# Patient Record
Sex: Female | Born: 1970
Health system: Southern US, Community
[De-identification: ages and names within clinical notes are randomized; demographics above are authoritative.]

## PROBLEM LIST (undated history)

## (undated) ENCOUNTER — Ambulatory Visit (HOSPITAL_COMMUNITY): Admission: EM | Payer: 59 | Source: Home / Self Care

## (undated) DIAGNOSIS — R011 Cardiac murmur, unspecified: Secondary | ICD-10-CM

## (undated) DIAGNOSIS — D649 Anemia, unspecified: Secondary | ICD-10-CM

## (undated) DIAGNOSIS — G43909 Migraine, unspecified, not intractable, without status migrainosus: Secondary | ICD-10-CM

## (undated) DIAGNOSIS — F329 Major depressive disorder, single episode, unspecified: Secondary | ICD-10-CM

## (undated) DIAGNOSIS — F32A Depression, unspecified: Secondary | ICD-10-CM

## (undated) DIAGNOSIS — I10 Essential (primary) hypertension: Secondary | ICD-10-CM

## (undated) DIAGNOSIS — T7840XA Allergy, unspecified, initial encounter: Secondary | ICD-10-CM

## (undated) HISTORY — DX: Migraine, unspecified, not intractable, without status migrainosus: G43.909

## (undated) HISTORY — DX: Allergy, unspecified, initial encounter: T78.40XA

## (undated) HISTORY — DX: Cardiac murmur, unspecified: R01.1

## (undated) HISTORY — DX: Essential (primary) hypertension: I10

## (undated) HISTORY — DX: Anemia, unspecified: D64.9

## (undated) HISTORY — PX: APPENDECTOMY: SHX54

## (undated) HISTORY — PX: WISDOM TOOTH EXTRACTION: SHX21

---

## 1998-12-08 ENCOUNTER — Inpatient Hospital Stay (HOSPITAL_COMMUNITY): Admission: EM | Admit: 1998-12-08 | Discharge: 1998-12-10 | Payer: Self-pay | Admitting: Emergency Medicine

## 1998-12-08 ENCOUNTER — Encounter (INDEPENDENT_AMBULATORY_CARE_PROVIDER_SITE_OTHER): Payer: Self-pay | Admitting: Specialist

## 1999-06-19 ENCOUNTER — Emergency Department (HOSPITAL_COMMUNITY): Admission: EM | Admit: 1999-06-19 | Discharge: 1999-06-19 | Payer: Self-pay | Admitting: Emergency Medicine

## 2001-03-05 ENCOUNTER — Other Ambulatory Visit: Admission: RE | Admit: 2001-03-05 | Discharge: 2001-03-05 | Payer: Self-pay | Admitting: Obstetrics & Gynecology

## 2002-03-15 ENCOUNTER — Other Ambulatory Visit: Admission: RE | Admit: 2002-03-15 | Discharge: 2002-03-15 | Payer: Self-pay | Admitting: Obstetrics & Gynecology

## 2003-03-20 ENCOUNTER — Other Ambulatory Visit: Admission: RE | Admit: 2003-03-20 | Discharge: 2003-03-20 | Payer: Self-pay | Admitting: Obstetrics & Gynecology

## 2004-02-27 ENCOUNTER — Encounter: Admission: RE | Admit: 2004-02-27 | Discharge: 2004-02-27 | Payer: Self-pay | Admitting: Obstetrics & Gynecology

## 2007-01-03 ENCOUNTER — Emergency Department (HOSPITAL_COMMUNITY): Admission: EM | Admit: 2007-01-03 | Discharge: 2007-01-03 | Payer: Self-pay | Admitting: *Deleted

## 2007-01-09 ENCOUNTER — Emergency Department (HOSPITAL_COMMUNITY): Admission: EM | Admit: 2007-01-09 | Discharge: 2007-01-09 | Payer: Self-pay | Admitting: Emergency Medicine

## 2007-01-23 ENCOUNTER — Encounter: Admission: RE | Admit: 2007-01-23 | Discharge: 2007-01-23 | Payer: Self-pay | Admitting: Neurosurgery

## 2007-06-13 ENCOUNTER — Emergency Department (HOSPITAL_COMMUNITY): Admission: EM | Admit: 2007-06-13 | Discharge: 2007-06-13 | Payer: Self-pay | Admitting: Family Medicine

## 2008-03-22 ENCOUNTER — Emergency Department (HOSPITAL_COMMUNITY): Admission: EM | Admit: 2008-03-22 | Discharge: 2008-03-22 | Payer: Self-pay | Admitting: Emergency Medicine

## 2008-09-18 ENCOUNTER — Ambulatory Visit: Payer: Self-pay | Admitting: General Practice

## 2008-11-15 ENCOUNTER — Ambulatory Visit: Payer: Self-pay | Admitting: Obstetrics and Gynecology

## 2010-08-02 ENCOUNTER — Ambulatory Visit: Payer: Self-pay | Admitting: Unknown Physician Specialty

## 2010-09-24 ENCOUNTER — Ambulatory Visit: Payer: Self-pay | Admitting: Unknown Physician Specialty

## 2010-11-09 ENCOUNTER — Emergency Department (HOSPITAL_COMMUNITY): Payer: 59

## 2010-11-09 ENCOUNTER — Observation Stay (HOSPITAL_COMMUNITY)
Admission: EM | Admit: 2010-11-09 | Discharge: 2010-11-10 | Disposition: A | Discharge status: Home / Self Care | Payer: 59 | Attending: Family Medicine | Admitting: Family Medicine

## 2010-11-09 DIAGNOSIS — R51 Headache: Secondary | ICD-10-CM | POA: Insufficient documentation

## 2010-11-09 DIAGNOSIS — Y92410 Unspecified street and highway as the place of occurrence of the external cause: Secondary | ICD-10-CM | POA: Insufficient documentation

## 2010-11-09 DIAGNOSIS — N289 Disorder of kidney and ureter, unspecified: Secondary | ICD-10-CM | POA: Insufficient documentation

## 2010-11-09 DIAGNOSIS — E860 Dehydration: Secondary | ICD-10-CM | POA: Insufficient documentation

## 2010-11-09 DIAGNOSIS — R55 Syncope and collapse: Principal | ICD-10-CM | POA: Insufficient documentation

## 2010-11-09 DIAGNOSIS — F411 Generalized anxiety disorder: Secondary | ICD-10-CM | POA: Insufficient documentation

## 2010-11-09 DIAGNOSIS — Z79899 Other long term (current) drug therapy: Secondary | ICD-10-CM | POA: Insufficient documentation

## 2010-11-09 LAB — URINALYSIS, ROUTINE W REFLEX MICROSCOPIC
Bilirubin Urine: NEGATIVE
Glucose, UA: NEGATIVE mg/dL
Ketones, ur: NEGATIVE mg/dL
Nitrite: NEGATIVE
Protein, ur: NEGATIVE mg/dL
Specific Gravity, Urine: 1.016 (ref 1.005–1.030)
Urobilinogen, UA: 0.2 mg/dL (ref 0.0–1.0)
pH: 5.5 (ref 5.0–8.0)

## 2010-11-09 LAB — TROPONIN I: Troponin I: <0.3 ng/mL (ref <0.30)

## 2010-11-09 LAB — POCT I-STAT, CHEM 8
BUN: 14 mg/dL (ref 6–23)
Calcium, Ion: 1.13 mmol/L (ref 1.12–1.32)
Chloride: 101 mEq/L (ref 96–112)
Creatinine, Ser: 1.2 mg/dL — ABNORMAL HIGH (ref 0.50–1.10)
Glucose, Bld: 86 mg/dL (ref 70–99)
HCT: 43 % (ref 36.0–46.0)
Hemoglobin: 14.6 g/dL (ref 12.0–15.0)
Potassium: 3.5 mEq/L (ref 3.5–5.1)
Sodium: 139 mEq/L (ref 135–145)
TCO2: 28 mmol/L (ref 0–100)

## 2010-11-09 LAB — D-DIMER, QUANTITATIVE: D-Dimer, Quant: 0.35 ug/mL-FEU (ref 0.00–0.48)

## 2010-11-09 LAB — CK TOTAL AND CKMB (NOT AT ARMC)
CK, MB: 2.5 ng/mL (ref 0.3–4.0)
Relative Index: 1.8 (ref 0.0–2.5)
Total CK: 141 U/L (ref 7–177)

## 2010-11-09 LAB — POCT PREGNANCY, URINE: Preg Test, Ur: NEGATIVE

## 2010-11-09 LAB — URINE MICROSCOPIC-ADD ON

## 2010-11-10 DIAGNOSIS — R55 Syncope and collapse: Secondary | ICD-10-CM

## 2010-11-10 LAB — COMPREHENSIVE METABOLIC PANEL
ALT: 16 U/L (ref 0–35)
ALT: 11 U/L (ref 0–35)
AST: 27 U/L (ref 0–37)
AST: 12 U/L (ref 0–37)
Albumin: 4 g/dL (ref 3.5–5.2)
Albumin: 3.3 g/dL — ABNORMAL LOW (ref 3.5–5.2)
Alkaline Phosphatase: 62 U/L (ref 39–117)
Alkaline Phosphatase: 54 U/L (ref 39–117)
BUN: 16 mg/dL (ref 6–23)
BUN: 14 mg/dL (ref 6–23)
CO2: 24 mEq/L (ref 19–32)
CO2: 27 mEq/L (ref 19–32)
Calcium: 9.3 mg/dL (ref 8.4–10.5)
Calcium: 8.7 mg/dL (ref 8.4–10.5)
Chloride: 99 mEq/L (ref 96–112)
Chloride: 103 mEq/L (ref 96–112)
Creatinine, Ser: 1.02 mg/dL (ref 0.50–1.10)
Creatinine, Ser: 0.83 mg/dL (ref 0.50–1.10)
GFR calc Af Amer: >60 mL/min (ref >60)
GFR calc Af Amer: >60 mL/min (ref >60)
GFR calc non Af Amer: 59 mL/min — ABNORMAL LOW (ref >60)
GFR calc non Af Amer: >60 mL/min (ref >60)
Glucose, Bld: 113 mg/dL — ABNORMAL HIGH (ref 70–99)
Glucose, Bld: 93 mg/dL (ref 70–99)
Potassium: 3.6 mEq/L (ref 3.5–5.1)
Potassium: 3.1 mEq/L — ABNORMAL LOW (ref 3.5–5.1)
Sodium: 139 mEq/L (ref 135–145)
Sodium: 138 mEq/L (ref 135–145)
Total Bilirubin: 0.2 mg/dL — ABNORMAL LOW (ref 0.3–1.2)
Total Bilirubin: 0.5 mg/dL (ref 0.3–1.2)
Total Protein: 7.6 g/dL (ref 6.0–8.3)
Total Protein: 6.5 g/dL (ref 6.0–8.3)

## 2010-11-10 LAB — MAGNESIUM
Magnesium: 2.1 mg/dL (ref 1.5–2.5)
Magnesium: 2 mg/dL (ref 1.5–2.5)

## 2010-11-10 LAB — CARDIAC PANEL(CRET KIN+CKTOT+MB+TROPI)
CK, MB: 2.3 ng/mL (ref 0.3–4.0)
CK, MB: 2 ng/mL (ref 0.3–4.0)
CK, MB: 2.1 ng/mL (ref 0.3–4.0)
Relative Index: 1.6 (ref 0.0–2.5)
Relative Index: INVALID (ref 0.0–2.5)
Relative Index: 1.8 (ref 0.0–2.5)
Total CK: 142 U/L (ref 7–177)
Total CK: 99 U/L (ref 7–177)
Total CK: 118 U/L (ref 7–177)
Troponin I: <0.3 ng/mL (ref <0.30)
Troponin I: <0.3 ng/mL (ref <0.30)
Troponin I: <0.3 ng/mL (ref <0.30)
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
## 2011-07-30 ENCOUNTER — Ambulatory Visit: Payer: Self-pay | Admitting: Unknown Physician Specialty

## 2011-07-30 LAB — CREATININE, SERUM
EGFR (African American): >60
EGFR (Non-African Amer.): >60

## 2012-08-12 ENCOUNTER — Encounter: Payer: Self-pay | Admitting: Obstetrics & Gynecology

## 2012-09-02 ENCOUNTER — Encounter: Payer: Self-pay | Admitting: Obstetrics & Gynecology

## 2012-09-02 ENCOUNTER — Ambulatory Visit (INDEPENDENT_AMBULATORY_CARE_PROVIDER_SITE_OTHER): Payer: 59 | Admitting: Obstetrics & Gynecology

## 2012-09-02 VITALS — BP 120/78 | HR 65 | Temp 98.2°F | Ht 64.0 in | Wt 167.8 lb

## 2012-09-02 DIAGNOSIS — Z30430 Encounter for insertion of intrauterine contraceptive device: Secondary | ICD-10-CM

## 2012-09-02 DIAGNOSIS — Z3202 Encounter for pregnancy test, result negative: Secondary | ICD-10-CM

## 2012-09-02 DIAGNOSIS — Z30433 Encounter for removal and reinsertion of intrauterine contraceptive device: Secondary | ICD-10-CM | POA: Insufficient documentation

## 2012-09-02 LAB — POCT URINE PREGNANCY: Preg Test, Ur: NEGATIVE

## 2012-09-02 NOTE — Progress Notes (Deleted)
.   Subjective:     Renee Li is a 42 y.o. female here for a birth control consult.   She had her current Mirena inserted in 2009 and would like a new one placed.  Personal health questionnaire reviewed: {yes/no:9010}.   Gynecologic History No LMP recorded. Patient is not currently having periods (Reason: IUD). Contraception: IUD Last Pap: 2011. Results were: normal Last mammogram: 2011. Results were: normal  Obstetric History OB History   Grav Para Term Preterm Abortions TAB SAB Ect Mult Living                   {Common ambulatory SmartLinks:19316}  Review of Systems {ros; complete:30496}    Objective:    {exam; complete:18323}    Assessment:    Healthy female exam.    Plan:    {plan:19193}

## 2012-09-02 NOTE — Progress Notes (Signed)
IUD Insertion Procedure Note  Pre-operative Diagnosis: Mirena insitu x 5 yrs  Post-operative Diagnosis: same  Indications: contraception  Procedure Details   The risks (including infection, bleeding, pain, and uterine perforation) and benefits of the procedure were explained to the patient and Written informed consent was obtained.    Cervix cleansed with Betadine. The IUD was removed intact.  Uterus sounded to 7 cm. IUD inserted without difficulty. String visible and trimmed. Patient tolerated procedure well.  An informal pelvic U/S confirmed appropiate positioning    Condition: Stable  Complications: None  Plan:  The patient was advised to call for any fever or for prolonged or severe pain or bleeding. She was advised to use OTC acetaminophen as needed for mild to moderate pain.

## 2012-09-02 NOTE — Patient Instructions (Addendum)
Levonorgestrel intrauterine device (IUD) What is this medicine? LEVONORGESTREL IUD (LEE voe nor jes trel) is a contraceptive (birth control) device. The device is placed inside the uterus by a healthcare professional. It is used to prevent pregnancy and can also be used to treat heavy bleeding that occurs during your period. Depending on the device, it can be used for 3 to 5 years. This medicine may be used for other purposes; ask your health care provider or pharmacist if you have questions. What should I tell my health care provider before I take this medicine? They need to know if you have any of these conditions: -abnormal Pap smear -cancer of the breast, uterus, or cervix -diabetes -endometritis -genital or pelvic infection now or in the past -have more than one sexual partner or your partner has more than one partner -heart disease -history of an ectopic or tubal pregnancy -immune system problems -IUD in place -liver disease or tumor -problems with blood clots or take blood-thinners -use intravenous drugs -uterus of unusual shape -vaginal bleeding that has not been explained -an unusual or allergic reaction to levonorgestrel, other hormones, silicone, or polyethylene, medicines, foods, dyes, or preservatives -pregnant or trying to get pregnant -breast-feeding How should I use this medicine? This device is placed inside the uterus by a health care professional. Talk to your pediatrician regarding the use of this medicine in children. Special care may be needed. Overdosage: If you think you have taken too much of this medicine contact a poison control center or emergency room at once. NOTE: This medicine is only for you. Do not share this medicine with others. What if I miss a dose? This does not apply. What may interact with this medicine? Do not take this medicine with any of the following medications: -amprenavir -bosentan -fosamprenavir This medicine may also interact with  the following medications: -aprepitant -barbiturate medicines for inducing sleep or treating seizures -bexarotene -griseofulvin -medicines to treat seizures like carbamazepine, ethotoin, felbamate, oxcarbazepine, phenytoin, topiramate -modafinil -pioglitazone -rifabutin -rifampin -rifapentine -some medicines to treat HIV infection like atazanavir, indinavir, lopinavir, nelfinavir, tipranavir, ritonavir -St. John's wort -warfarin This list may not describe all possible interactions. Give your health care provider a list of all the medicines, herbs, non-prescription drugs, or dietary supplements you use. Also tell them if you smoke, drink alcohol, or use illegal drugs. Some items may interact with your medicine. What should I watch for while using this medicine? Visit your doctor or health care professional for regular check ups. See your doctor if you or your partner has sexual contact with others, becomes HIV positive, or gets a sexual transmitted disease. This product does not protect you against HIV infection (AIDS) or other sexually transmitted diseases. You can check the placement of the IUD yourself by reaching up to the top of your vagina with clean fingers to feel the threads. Do not pull on the threads. It is a good habit to check placement after each menstrual period. Call your doctor right away if you feel more of the IUD than just the threads or if you cannot feel the threads at all. The IUD may come out by itself. You may become pregnant if the device comes out. If you notice that the IUD has come out use a backup birth control method like condoms and call your health care provider. Using tampons will not change the position of the IUD and are okay to use during your period. What side effects may I notice from receiving this medicine?   Side effects that you should report to your doctor or health care professional as soon as possible: -allergic reactions like skin rash, itching or  hives, swelling of the face, lips, or tongue -fever, flu-like symptoms -genital sores -high blood pressure -no menstrual period for 6 weeks during use -pain, swelling, warmth in the leg -pelvic pain or tenderness -severe or sudden headache -signs of pregnancy -stomach cramping -sudden shortness of breath -trouble with balance, talking, or walking -unusual vaginal bleeding, discharge -yellowing of the eyes or skin Side effects that usually do not require medical attention (report to your doctor or health care professional if they continue or are bothersome): -acne -breast pain -change in sex drive or performance -changes in weight -cramping, dizziness, or faintness while the device is being inserted -headache -irregular menstrual bleeding within first 3 to 6 months of use -nausea This list may not describe all possible side effects. Call your doctor for medical advice about side effects. You may report side effects to FDA at 1-800-FDA-1088. Where should I keep my medicine? This does not apply. NOTE: This sheet is a summary. It may not cover all possible information. If you have questions about this medicine, talk to your doctor, pharmacist, or health care provider.  2013, [employer]. (05/01/2011 1:54:04 PM)  

## 2012-11-18 ENCOUNTER — Ambulatory Visit: Payer: 59 | Admitting: Obstetrics & Gynecology

## 2013-04-09 ENCOUNTER — Encounter (HOSPITAL_COMMUNITY): Payer: Self-pay | Admitting: *Deleted

## 2013-04-09 ENCOUNTER — Encounter (HOSPITAL_COMMUNITY): Payer: Self-pay | Admitting: Emergency Medicine

## 2013-04-09 ENCOUNTER — Inpatient Hospital Stay (HOSPITAL_COMMUNITY)
Admission: AD | Admit: 2013-04-09 | Discharge: 2013-04-12 | DRG: 885 | Disposition: A | Discharge status: Home / Self Care | Payer: 59 | Source: Intra-hospital | Attending: Psychiatry | Admitting: Psychiatry

## 2013-04-09 ENCOUNTER — Emergency Department (HOSPITAL_COMMUNITY)
Admission: EM | Admit: 2013-04-09 | Discharge: 2013-04-09 | Disposition: A | Discharge status: Other Acute Inpatient Hospital | Payer: 59 | Attending: Emergency Medicine | Admitting: Emergency Medicine

## 2013-04-09 DIAGNOSIS — Z79899 Other long term (current) drug therapy: Secondary | ICD-10-CM

## 2013-04-09 DIAGNOSIS — F332 Major depressive disorder, recurrent severe without psychotic features: Principal | ICD-10-CM | POA: Diagnosis present

## 2013-04-09 DIAGNOSIS — Z0289 Encounter for other administrative examinations: Secondary | ICD-10-CM | POA: Insufficient documentation

## 2013-04-09 DIAGNOSIS — R413 Other amnesia: Secondary | ICD-10-CM | POA: Diagnosis present

## 2013-04-09 DIAGNOSIS — F32A Depression, unspecified: Secondary | ICD-10-CM

## 2013-04-09 DIAGNOSIS — F3289 Other specified depressive episodes: Secondary | ICD-10-CM

## 2013-04-09 DIAGNOSIS — R634 Abnormal weight loss: Secondary | ICD-10-CM | POA: Diagnosis present

## 2013-04-09 DIAGNOSIS — R45851 Suicidal ideations: Secondary | ICD-10-CM

## 2013-04-09 DIAGNOSIS — F329 Major depressive disorder, single episode, unspecified: Secondary | ICD-10-CM

## 2013-04-09 DIAGNOSIS — I10 Essential (primary) hypertension: Secondary | ICD-10-CM | POA: Diagnosis present

## 2013-04-09 HISTORY — DX: Major depressive disorder, single episode, unspecified: F32.9

## 2013-04-09 HISTORY — DX: Depression, unspecified: F32.A

## 2013-04-09 LAB — CBC WITH DIFFERENTIAL/PLATELET
Basophils Absolute: 0 10*3/uL (ref 0.0–0.1)
Basophils Relative: 0 % (ref 0–1)
Eosinophils Absolute: 0 10*3/uL (ref 0.0–0.7)
Eosinophils Relative: 0 % (ref 0–5)
HCT: 44 % (ref 36.0–46.0)
MCH: 30.7 pg (ref 26.0–34.0)
MCHC: 35.2 g/dL (ref 30.0–36.0)
Monocytes Absolute: 0.6 10*3/uL (ref 0.1–1.0)
Monocytes Relative: 6 % (ref 3–12)
Neutro Abs: 7.6 10*3/uL (ref 1.7–7.7)
Platelets: 222 10*3/uL (ref 150–400)
RDW: 12.7 % (ref 11.5–15.5)

## 2013-04-09 LAB — BASIC METABOLIC PANEL
BUN: 11 mg/dL (ref 6–23)
Calcium: 9.8 mg/dL (ref 8.4–10.5)
Chloride: 98 mEq/L (ref 96–112)
Creatinine, Ser: 1.05 mg/dL (ref 0.50–1.10)
GFR calc Af Amer: 74 mL/min — ABNORMAL LOW (ref >90)
GFR calc non Af Amer: 64 mL/min — ABNORMAL LOW (ref >90)
Sodium: 137 mEq/L (ref 135–145)

## 2013-04-09 LAB — SALICYLATE LEVEL: Salicylate Lvl: <2 mg/dL — ABNORMAL LOW (ref 2.8–20.0)

## 2013-04-09 LAB — RAPID URINE DRUG SCREEN, HOSP PERFORMED
Amphetamines: NOT DETECTED
Opiates: NOT DETECTED

## 2013-04-09 LAB — ETHANOL: Alcohol, Ethyl (B): <11 mg/dL (ref 0–11)

## 2013-04-09 LAB — ACETAMINOPHEN LEVEL: Acetaminophen (Tylenol), Serum: <15 ug/mL (ref 10–30)

## 2013-04-09 MED ORDER — TRAZODONE HCL 50 MG PO TABS
50.0000 mg | ORAL_TABLET | Freq: Every evening | ORAL | Status: DC | PRN
  Administered 2013-04-11: 50 mg via ORAL
  Filled 2013-04-09: qty 1

## 2013-04-09 MED ORDER — MAGNESIUM HYDROXIDE 400 MG/5ML PO SUSP: 30.0000 mL | Freq: Every day | ORAL | Status: DC | PRN

## 2013-04-09 MED ORDER — BUPROPION HCL ER (XL) 150 MG PO TB24: 150.0000 mg | ORAL_TABLET | Freq: Every day | ORAL | Status: DC

## 2013-04-09 MED ORDER — HYDROCHLOROTHIAZIDE 25 MG PO TABS: 25.0000 mg | ORAL_TABLET | Freq: Every day | ORAL | Status: DC

## 2013-04-09 MED ORDER — ACETAMINOPHEN 325 MG PO TABS
650.0000 mg | ORAL_TABLET | Freq: Four times a day (QID) | ORAL | Status: DC | PRN
  Administered 2013-04-10 (×2): 650 mg via ORAL
  Filled 2013-04-09 (×2): qty 2

## 2013-04-09 MED ORDER — ATENOLOL 50 MG PO TABS: 50.0000 mg | ORAL_TABLET | Freq: Every day | ORAL | Status: DC

## 2013-04-09 MED ORDER — ZOLPIDEM TARTRATE 10 MG PO TABS: 10.0000 mg | ORAL_TABLET | Freq: Every evening | ORAL | Status: DC | PRN

## 2013-04-09 MED ORDER — ALUM & MAG HYDROXIDE-SIMETH 200-200-20 MG/5ML PO SUSP: 30.0000 mL | ORAL | Status: DC | PRN

## 2013-04-09 NOTE — ED Provider Notes (Signed)
CSN: [id number]     Arrival date & time 04/09/13  4098 History   First MD Initiated Contact with Patient 04/09/13 [phone]     Chief Complaint  Patient presents with  . Medical Clearance   (Consider location/radiation/quality/duration/timing/severity/associated sxs/prior Treatment) HPI  This a 42 year old female with history of depression who presents with suicidal ideation. Patient reports stressors at home including a recent separation from her husband. She is taking Wellbutrin and Ambien. Patient states that last night "my head tell me to take all my Ambien again asleep." She also states that her head tell vertical and to a lake. She states that she does not want her herself and flushed all of her Ambien down the toilet. She called a crisis hotline and contracted for safety with a woman named Bridgette. Part of her safety plan was to come to the ER she had recurrence of these thoughts period.  Patient states that she's had suicidal thoughts in the past but has had no suicidal gestures. She denies any alcohol or drug use. She is. Emotional and crying in triage. She states that she lost her husband and child and does not want to herself.  Past Medical History  Diagnosis Date  . Hypertension   . Heart murmur    Past Surgical History  Procedure Laterality Date  . Appendectomy     Family History  Problem Relation Age of Onset  . Cancer Mother     throat  . Stroke Maternal Grandmother   . Diabetes Maternal Grandmother   . Cancer Maternal Grandfather     prostate  . Heart disease Paternal Grandmother    History  Substance Use Topics  . Smoking status: Never Smoker   . Smokeless tobacco: Not on file  . Alcohol Use: No   OB History   Grav Para Term Preterm Abortions TAB SAB Ect Mult Living                 Review of Systems  Constitutional: Negative for fever.  Respiratory: Negative for cough, chest tightness and shortness of breath.   Cardiovascular: Negative for chest pain.   Gastrointestinal: Negative for nausea, vomiting and abdominal pain.  Genitourinary: Negative for dysuria.  Musculoskeletal: Negative for back pain.  Skin: Negative for wound.  Neurological: Negative for headaches.  Psychiatric/Behavioral: Positive for suicidal ideas.  All other systems reviewed and are negative.    Allergies  Aleve and Ibuprofen  Home Medications   Current Outpatient Rx  Name  Route  Sig  Dispense  Refill  . atenolol (TENORMIN) 50 MG tablet   Oral   Take 50 mg by mouth daily.         Marland Kitchen buPROPion (WELLBUTRIN XL) 150 MG 24 hr tablet   Oral   Take 150 mg by mouth daily.         . hydrochlorothiazide (HYDRODIURIL) 25 MG tablet   Oral   Take 25 mg by mouth daily.         Marland Kitchen zolpidem (AMBIEN) 10 MG tablet   Oral   Take 10 mg by mouth at bedtime as needed for sleep.          BP 135/98  Pulse 90  Temp(Src) 99.5 F (37.5 C) (Oral)  Resp 20  SpO2 99% Physical Exam  Nursing note and vitals reviewed. Constitutional: She is oriented to person, place, and time. No distress.  Tearful  HENT:  Head: Normocephalic and atraumatic.  Cardiovascular: Normal rate, regular rhythm and normal heart sounds.  No murmur heard. Pulmonary/Chest: Effort normal. No respiratory distress. She has no wheezes.  Neurological: She is alert and oriented to person, place, and time.  Skin: Skin is warm and dry.  Psychiatric:  Rational with reasonable thought content    ED Course  Procedures (including critical care time) Labs Review Labs Reviewed  CBC WITH DIFFERENTIAL  BASIC METABOLIC PANEL  URINE RAPID DRUG SCREEN (HOSP PERFORMED)  ETHANOL  SALICYLATE LEVEL  ACETAMINOPHEN LEVEL   Imaging Review No results found.  EKG Interpretation   None       MDM  No diagnosis found.  Patient presents with acute suicidal ideation in the setting of recent stressors. She is followed through with her safety plan and is presented to the ED because of her current thoughts  of suicidal ideation. She has not made any suicidal gestures. She is currently here voluntarily and is seeking help. She is very tearful.  She physically appears well. Psych orders were placed and medical screening labs obtained.  Patient is currently here voluntarily but I discussed with her that she would need to have IVC papers taken out on her if she attempts to leave.   Shon Baton, MD 04/09/13 [phone]

## 2013-04-09 NOTE — ED Notes (Addendum)
Patient's silver colored heart necklace placed in cup in patient's black purse in belonging bag.

## 2013-04-09 NOTE — ED Notes (Signed)
Report given to [employer] pt to go to room 43 at 1015

## 2013-04-09 NOTE — ED Notes (Signed)
States she doesn't have A hallucinations, her mind races.
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
## 2013-04-09 NOTE — ED Notes (Signed)
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 She states she really does not want to hurt self, Stress due to separation with husband, states she flushed her Ambien down the toilet last night to keep from taking them. States she loves her children and husband and wants help, pt will stay voluntarily but understands that if she tries to leave the EDP will take out papers. Pt emotional and crying in triage.

## 2013-04-09 NOTE — ED Notes (Signed)
Patient has one belonging bag at desk

## 2013-04-09 NOTE — BH Assessment (Signed)
Assessment Note  Renee [name].   Pt tearful, flat affect, Ox 3, speech clear but soft, anxious.    What led to ED visit  Pt contacted hotline and was recommended to come to ED for evaluation.  Pt had feelings of suicide and had an initial plan to OD but then poured meds down the toilet and called therapist.  Pt has other meds in house.  Pt can't reliably contract for safety at this time and is scared to be alone.  "I don't know what I will do if I go home.  I know suicide is a sin but I am tired of being like this."  Trigger  Pt mother died 2 years ago and she was POA.  She has other sisters and an older sister who has not spoken to pt since funeral.  Mother was in Hospice and had a DNR and did not want to be on a feeding tube.  She kept pulling it out.  Pt gave OK for them stop life support treatment.  Pt blames self for mother dying although mother wanted to be disconnected.  3 months ago, pt spouse separated from her after 3 years of marriage.  Pt said he told me "Stop it.  You need to snap out this.  Just change your life."  "He never understood, never offered support and so he left and now I am alone."    Pt has 41 year old daughter who is currently with her father.  "I don't think I would kill myself because of what it would do to my child, but I can't take this anymore.  I need help."  No SA, AVH or HI related issues currently or past hx of.  Pt in talk therapy with Dr. Lady Gary.    Recommendation  Pt may benefit from intpx due to inability to contract reliably for safety and having a plan to overdose earlier in the day.  Pt has numerous stressors contributing to current safety issues.  Psychiatry to round and determine dispo.  Axis I: Generalized Anxiety Disorder and Major Depression, Recurrent severe Axis II: Deferred Axis III:  Past Medical History  Diagnosis Date  . Hypertension   . Heart murmur    Axis IV: other psychosocial or environmental  problems, problems related to social environment and problems with primary support group Axis V: 41-50 serious symptoms  Past Medical History:  Past Medical History  Diagnosis Date  . Hypertension   . Heart murmur     Past Surgical History  Procedure Laterality Date  . Appendectomy      Family History:  Family History  Problem Relation Age of Onset  . Cancer Mother     throat  . Stroke Maternal Grandmother   . Diabetes Maternal Grandmother   . Cancer Maternal Grandfather     prostate  . Heart disease Paternal Grandmother     Social History:  reports that she has never smoked. She does not have any smokeless tobacco history on file. She reports that she does not drink alcohol or use illicit drugs.  Additional Social History:  Alcohol / Drug Use Pain Medications: na Prescriptions: na Over the Counter: na History of alcohol / drug use?: No history of alcohol / drug abuse  CIWA: CIWA-Ar BP: 133/87 mmHg Pulse Rate: 66 COWS:    Allergies:  Allergies  Allergen Reactions  . Aleve [Naproxen]     unknown  . Ibuprofen     unknown  Home Medications:  (Not in a hospital admission)  OB/GYN Status:  No LMP recorded. Patient is not currently having periods (Reason: IUD).  General Assessment Data Location of Assessment: [facility] Is this a Tele or Face-to-Face Assessment?: Face-to-Face Is this an Initial Assessment or a Re-assessment for this encounter?: Initial Assessment Living Arrangements: Alone Can pt return to current living arrangement?: Yes Admission Status: Voluntary Is patient capable of signing voluntary admission?: Yes Transfer from: Acute Hospital Referral Source: MD  Medical Screening Exam [facility] Walk-in ONLY) Medical Exam completed: Yes  [facility] Crisis Care Plan Living Arrangements: Alone Name of Psychiatrist: Dr. Lady Gary Name of Therapist: Dr. Lady Gary  Education Status Is patient currently in school?: No Current Grade: na Highest grade of school  patient has completed: na Name of school: na Contact person: na  Risk to self Suicidal Ideation: Yes-Currently Present Suicidal Intent: No-Not Currently/Within Last 6 Months Is patient at risk for suicide?: Yes Suicidal Plan?: No-Not Currently/Within Last 6 Months Access to Means: Yes Specify Access to Suicidal Means: has access to medication What has been your use of drugs/alcohol within the last 12 months?: na Previous Attempts/Gestures: No How many times?: 0 Other Self Harm Risks: na Triggers for Past Attempts: Other (Comment) (depression) Intentional Self Injurious Behavior: None Family Suicide History: No Recent stressful life event(s): Trauma (Comment) (POA for mother; pulled plug in respect for DNR;) Persecutory voices/beliefs?: No Depression: Yes Depression Symptoms: Insomnia;Tearfulness;Isolating;Fatigue;Guilt;Loss of interest in usual pleasures;Feeling worthless/self pity;Feeling angry/irritable Substance abuse history and/or treatment for substance abuse?: No Suicide prevention information given to non-admitted patients: Not applicable  Risk to Others Homicidal Ideation: No Thoughts of Harm to Others: No Current Homicidal Intent: No Current Homicidal Plan: No Access to Homicidal Means: No Identified Victim: na History of harm to others?: No Assessment of Violence: None Noted Violent Behavior Description: cooperative Does patient have access to weapons?: No Criminal Charges Pending?: No Does patient have a court date: No  Psychosis Hallucinations: None noted Delusions: None noted  Mental Status Report Appear/Hygiene: Disheveled Eye Contact: Fair Motor Activity: Unremarkable Speech: Soft;Logical/coherent Level of Consciousness: Alert;Crying Mood: Depressed;Anxious;Ashamed/humiliated;Despair;Sad;Worthless, low self-esteem Affect: Anxious;Depressed;Sad Anxiety Level: Severe Thought Processes: Coherent Judgement: Impaired Orientation:  Person;Place;Situation;Appropriate for developmental age Obsessive Compulsive Thoughts/Behaviors: Minimal  Cognitive Functioning Concentration: Decreased Memory: Recent Intact;Remote Intact IQ: Average Insight: Poor Impulse Control: Poor Appetite: Fair Weight Loss: 0 Weight Gain: 0 Sleep: Decreased Total Hours of Sleep: 4 Vegetative Symptoms: None  ADLScreening [facility] Assessment Services) Patient's cognitive ability adequate to safely complete daily activities?: Yes Patient able to express need for assistance with ADLs?: Yes Independently performs ADLs?: Yes (appropriate for developmental age)  Prior Inpatient Therapy Prior Inpatient Therapy: No Prior Therapy Dates: na Prior Therapy Facilty/Provider(s): na Reason for Treatment: na  Prior Outpatient Therapy Prior Outpatient Therapy: Yes Prior Therapy Dates: current Prior Therapy Facilty/Provider(s): Dr. Lady Gary Reason for Treatment: talk therapy and med mgt  ADL Screening (condition at time of admission) Patient's cognitive ability adequate to safely complete daily activities?: Yes Is the patient deaf or have difficulty hearing?: No Does the patient have difficulty seeing, even when wearing glasses/contacts?: No Does the patient have difficulty concentrating, remembering, or making decisions?: No Patient able to express need for assistance with ADLs?: Yes Does the patient have difficulty dressing or bathing?: No Independently performs ADLs?: Yes (appropriate for developmental age) Does the patient have difficulty walking or climbing stairs?: No Weakness of Legs: None Weakness of Arms/Hands: None  Home Assistive Devices/Equipment Home Assistive Devices/Equipment: None  Therapy Consults (  therapy consults require a physician order) PT Evaluation Needed: No OT Evalulation Needed: No SLP Evaluation Needed: No Abuse/Neglect Assessment (Assessment to be complete while patient is alone) Physical Abuse: Denies Verbal Abuse:  Yes, past (Comment) (ex husband is not understanding of depression) Sexual Abuse: Denies Exploitation of patient/patient's resources: Denies Self-Neglect: Denies Values / Beliefs Cultural Requests During Hospitalization: None Spiritual Requests During Hospitalization: None Consults Spiritual Care Consult Needed: No Social Work Consult Needed: No Merchant navy officer (For Healthcare) Advance Directive: Patient does not have advance directive Pre-existing out of facility DNR order (yellow form or pink MOST form): No Nutrition Screen- [facility] Patient's home diet: Regular  Additional Information 1:1 In Past 12 Months?: No CIRT Risk: No Elopement Risk: No Does patient have medical clearance?: Yes     Disposition:  Disposition Initial Assessment Completed for this Encounter: Yes Disposition of Patient: Inpatient treatment program Type of inpatient treatment program: Adult  On Site Evaluation by:   Reviewed with Physician:    Titus Mould, Eppie Gibson 04/09/2013 11:06 AM

## 2013-04-09 NOTE — ED Provider Notes (Signed)
Pt admitted to [facility].  Pt later stating she feels improved as good plan for outpt f/u, no longer suicidal, but in speaking w/ Dr. Wilkie Aye this afternoon and psych team that evaluated her, they did not feel comfortable with d/c.   1. Depression with suicidal ideation      Shanna Cisco, MD 04/09/13 2257

## 2013-04-09 NOTE — BH Assessment (Addendum)
[facility] Assessment Progress Note 04/09/13. 1830.  Went in to have pt sign admission paperwork and pt now reports she does not want to be admitted.  Pt states she is feeling better and would like to be discharged home.  Spoke with Barth Kirks at [facility] and she will ask oncoming extender to complete telepsych to evaluate pt request. Daleen Squibb, LCSW  04/09/13.  2030.  Discussed pt with Dr Kandyce Rud of [facility], who spoke with pt and reports that pt appears to have a reasonable safety plan at this time.  DR Kandyce Rud is requesting a [facility] extender reevaluate pt for another opinion.  This was passed on to Laverle Hobby at [facility]. Daleen Squibb, LCSW

## 2013-04-09 NOTE — Consult Note (Signed)
[facility] Face-to-Face Psychiatry Consult   Reason for Consult:  ED Referral Referring Physician: ED Providers/Dr Horton Diannia Ruder Arnette Renee [name].  Assessment: AXIS I:  Depression with suicidal ideation AXIS II:  Deferred AXIS III:   Past Medical History  Diagnosis Date  . Hypertension   . Heart murmur    AXIS IV:  problems with primary support group AXIS V:  21-30 behavior considerably influenced by delusions or hallucinations OR serious impairment in judgment, communication OR inability to function in almost all areas  Plan:  Recommend psychiatric Inpatient admission when medically cleared.  Subjective:   Renee Li is a 42 y.o. female patient admitted with intrusive suicidal thoughts since her husband told her their marriage was over last nite.She has hx of depression offf meds until 3 months ago and an RX for [place] which she thought about taking all of last nite but flushed them to control her intrusive thoughts.Marland Kitchen  HPI:  As above HPI Elements:   Context:  Per subjective and also see ED provider notes.  Past Psychiatric History: Past Medical History  Diagnosis Date  . Hypertension   . Heart murmur     reports that she has never smoked. She does not have any smokeless tobacco history on file. She reports that she does not drink alcohol or use illicit drugs. Family History  Problem Relation Age of Onset  . Cancer Mother     throat  . Stroke Maternal Grandmother   . Diabetes Maternal Grandmother   . Cancer Maternal Grandfather     prostate  . Heart disease Paternal Grandmother    Family History Substance Abuse: No Family Supports: No Living Arrangements: Alone Can pt return to current living arrangement?: Yes Abuse/Neglect [facility]) Physical Abuse: Denies Verbal Abuse: Yes, past (Comment) (ex husband is not understanding of depression) Sexual Abuse: Denies Allergies:   Allergies  Allergen Reactions  . Aleve [Naproxen]     unknown  . Ibuprofen      unknown    ACT Assessment Complete:  Yes:    Educational Status    Risk to Self: Risk to self Suicidal Ideation: Yes-Currently Present Suicidal Intent: No-Not Currently/Within Last 6 Months Is patient at risk for suicide?: Yes Suicidal Plan?: No-Not Currently/Within Last 6 Months Access to Means: Yes Specify Access to Suicidal Means: has access to medication What has been your use of drugs/alcohol within the last 12 months?: na Previous Attempts/Gestures: No How many times?: 0 Other Self Harm Risks: na Triggers for Past Attempts: Other (Comment) (depression) Intentional Self Injurious Behavior: None Family Suicide History: No Recent stressful life event(s): Trauma (Comment) (POA for mother; pulled plug in respect for DNR;) Persecutory voices/beliefs?: No Depression: Yes Depression Symptoms: Insomnia;Tearfulness;Isolating;Fatigue;Guilt;Loss of interest in usual pleasures;Feeling worthless/self pity;Feeling angry/irritable Substance abuse history and/or treatment for substance abuse?: No Suicide prevention information given to non-admitted patients: Not applicable  Risk to Others: Risk to Others Homicidal Ideation: No Thoughts of Harm to Others: No Current Homicidal Intent: No Current Homicidal Plan: No Access to Homicidal Means: No Identified Victim: na History of harm to others?: No Assessment of Violence: None Noted Violent Behavior Description: cooperative Does patient have access to weapons?: No Criminal Charges Pending?: No Does patient have a court date: No  Abuse: Abuse/Neglect Assessment (Assessment to be complete while patient is alone) Physical Abuse: Denies Verbal Abuse: Yes, past (Comment) (ex husband is not understanding of depression) Sexual Abuse: Denies Exploitation of patient/patient's resources: Denies Self-Neglect: Denies  Prior Inpatient Therapy:  Prior Inpatient Therapy Prior Inpatient Therapy: No Prior Therapy Dates: na Prior Therapy  Facilty/Provider(s): na Reason for Treatment: na  Prior Outpatient Therapy: Prior Outpatient Therapy Prior Outpatient Therapy: Yes Prior Therapy Dates: current Prior Therapy Facilty/Provider(s): Dr. Lady Gary Reason for Treatment: talk therapy and med mgt  Additional Information: Additional Information 1:1 In Past 12 Months?: No CIRT Risk: No Elopement Risk: No Does patient have medical clearance?: Yes                  Objective: Blood pressure 133/87, pulse 66, temperature 98.1 F (36.7 C), temperature source Oral, resp. rate 18, SpO2 92.00%.There is no weight on file to calculate BMI. Results for orders placed during the hospital encounter of 04/09/13 (from the past 72 hour(s))  CBC WITH DIFFERENTIAL     Status: Abnormal   Collection Time    04/09/13  9:45 AM      Result Value Range   WBC 9.7  4.0 - 10.5 K/uL   RBC 5.05  3.87 - 5.11 MIL/uL   Hemoglobin 15.5 (*) 12.0 - 15.0 g/dL   HCT 41.3  24.4 - 01.0 %   MCV 87.1  78.0 - 100.0 fL   MCH 30.7  26.0 - 34.0 pg   MCHC 35.2  30.0 - 36.0 g/dL   RDW 27.2  53.6 - 64.4 %   Platelets 222  150 - 400 K/uL   Neutrophils Relative % 79 (*) 43 - 77 %   Neutro Abs 7.6  1.7 - 7.7 K/uL   Lymphocytes Relative 15  12 - 46 %   Lymphs Abs 1.4  0.7 - 4.0 K/uL   Monocytes Relative 6  3 - 12 %   Monocytes Absolute 0.6  0.1 - 1.0 K/uL   Eosinophils Relative 0  0 - 5 %   Eosinophils Absolute 0.0  0.0 - 0.7 K/uL   Basophils Relative 0  0 - 1 %   Basophils Absolute 0.0  0.0 - 0.1 K/uL  BASIC METABOLIC PANEL     Status: Abnormal   Collection Time    04/09/13  9:45 AM      Result Value Range   Sodium 137  135 - 145 mEq/L   Potassium 3.3 (*) 3.5 - 5.1 mEq/L   Chloride 98  96 - 112 mEq/L   CO2 29  19 - 32 mEq/L   Glucose, Bld 101 (*) 70 - 99 mg/dL   BUN 11  6 - 23 mg/dL   Creatinine, Ser 0.34  0.50 - 1.10 mg/dL   Calcium 9.8  8.4 - 74.2 mg/dL   GFR calc non Af Amer 64 (*) >90 mL/min   GFR calc Af Amer 74 (*) >90 mL/min   Comment:  (NOTE)     The eGFR has been calculated using the CKD EPI equation.     This calculation has not been validated in all clinical situations.     eGFR's persistently <90 mL/min signify possible Chronic Kidney     Disease.  ETHANOL     Status: None   Collection Time    04/09/13  9:45 AM      Result Value Range   Alcohol, Ethyl (B) <11  0 - 11 mg/dL   Comment:            LOWEST DETECTABLE LIMIT FOR     SERUM ALCOHOL IS 11 mg/dL     FOR MEDICAL PURPOSES ONLY  SALICYLATE LEVEL     Status: Abnormal   Collection  Time    04/09/13  9:45 AM      Result Value Range   Salicylate Lvl <2.0 (*) 2.8 - 20.0 mg/dL  ACETAMINOPHEN LEVEL     Status: None   Collection Time    04/09/13  9:45 AM      Result Value Range   Acetaminophen (Tylenol), Serum <15.0  10 - 30 ug/mL   Comment:            THERAPEUTIC CONCENTRATIONS VARY     SIGNIFICANTLY. A RANGE OF 10-30     ug/mL MAY BE AN EFFECTIVE     CONCENTRATION FOR MANY PATIENTS.     HOWEVER, SOME ARE BEST TREATED     AT CONCENTRATIONS OUTSIDE THIS     RANGE.     ACETAMINOPHEN CONCENTRATIONS     >150 ug/mL AT 4 HOURS AFTER     INGESTION AND >50 ug/mL AT 12     HOURS AFTER INGESTION ARE     OFTEN ASSOCIATED WITH TOXIC     REACTIONS.   Labs are reviewed and are pertinent for sligght decrease in K+  No current facility-administered medications for this encounter.   Current Outpatient Prescriptions  Medication Sig Dispense Refill  . atenolol (TENORMIN) 50 MG tablet Take 50 mg by mouth daily.      Marland Kitchen buPROPion (WELLBUTRIN XL) 150 MG 24 hr tablet Take 150 mg by mouth daily.      . hydrochlorothiazide (HYDRODIURIL) 25 MG tablet Take 25 mg by mouth daily.      Marland Kitchen zolpidem (AMBIEN) 10 MG tablet Take 10 mg by mouth at bedtime as needed for sleep.        Psychiatric Specialty Exam:     Blood pressure 133/87, pulse 66, temperature 98.1 F (36.7 C), temperature source Oral, resp. rate 18, SpO2 92.00%.There is no weight on file to calculate BMI.  General  Appearance: Disheveled  Eye Contact::  Poor  Speech:  Slow  Volume:  Decreased  Mood:  Depressed/Tearful  Affect:  Flat  Thought Process:  obsessive thoughts of suicide  Orientation:  Full (Time, Place, and Person)  Thought Content:  Obsessions  Suicidal Thoughts:  Yes.  with intent/plan  Homicidal Thoughts:  No  Memory:  Negative  Judgement:  Impaired  Insight:  Lacking  Psychomotor Activity:  Decreased  Concentration:  Fair  Recall:  Fair  Akathisia:  NA  Handed:  Right  AIMS (if indicated):   na  Assets:  Financial Resources/Insurance  Sleep:   none past 24 hours   Treatment Plan Summary: Dr Elsie Saas recommends and pt agrees to accept inpatient therapy  Court Joy 04/09/2013 3:02 PM  Case discussed with a physician extender and rReviewed the information documented and agree with the treatment plan.  Naika Noto,JANARDHAHA R. 04/16/2013 12:19 PM

## 2013-04-09 NOTE — Tx Team (Signed)
Initial Interdisciplinary Treatment Plan  PATIENT STRENGTHS: (choose at least two) Ability for insight Average or above average intelligence Capable of independent living General fund of knowledge  PATIENT STRESSORS: Marital or family conflict   PROBLEM LIST: Problem List/Patient Goals Date to be addressed Date deferred Reason deferred Estimated date of resolution  Depression 04/09/13     Suicidal Ideation 04/09/13                                                DISCHARGE CRITERIA:  Ability to meet basic life and health needs Improved stabilization in mood, thinking, and/or behavior Verbal commitment to aftercare and medication compliance  PRELIMINARY DISCHARGE PLAN: Attend aftercare/continuing care group Return to previous living arrangement  PATIENT/FAMIILY INVOLVEMENT: This treatment plan has been presented to and reviewed with the patient, Renee Li, and/or family member, .  The patient and family have been given the opportunity to ask questions and make suggestions.  [name], [name] 04/09/2013, 11:12 PM

## 2013-04-10 ENCOUNTER — Encounter (HOSPITAL_COMMUNITY): Payer: Self-pay | Admitting: Psychiatry

## 2013-04-10 DIAGNOSIS — F411 Generalized anxiety disorder: Secondary | ICD-10-CM

## 2013-04-10 DIAGNOSIS — F332 Major depressive disorder, recurrent severe without psychotic features: Principal | ICD-10-CM

## 2013-04-10 MED ORDER — BUPROPION HCL ER (XL) 150 MG PO TB24
150.0000 mg | ORAL_TABLET | Freq: Two times a day (BID) | ORAL | Status: DC
  Administered 2013-04-10 – 2013-04-12 (×4): 150 mg via ORAL
  Filled 2013-04-10 (×8): qty 1

## 2013-04-10 MED ORDER — POTASSIUM CHLORIDE CRYS ER 10 MEQ PO TBCR
10.0000 meq | EXTENDED_RELEASE_TABLET | Freq: Every day | ORAL | Status: DC
  Administered 2013-04-10 – 2013-04-12 (×3): 10 meq via ORAL
  Filled 2013-04-10 (×5): qty 1

## 2013-04-10 MED ORDER — BUPROPION HCL ER (XL) 150 MG PO TB24
150.0000 mg | ORAL_TABLET | Freq: Every day | ORAL | Status: DC
  Administered 2013-04-10: 150 mg via ORAL
  Filled 2013-04-10 (×3): qty 1

## 2013-04-10 MED ORDER — HYDROCHLOROTHIAZIDE 25 MG PO TABS
25.0000 mg | ORAL_TABLET | Freq: Every day | ORAL | Status: DC
  Administered 2013-04-10 – 2013-04-12 (×3): 25 mg via ORAL
  Filled 2013-04-10 (×5): qty 1

## 2013-04-10 MED ORDER — ATENOLOL 50 MG PO TABS
50.0000 mg | ORAL_TABLET | Freq: Every day | ORAL | Status: DC
  Administered 2013-04-10 – 2013-04-12 (×3): 50 mg via ORAL
  Filled 2013-04-10 (×5): qty 1

## 2013-04-10 NOTE — Progress Notes (Signed)
NUTRITION ASSESSMENT  Pt identified as at risk on the Malnutrition Screen Tool  INTERVENTION: 1. Educated patient on the importance of nutrition and encouraged intake of food and beverages. 2. Discussed weight goals. 3. Supplements: none at this time.  NUTRITION DIAGNOSIS: Unintentional weight loss related to sub-optimal intake as evidenced by pt report.   Goal: Pt to meet >/= 90% of their estimated nutrition needs.  Monitor:  PO intake  Assessment:  Patient admitted with depression and SI.  States that her appetite has not been very good but has been trying to eat 3 meals daily.  UBW 167 lbs about 3 months ago.  22 lb loss in the past 3 months.  Currently states that she is eating well.  42 y.o. female  Height: Ht Readings from Last 1 Encounters:  04/09/13 5\' 4"  (1.626 m)    Weight: Wt Readings from Last 1 Encounters:  04/09/13 145 lb (65.772 kg)    Weight Hx: Wt Readings from Last 10 Encounters:  04/09/13 145 lb (65.772 kg)  09/02/12 167 lb 12.8 oz (76.114 kg)    BMI:  Body mass index is 24.88 kg/(m^2). Pt meets criteria for wnl based on current BMI.  Estimated Nutritional Needs: Kcal: 25-30 kcal/kg Protein: > 1 gram protein/kg Fluid: 1 ml/kcal  Diet Order: General Pt is also offered choice of unit snacks mid-morning and mid-afternoon.  Pt is eating as desired.   Lab results and medications reviewed.   Oran Rein, RD, LDN Clinical Inpatient Dietitian Pager:  [phone] Weekend and after hours pager:  [phone]

## 2013-04-10 NOTE — [facility] Group Notes (Signed)
  [facility] LCSW Group Therapy Note  04/10/2013 2:15-3:00  Type of Therapy and Topic:  Group Therapy: Feelings Around D/C & Establishing a Supportive Framework  Participation Level:  Active   Mood:   Appropriate  Description of Group:   What is a supportive framework? What does it look like feel like and how do I discern it from and unhealthy non-supportive network? Learn how to cope when supports are not helpful and don't support you. Discuss what to do when your family/friends are not supportive.The main focus of today's process group was to identify the patient's current support system and decide on other supports that can be put in place. An emphasis was placed on using counselor, doctor, therapy groups, 12-step groups, and problem-specific support groups to expand supports. There was also an extensive discussion about what constitutes a healthy support versus an unhealthy supports.  Therapeutic Goals Addressed in Processing Group: 1. Patient will identify one healthy supportive network that they can use at discharge. 2. Patient will identify one factor of a supportive framework and how to tell it from an unhealthy network. 3. Patient able to identify one coping skill to use when they do not have positive supports from others. 4. Patient will demonstrate ability to communicate their needs through discussion and/or role plays.   Summary of Patient Progress:  Renee Li was observed with appropriate and engaged mood during group session.  She processed her struggle with utilizing her positive supports for fear of judgment.  Pt appears to be gaining insight into how to more effectively utilize positive supports at DC.      Yong Grieser, LCSWA
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
## 2013-04-10 NOTE — BHH Suicide Risk Assessment (Signed)
Suicide Risk Assessment  Admission Assessment     Nursing information obtained from:    Demographic factors:    Current Mental Status:    Loss Factors:    Historical Factors:    Risk Reduction Factors:     CLINICAL FACTORS:   Depression:   Hopelessness Impulsivity Insomnia Unstable or Poor Therapeutic Relationship  COGNITIVE FEATURES THAT CONTRIBUTE TO RISK:  Closed-mindedness Polarized thinking Thought constriction (tunnel vision)    SUICIDE RISK:   Mild:  Suicidal ideation of limited frequency, intensity, duration, and specificity.  There are no identifiable plans, no associated intent, mild dysphoria and related symptoms, good self-control (both objective and subjective assessment), few other risk factors, and identifiable protective factors, including available and accessible social support.  PLAN OF CARE: Please see admission note for more details.    I certify that inpatient services furnished can reasonably be expected to improve the patient's condition.  Kayshaun Polanco T. 04/10/2013, 10:54 AM

## 2013-04-10 NOTE — Progress Notes (Signed)
Adult Psychoeducational Group Note  Date:  04/10/2013 Time:  1:15PM  Group Topic/Focus:  Therapeutic Activity  Participation Level:  Active  Participation Quality:  Appropriate  Affect:  Appropriate  Cognitive:  Appropriate  Insight: Appropriate  Engagement in Group:  Engaged  Modes of Intervention:  Discussion  Additional Comments:  Pt attended group focusing on opening up and communicating with peers. Pt played, "Question Newman Pies," tossing a ball around with peers and answering random questions (ex. "If you had three wishes, what would you wish for?") Pt was active throughout group   Renee Li K 04/10/2013, 1:57 PM

## 2013-04-10 NOTE — Progress Notes (Signed)
Adult Psychoeducational Group Note  Date:  04/10/2013 Time:  9:41 PM  Group Topic/Focus:  Wrap-Up Group:   The focus of this group is to help patients review their daily goal of treatment and discuss progress on daily workbooks.  Participation Level:  Active  Participation Quality:  Appropriate, Attentive and Sharing  Affect:  Appropriate  Cognitive:  Appropriate  Insight: Improving  Engagement in Group:  Engaged  Modes of Intervention:  Education  Additional Comments:  Pt stated she had a great day and she learned a lot from attending groups today.  Pt stated she realized she needs to make time for herself, and do something everyday that is a treat for her.  Pt reported upon discharge she would like to find people to connect with and reach out to others. Pt stated she learned in group to try to touch one person everyday.  Pt reported her husband to be a health support for her.   Stephan Minister [facility] 04/10/2013, 9:41 PM
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
## 2013-04-10 NOTE — Progress Notes (Signed)
Adult Psychoeducational Group Note  Date:  04/10/2013 Time:  1015  Group Topic/Focus:  Healthy Communication:   The focus of this group is to discuss communication, barriers to communication, as well as healthy ways to communicate with others.  Participation Level:  Active  Participation Quality:  Appropriate  Affect:  Appropriate  Cognitive:  Alert and Appropriate  Insight: Appropriate and Good  Engagement in Group:  Supportive  Modes of Intervention:  Education  Additional Comments:  Attentive, sharing  Earline Mayotte 04/10/2013, 2:40 PM

## 2013-04-10 NOTE — Progress Notes (Signed)
Patient ID: Renee Li, female   DOB: Jan 07, 1971, 42 y.o.   MRN: [id number] D. Patient presents with depressed mood, affect anxious. Patient states '' I was really hoping that I'd be discharged, you see I don't think I need to be here anymore, I mean I know I told the doctor that I thought this would be good for me but now that I'm here and I've had some sleep I feel so much better. I think just being around other people and having touch helped '' Patient minimizing reports of depression, rating her depression at 3 /10 on depression scale, however mood presents more depressed. She denies any SI HI A/V Hallucinations at this time. A. Support and encouragement provided .Medications given as ordered R. Patient calm and cooperative, attending unit programming. No further voiced concerns at this time . Will continue to monitor q 15 minutes for safety.

## 2013-04-10 NOTE — Progress Notes (Signed)
Patient ID: Renee Li, female   DOB: 01-Aug-1970, 42 y.o.   MRN: [id number] Pt did not attend processing group on [address] today as MHT reports pt to program on [address]. Carney Bern, LCSW

## 2013-04-11 NOTE — B[facility] Group Notes (Signed)
[facility] LCSW Aftercare Discharge Planning Group Note   04/11/2013 8:45 AM  Participation Quality:  Alert, Appropriate and Oriented  Mood/Affect:  Bright, cheerful  Depression Rating:  1  Anxiety Rating:  0  Thoughts of Suicide:  Pt denies SI/HI  Will you contract for safety?   Yes  Current AVH:  Pt denies  Plan for Discharge/Comments:  Pt attended discharge planning group and actively participated in group.  CSW provided pt with today's workbook.  Patient is currently linked with a counselor, would like referral for a psychiatrist.   Transportation Means: Pt reports access to transportation  Supports: No supports mentioned at this time  Loleta Books, Theresia Majors 04/11/2013 8:34 AM

## 2013-04-11 NOTE — Progress Notes (Signed)
Pt asleep in bed at this time. Unlabored, even breathing. No signs of distress or complaints at this time. q 15 min safety checks. Pt remains safe on unit at this time  

## 2013-04-11 NOTE — Progress Notes (Signed)
Patient ID: Renee Li, female   DOB: 1970-06-12, 42 y.o.   MRN: [id number]  D: Patient presents with depressed and anxious mood. Patient brightens on approach. Pt. Denies SI/HI and A/V Hallucinations. Patient does not report any pain or discomfort at this time. Patient reports that she slept well and her depression and hopelessness is at a 1/10. Patient reports that after discharge she will, "think positive thoughts, stop projecting negative, stay in touch/ connected to positive people/support.   A: Support and encouragement provided to the patient. Scheduled medication given to patient per physician's orders.  R: Patient is receptive and cooperative. Patient is seen in the milieu interacting with staff and other patients. Patient appears nervous but does not complain of any anxiety. Q15 minute checks are maintained for safety.

## 2013-04-11 NOTE — Progress Notes (Signed)
Adult Psychoeducational Group Note  Date:  04/11/2013 Time:  9:54 PM  Group Topic/Focus:  Wrap-Up Group:   The focus of this group is to help patients review their daily goal of treatment and discuss progress on daily workbooks.  Participation Level:  Active  Participation Quality:  Appropriate and Attentive  Affect:  Appropriate  Cognitive:  Alert and Appropriate  Insight: Appropriate  Engagement in Group:  Engaged  Modes of Intervention:  Discussion, Education and Support  Additional Comments:  Pt rated her day at a 10 out of 10. Pt said her goal was to engage more with others, and she did so. Pt said she had great groups and had a long conversation with her roommate. Pt is excited and feels confident about discharging.   Renee Li 04/11/2013, 9:54 PM

## 2013-04-11 NOTE — Tx Team (Signed)
Interdisciplinary Treatment Plan Update (Adult)  Date: 04/11/2013  Time Reviewed:  9:45 AM  Progress in Treatment: Attending groups: Yes Participating in groups:  Yes Taking medication as prescribed:  Yes Tolerating medication:  Yes Family/Significant othe contact made: CSW assessing  Patient understands diagnosis:  Yes Discussing patient identified problems/goals with staff:  Yes Medical problems stabilized or resolved:  Yes Denies suicidal/homicidal ideation: Yes Issues/concerns per patient self-inventory:  Yes Other:  New problem(s) identified: N/A  Discharge Plan or Barriers: CSW assessing for appropriate referrals.  Reason for Continuation of Hospitalization: Anxiety Depression Medication Stabilization  Comments: N/A  Estimated length of stay: 1 day, d/c tomorrow  For review of initial/current patient goals, please see plan of care.  Attendees: Patient:     Family:     Physician:  Dr. Javier Glazier 04/11/2013 10:00 AM   Nursing:   Quintella Reichert, RN 04/11/2013 10:00 AM   Clinical Social Worker:  Reyes Ivan, LCSW 04/11/2013 10:00 AM   Other: Marzetta Board, RN 04/11/2013 10:00 AM   Other:  Elizbeth Squires, care coordination 04/11/2013 10:00 AM   Other:  Juline Patch, LCSW 04/11/2013 10:00 AM   Other:  Neill Loft, RN 04/11/2013 10:01 AM   Other:    Other:    Other:    Other:    Other:    Other:     Scribe for Treatment Team:   Carmina Miller, 04/11/2013 10:00 AM

## 2013-04-11 NOTE — Progress Notes (Signed)
[facility] MD Progress Note  04/11/2013 12:58 PM Renee Li  MRN:  [id number] Subjective:  Patient was admitted for increased symptoms of depression and suicidal ideation and reportedly patient has death in the family. Her depression has been ongoing despite going to counseling at [facility] for a year and with her present provider, Dr. Lady Gary. She is currently taking Wellbutrin 150 mg daily for depression, has been on Lexapro in the past. The patient's 44 yo daughter with her husband, went away for Christmas and she and her ex got into an argument on Christmas Eve. She spent Christmas alone and became overwhelmed and suicidal. Patient stated that she needed to be at home by the time her 44 years old comes home and need to make nice meals which she always does for her. Patient minimizes her symptoms of depression anxiety and contracts for safety at this time.  Diagnosis:   DSM5: Schizophrenia Disorders:   Obsessive-Compulsive Disorders:   Trauma-Stressor Disorders:   Substance/Addictive Disorders:   Depressive Disorders:  Major Depressive Disorder - Severe (296.23)  Axis I: Major Depression, Recurrent severe  ADL's:  Impaired  Sleep: Fair  Appetite:  Fair  Suicidal Ideation:  Patient contracts for safety in the hospital Homicidal Ideation:  Denied AEB (as evidenced by):  Psychiatric Specialty Exam: ROS  Blood pressure 119/83, pulse 81, temperature 97.9 F (36.6 C), temperature source Oral, resp. rate 16, height 5\' 4"  (1.626 m), weight 65.772 kg (145 lb).Body mass index is 24.88 kg/(m^2).  General Appearance: Guarded  Eye Contact::  Minimal  Speech:  Clear and Coherent  Volume:  Decreased  Mood:  Anxious, Depressed and Hopeless  Affect:  Depressed and Flat  Thought Process:  Goal Directed and Intact  Orientation:  Full (Time, Place, and Person)  Thought Content:  Rumination  Suicidal Thoughts:  Yes.  without intent/plan  Homicidal Thoughts:  No  Memory:  Immediate;   Fair   Judgement:  Impaired  Insight:  Lacking  Psychomotor Activity:  Psychomotor Retardation  Concentration:  Fair  Recall:  Fair  Akathisia:  NA  Handed:  Right  AIMS (if indicated):     Assets:  Communication Skills Desire for Improvement Housing Physical Health Resilience Social Support Transportation  Sleep:  Number of Hours: 6.25   Current Medications: Current Facility-Administered Medications  Medication Dose Route Frequency Provider Last Rate Last Dose  . acetaminophen (TYLENOL) tablet 650 mg  650 mg Oral Q6H PRN Court Joy, PA-C   650 mg at 04/10/13 1356  . alum & mag hydroxide-simeth (MAALOX/MYLANTA) 200-200-20 MG/5ML suspension 30 mL  30 mL Oral Q4H PRN Court Joy, PA-C      . atenolol (TENORMIN) tablet 50 mg  50 mg Oral Daily Sanjuana Kava, NP   50 mg at 04/11/13 0834  . buPROPion (WELLBUTRIN XL) 24 hr tablet 150 mg  150 mg Oral BID Nanine Means, NP   150 mg at 04/11/13 0834  . hydrochlorothiazide (HYDRODIURIL) tablet 25 mg  25 mg Oral Daily Sanjuana Kava, NP   25 mg at 04/11/13 0834  . magnesium hydroxide (MILK OF MAGNESIA) suspension 30 mL  30 mL Oral Daily PRN Court Joy, PA-C      . potassium chloride (K-DUR,KLOR-CON) CR tablet 10 mEq  10 mEq Oral Daily Nanine Means, NP   10 mEq at 04/11/13 0836  . traZODone (DESYREL) tablet 50 mg  50 mg Oral QHS PRN,MR X 1 Court Joy, PA-C  Lab Results:  Results for orders placed during the hospital encounter of 04/09/13 (from the past 48 hour(s))  URINE RAPID DRUG SCREEN (HOSP PERFORMED)     Status: None   Collection Time    04/09/13  1:47 PM      Result Value Range   Opiates NONE DETECTED  NONE DETECTED   Cocaine NONE DETECTED  NONE DETECTED   Benzodiazepines NONE DETECTED  NONE DETECTED   Amphetamines NONE DETECTED  NONE DETECTED   Tetrahydrocannabinol NONE DETECTED  NONE DETECTED   Barbiturates NONE DETECTED  NONE DETECTED   Comment:            DRUG SCREEN FOR MEDICAL PURPOSES     ONLY.  IF  CONFIRMATION IS NEEDED     FOR ANY PURPOSE, NOTIFY LAB     WITHIN 5 DAYS.                LOWEST DETECTABLE LIMITS     FOR URINE DRUG SCREEN     Drug Class       Cutoff (ng/mL)     Amphetamine      1000     Barbiturate      200     Benzodiazepine   200     Tricyclics       300     Opiates          300     Cocaine          300     THC              50    Physical Findings: AIMS: Facial and Oral Movements Muscles of Facial Expression: None, normal Lips and Perioral Area: None, normal Jaw: None, normal Tongue: None, normal,Extremity Movements Upper (arms, wrists, hands, fingers): None, normal Lower (legs, knees, ankles, toes): None, normal, Trunk Movements Neck, shoulders, hips: None, normal, Overall Severity Severity of abnormal movements (highest score from questions above): None, normal Incapacitation due to abnormal movements: None, normal Patient's awareness of abnormal movements (rate only patient's report): No Awareness, Dental Status Current problems with teeth and/or dentures?: No Does patient usually wear dentures?: No  CIWA:    COWS:     Treatment Plan Summary: Daily contact with patient to assess and evaluate symptoms and progress in treatment Medication management  Plan: Treatment Plan/Recommendations:   1. Admit for crisis management and stabilization. 2. Medication management to reduce current symptoms to base line and improve the patient's overall level of functioning. 3. Treat health problems as indicated. 4. Develop treatment plan to decrease risk of relapse upon discharge and to reduce the need for readmission. 5. Psycho-social education regarding relapse prevention and self care. 6. Health care follow up as needed for medical problems. 7. Restart home medications where appropriate.   Medical Decision Making Problem Points:  Established problem, worsening (2), New problem, with no additional work-up planned (3), Review of last therapy session (1) and  Review of psycho-social stressors (1) Data Points:  Review or order clinical lab tests (1) Review or order medicine tests (1) Review of medication regiment & side effects (2) Review of new medications or change in dosage (2)  I certify that inpatient services furnished can reasonably be expected to improve the patient's condition.   Detrick Dani,JANARDHAHA R. 04/11/2013, 12:58 PM

## 2013-04-11 NOTE — [facility] Group Notes (Signed)
[facility] LCSW Group Therapy  04/11/2013 2:59 PM  Type of Therapy:  Group Therapy  Participation Level:  Active  Participation Quality:  Appropriate, Attentive and Sharing  Affect:  Appropriate and Depressed  Cognitive:  Alert, Appropriate and Oriented  Insight:  Developing/Improving  Engagement in Therapy:  Developing/Improving  Modes of Intervention:  Activity, Discussion, Exploration, Problem-solving, Socialization and Support  Summary of Progress/Problems:: Pt identified obstacles faced currently and processed barriers involved in overcoming these obstacles. Pt identified steps necessary for overcoming these obstacles and explored motivation (internal and external) for facing these difficulties head on. Pt further identified one area of concern in their lives and chose a goal to focus on for today.    Patient was an active participant in group.  She is able to identify goals that she would like to complete upon discharge, and acknowledged that she can be her own obstacle.  Patient acknowledged that she needs to become more flexible with her family as she often becomes disappointed and angry at her family members if they do not support.  Patient demonstrated insight that she has not asked them for support and that she needs to communicate her needs to them.  Patient was receptive to feedback from peers that encouraged her to stop enabling her child's behaviors that often leads her to feel more depressed and frustration.    Pervis Hocking 04/11/2013, 2:59 PM

## 2013-04-11 NOTE — Progress Notes (Signed)
Adult Psychoeducational Group Note  Date:  04/11/2013 Time:  11:00am Group Topic/Focus:  Wellness Toolbox:   The focus of this group is to discuss various aspects of wellness, balancing those aspects and exploring ways to increase the ability to experience wellness.  Patients will create a wellness toolbox for use upon discharge.  Participation Level:  Active  Participation Quality:  Appropriate and Attentive  Affect:  Appropriate  Cognitive:  Alert and Appropriate  Insight: Appropriate  Engagement in Group:  Engaged  Modes of Intervention:  Discussion and Education  Additional Comments: Pt attended and participated in group. Discussion was on wellness. Pt stated wellness means good nutrition, being in a stable mood, exercise and having balance with work and family.  Shelly Bombard D 04/11/2013, 4:04 PM

## 2013-04-11 NOTE — Progress Notes (Signed)
Patient ID: Renee Li, female   DOB: 09/19/1970, 42 y.o.   MRN: [id number] D: Pt. Reports depression at "1" of 10. Pt. Reports being here helpful "I got to rest, soothers, learned coping skills, playing cards, been engaged in support group" A: Writer introduced self, provided emotional support, reviewed medications and encouraged client to follow up with discharge plans to attend support groups. Staff will monitor q48min for safety. R: Pt. Is safe on the unit. Pt. Attended group.

## 2013-04-12 DIAGNOSIS — R45851 Suicidal ideations: Secondary | ICD-10-CM

## 2013-04-12 MED ORDER — HYDROCHLOROTHIAZIDE 25 MG PO TABS: 25.0000 mg | ORAL_TABLET | Freq: Every day | ORAL | Status: DC

## 2013-04-12 MED ORDER — TRAZODONE HCL 50 MG PO TABS: 50.0000 mg | ORAL_TABLET | Freq: Every evening | ORAL | Status: DC | PRN

## 2013-04-12 MED ORDER — BUPROPION HCL ER (XL) 150 MG PO TB24: 150.0000 mg | ORAL_TABLET | Freq: Two times a day (BID) | ORAL | Status: DC

## 2013-04-12 MED ORDER — ATENOLOL 50 MG PO TABS: 50.0000 mg | ORAL_TABLET | Freq: Every day | ORAL | Status: DC

## 2013-04-12 MED ORDER — POTASSIUM CHLORIDE CRYS ER 10 MEQ PO TBCR: 10.0000 meq | EXTENDED_RELEASE_TABLET | Freq: Every day | ORAL | Status: DC

## 2013-04-12 NOTE — [facility] Suicide Risk Assessment (Signed)
[facility] INPATIENT:  Family/Significant Other Suicide Prevention Education  Suicide Prevention Education:  Education Completed; Conchetta Lamia (husband) has been identified by the patient as the family member/significant other with whom the patient will be residing, and identified as the person(s) who will aid the patient in the event of a mental health crisis (suicidal ideations/suicide attempt).  With written consent from the patient, the family member/significant other has been provided the following suicide prevention education, prior to the and/or following the discharge of the patient.  The suicide prevention education provided includes the following:  Suicide risk factors  Suicide prevention and interventions  National Suicide Hotline telephone number  [facility] assessment telephone number  [facility] Emergency Assistance 911  [facility] and/or Residential Mobile Crisis Unit telephone number  Request made of family/significant other to:  Remove weapons (e.g., guns, rifles, knives), all items previously/currently identified as safety concern.    Remove drugs/medications (over-the-counter, prescriptions, illicit drugs), all items previously/currently identified as a safety concern.  The family member/significant other verbalizes understanding of the suicide prevention education information provided.  The family member/significant other agrees to remove the items of safety concern listed above.  Pervis Hocking 04/12/2013, 10:08 AM

## 2013-04-12 NOTE — Progress Notes (Cosign Needed)
Pt d/c to home as she is transported to [facility] via security to pick up her car. D/c instructions, rx's, and suicide prevention information given and reviewed. Pt verbalizes understanding and denies s.i.

## 2013-04-12 NOTE — Discharge Summary (Signed)
Physician Discharge Summary Note  Patient:  Renee Li is an 42 y.o., female MRN:  [id number] DOB:  06/25/1970 Patient phone:  [phone] (home)  Patient address:   [address] [place] [address],   Date of Admission:  04/09/2013 Date of Discharge: 04/12/2013  Reason for Admission:  Depression with suicide plan to overdose  Discharge Diagnoses: Active Problems:   Depression with suicidal ideation  Review of Systems  Constitutional: Negative.   HENT: Negative.   Eyes: Negative.   Respiratory: Negative.   Cardiovascular: Negative.   Gastrointestinal: Negative.   Genitourinary: Negative.   Musculoskeletal: Negative.   Skin: Negative.   Neurological: Negative.   Endo/Heme/Allergies: Negative.   Psychiatric/Behavioral: The patient is nervous/anxious.     DSM5:  Depressive Disorders:  Major Depressive Disorder - Severe (296.23)  Axis Diagnosis:   AXIS I:  Major Depression, Recurrent severe AXIS II:  Deferred AXIS III:   Past Medical History  Diagnosis Date  . Hypertension   . Heart murmur   . Mental disorder   . Depression    AXIS IV:  other psychosocial or environmental problems, problems related to social environment and problems with primary support group AXIS V:  61-70 mild symptoms  Level of Care:  OP  Hospital Course:  ON admission: Patient's depression began a few years ago when she was caring for her mother who had stage 4 lung cancer, died 2 years ago. Her sisters gave her a hard time because she had placed her mother in Hospice the last few weeks to die peacefully. Her depression has been ongoing despite going to counseling at [facility] for a year and with her present provider, Dr. Lady Gary. She is currently taking Wellbutrin 150 mg daily for depression, has been on Lexapro in the past. Her husband is frustrated that she is not the person he married and upset that she is still depressed, he left 3 months ago but they continued to work on  their issues. The patient's 6 yo daughter by her husband went away for Christmas and she and her ex got into an argument on Christmas Eve. A previous note states he told her the marriage was over but she does not endorse this. She spent Christmas alone and became overwhelmed and suicidal. No alcohol or drug use. Decreased appetite with a 15 pound weight loss in the past three months with some memory loss and racing thoughts related to anxiety.  During hospitalization:   Medications managed--Wellbutrin 150 mg daily for depression increased to BID and Trazodone 50 mg at bedtime for sleep issues started.  Her blood pressure medications were continued with the addition of potassium 10 mEq daily for supplementation.  Renee Li states when she is upset or depressed in the future, she will use her soothers to calm herself--singing, positive thoughts, call a support person.  She feels the groups have really worked for her---she attended and participated in therapy.  She denied suicidal/homicidal ideations and auditory/visual hallucinations, follow-up appointments encouraged to attend, Rx given.  Renee Li is mentally and physically stable for discharge.  Consults:  None  Significant Diagnostic Studies:  labs: completed, reviewed, stable  Discharge Vitals:   Blood pressure 119/83, pulse 81, temperature 97.9 F (36.6 C), temperature source Oral, resp. rate 16, height 5\' 4"  (1.626 m), weight 65.772 kg (145 lb). Body mass index is 24.88 kg/(m^2). Lab Results:   Results for orders placed during the hospital encounter of 04/09/13 (from the past 72 hour(s))  CBC WITH DIFFERENTIAL  Status: Abnormal   Collection Time    04/09/13  9:45 AM      Result Value Range   WBC 9.7  4.0 - 10.5 K/uL   RBC 5.05  3.87 - 5.11 MIL/uL   Hemoglobin 15.5 (*) 12.0 - 15.0 g/dL   HCT 86.5  78.4 - 69.6 %   MCV 87.1  78.0 - 100.0 fL   MCH 30.7  26.0 - 34.0 pg   MCHC 35.2  30.0 - 36.0 g/dL   RDW 29.5  28.4 - 13.2 %   Platelets 222  150 -  400 K/uL   Neutrophils Relative % 79 (*) 43 - 77 %   Neutro Abs 7.6  1.7 - 7.7 K/uL   Lymphocytes Relative 15  12 - 46 %   Lymphs Abs 1.4  0.7 - 4.0 K/uL   Monocytes Relative 6  3 - 12 %   Monocytes Absolute 0.6  0.1 - 1.0 K/uL   Eosinophils Relative 0  0 - 5 %   Eosinophils Absolute 0.0  0.0 - 0.7 K/uL   Basophils Relative 0  0 - 1 %   Basophils Absolute 0.0  0.0 - 0.1 K/uL  BASIC METABOLIC PANEL     Status: Abnormal   Collection Time    04/09/13  9:45 AM      Result Value Range   Sodium 137  135 - 145 mEq/L   Potassium 3.3 (*) 3.5 - 5.1 mEq/L   Chloride 98  96 - 112 mEq/L   CO2 29  19 - 32 mEq/L   Glucose, Bld 101 (*) 70 - 99 mg/dL   BUN 11  6 - 23 mg/dL   Creatinine, Ser 4.40  0.50 - 1.10 mg/dL   Calcium 9.8  8.4 - 10.2 mg/dL   GFR calc non Af Amer 64 (*) >90 mL/min   GFR calc Af Amer 74 (*) >90 mL/min   Comment: (NOTE)     The eGFR has been calculated using the CKD EPI equation.     This calculation has not been validated in all clinical situations.     eGFR's persistently <90 mL/min signify possible Chronic Kidney     Disease.  ETHANOL     Status: None   Collection Time    04/09/13  9:45 AM      Result Value Range   Alcohol, Ethyl (B) <11  0 - 11 mg/dL   Comment:            LOWEST DETECTABLE LIMIT FOR     SERUM ALCOHOL IS 11 mg/dL     FOR MEDICAL PURPOSES ONLY  SALICYLATE LEVEL     Status: Abnormal   Collection Time    04/09/13  9:45 AM      Result Value Range   Salicylate Lvl <2.0 (*) 2.8 - 20.0 mg/dL  ACETAMINOPHEN LEVEL     Status: None   Collection Time    04/09/13  9:45 AM      Result Value Range   Acetaminophen (Tylenol), Serum <15.0  10 - 30 ug/mL   Comment:            THERAPEUTIC CONCENTRATIONS VARY     SIGNIFICANTLY. A RANGE OF 10-30     ug/mL MAY BE AN EFFECTIVE     CONCENTRATION FOR MANY PATIENTS.     HOWEVER, SOME ARE BEST TREATED     AT CONCENTRATIONS OUTSIDE THIS     RANGE.     ACETAMINOPHEN CONCENTRATIONS     >  150 ug/mL AT 4 HOURS AFTER      INGESTION AND >50 ug/mL AT 12     HOURS AFTER INGESTION ARE     OFTEN ASSOCIATED WITH TOXIC     REACTIONS.  URINE RAPID DRUG SCREEN (HOSP PERFORMED)     Status: None   Collection Time    04/09/13  1:47 PM      Result Value Range   Opiates NONE DETECTED  NONE DETECTED   Cocaine NONE DETECTED  NONE DETECTED   Benzodiazepines NONE DETECTED  NONE DETECTED   Amphetamines NONE DETECTED  NONE DETECTED   Tetrahydrocannabinol NONE DETECTED  NONE DETECTED   Barbiturates NONE DETECTED  NONE DETECTED   Comment:            DRUG SCREEN FOR MEDICAL PURPOSES     ONLY.  IF CONFIRMATION IS NEEDED     FOR ANY PURPOSE, NOTIFY LAB     WITHIN 5 DAYS.                LOWEST DETECTABLE LIMITS     FOR URINE DRUG SCREEN     Drug Class       Cutoff (ng/mL)     Amphetamine      1000     Barbiturate      200     Benzodiazepine   200     Tricyclics       300     Opiates          300     Cocaine          300     THC              50    Physical Findings: AIMS: Facial and Oral Movements Muscles of Facial Expression: None, normal Lips and Perioral Area: None, normal Jaw: None, normal Tongue: None, normal,Extremity Movements Upper (arms, wrists, hands, fingers): None, normal Lower (legs, knees, ankles, toes): None, normal, Trunk Movements Neck, shoulders, hips: None, normal, Overall Severity Severity of abnormal movements (highest score from questions above): None, normal Incapacitation due to abnormal movements: None, normal Patient's awareness of abnormal movements (rate only patient's report): No Awareness, Dental Status Current problems with teeth and/or dentures?: No Does patient usually wear dentures?: No  CIWA:    COWS:     Psychiatric Specialty Exam: See Psychiatric Specialty Exam and Suicide Risk Assessment completed by Attending Physician prior to discharge.  Discharge destination:  Home  Is patient on multiple antipsychotic therapies at discharge:  No   Has Patient had three or more  failed trials of antipsychotic monotherapy by history:  No  Recommended Plan for Multiple Antipsychotic Therapies: NA  Discharge Orders   Future Orders Complete By Expires   Activity as tolerated - No restrictions  As directed    Diet - low sodium heart healthy  As directed        Medication List       Indication   atenolol 50 MG tablet  Commonly known as:  TENORMIN  Take 1 tablet (50 mg total) by mouth daily.   Indication:  High Blood Pressure     buPROPion 150 MG 24 hr tablet  Commonly known as:  WELLBUTRIN XL  Take 1 tablet (150 mg total) by mouth 2 (two) times daily.   Indication:  Major Depressive Disorder     hydrochlorothiazide 25 MG tablet  Commonly known as:  HYDRODIURIL  Take 1 tablet (25 mg total) by mouth daily.  Indication:  High Blood Pressure     potassium chloride 10 MEQ tablet  Commonly known as:  K-DUR,KLOR-CON  Take 1 tablet (10 mEq total) by mouth daily.   Indication:  High Blood Pressure, Low Amount of Potassium in the Blood     traZODone 50 MG tablet  Commonly known as:  DESYREL  Take 1 tablet (50 mg total) by mouth at bedtime as needed and may repeat dose one time if needed for sleep.            Follow-up Information   Follow up with [facility]. (Follow-up with therapist, Calvert Cantor, on 1/2 at 9:00am. )    Contact information:   [address] [place], [place] [address] [phone]      Follow up with [facility]. (For medication management. An appointment with Dr. Lolly Mustache has been scheduled for 1/16 at 10:00am. Please bring completed new patient packet. )    Specialty:  Behavioral Health   Contact information:   [address] [place] [address] [phone]      Follow-up recommendations:  Activity:  as tolerated Diet:  low-sodium heart healthy diet Continue to work your relapse prevention plan Comments:  Take all your medications as prescribed by your mental  healthcare provider. Report any adverse effects and or reactions from your medicines to your outpatient provider promptly. Patient is instructed and cautioned to not engage in alcohol and or illegal drug use while on prescription medicines. In the event of worsening symptoms, patient is instructed to call the crisis hotline, 911 and or go to the nearest ED for appropriate evaluation and treatment of symptoms. Follow-up with your primary care provider for your other medical issues, concerns and or health care needs.  Total Discharge Time:  Greater than 30 minutes.  SignedNanine Means, PMH-NP 04/12/2013, 8:47 AM Agree with assessment and plan Reymundo Poll. Dub Mikes, M.D.

## 2013-04-12 NOTE — Progress Notes (Signed)
The focus of this group is to educate the patient on the purpose and policies of crisis stabilization and provide a format to answer questions about their admission.  The group details unit policies and expectations of patients while admitted.  Patient attended 0900 nurse education orientation group this morning.  Patient listened, appropriate affect, alert, appropriate insight and engagement.  Today patient will work on 3 goals for discharge.  

## 2013-04-12 NOTE — Progress Notes (Signed)
[facility] Adult Case Management Discharge Plan :  Will you be returning to the same living situation after discharge: Yes,  with husband At discharge, do you have transportation home?:Yes,  self.  Do you have the ability to pay for your medications:Yes,  no barriers  Release of information consent forms completed and in the chart;  Patient's signature needed at discharge.  Patient to Follow up at: Follow-up Information   Follow up with [facility]. (Follow-up with therapist, Calvert Cantor, on 1/2 at 9:00am. )    Contact information:   [address] [place], [place] [address] [phone]      Follow up with [facility]. (For medication management. An appointment with Dr. Lolly Mustache has been scheduled for 1/16 at 10:00am. Please bring completed new patient packet. )    Specialty:  Behavioral Health   Contact information:   [address] [place] [address] [phone]      Patient denies SI/HI:   Yes,  denies    Safety Planning and Suicide Prevention discussed:  Yes,  education and resources provided to husband  Pervis Hocking 04/12/2013, 10:56 AM

## 2013-04-12 NOTE — Progress Notes (Cosign Needed)
D) Pt presents as appropriate in mood and affect. Pt is cooperative on approach. Rosie has been positive for all groups and activities with minimal prompting. Pt is working on learning how to isolate less and stay connected to one person each day when she goes home. Pt is also trying to keep her thoughts positive. Pt denies s.i., no c/o pain. A) level 3 obs for safety, support and encouragement provided. Contract for safety. R) Pt is receptive.

## 2013-04-12 NOTE — [facility] Suicide Risk Assessment (Signed)
Suicide Risk Assessment  Discharge Assessment     Demographic Factors:  NA  Mental Status Per Nursing Assessment::   On Admission:     Current Mental Status by Physician: In full contact with reality. Denies suicidal ideas, plans or intent. States Christmas has been very stressful. mother died 2 years ago. States she authorized her mother being disconnected. states the family continues to give her a hard time. She is taking medications but still angry at herself, her mother for smocking, the family for giving her a hard time about her decision. . Because of her "mood swings," her husband separated. Got very overwhelmed started thinking about hurting herself "crazy thoughts." States she was thinking about OD ing. She called the crisis line. She worked a Water engineer. Her MD office was closed for the Holidays. She came to [facility]. She states she has learned a lot, better coping skills, has a brigther outlook on life.    Loss Factors: Loss of significant relationship  Historical Factors: NA  Risk Reduction Factors:   Sense of responsibility to family, Religious beliefs about death, Employed, Living with another person, especially a relative and Positive social support  Continued Clinical Symptoms:  Depression:   Impulsivity  Cognitive Features That Contribute To Risk:  Polarized thinking Thought constriction (tunnel vision)    Suicide Risk:  Minimal: No identifiable suicidal ideation.  Patients presenting with no risk factors but with morbid ruminations; may be classified as minimal risk based on the severity of the depressive symptoms  Discharge Diagnoses:   AXIS I:  Major Depression AXIS II:  Deferred AXIS III:   Past Medical History  Diagnosis Date  . Hypertension   . Heart murmur   . Mental disorder   . Depression    AXIS IV:  other psychosocial or environmental problems AXIS V:  61-70 mild symptoms  Plan Of Care/Follow-up recommendations:  Activity:  as tolerated Diet:   regular Follow up outpatient basis Is patient on multiple antipsychotic therapies at discharge:  No   Has Patient had three or more failed trials of antipsychotic monotherapy by history:  No  Recommended Plan for Multiple Antipsychotic Therapies: NA  Kamaiyah Uselton A 04/12/2013, 10:57 AM

## 2013-04-13 NOTE — Progress Notes (Signed)
Patient Discharge Instructions:  After Visit Summary (AVS):   Faxed to:  04/13/13 Discharge Summary Note:   Faxed to:  04/13/13 Psychiatric Admission Assessment Note:   Faxed to:  04/13/13 Suicide Risk Assessment - Discharge Assessment:   Faxed to:  04/13/13 Faxed/Sent to the Next Level Care provider:  04/13/13 Next Level Care Provider Has Access to the EMR, 04/13/13 Faxed to [facility] @ [phone] Records provided to [facility] via [facility]/Epic access.  Jerelene Redden, 04/13/2013, 4:02 PM

## 2013-04-29 ENCOUNTER — Ambulatory Visit (INDEPENDENT_AMBULATORY_CARE_PROVIDER_SITE_OTHER): Payer: 59 | Admitting: Psychiatry

## 2013-04-29 ENCOUNTER — Encounter (HOSPITAL_COMMUNITY): Payer: Self-pay | Admitting: Psychiatry

## 2013-04-29 VITALS — Wt 156.0 lb

## 2013-04-29 DIAGNOSIS — F339 Major depressive disorder, recurrent, unspecified: Secondary | ICD-10-CM

## 2013-04-29 DIAGNOSIS — F329 Major depressive disorder, single episode, unspecified: Secondary | ICD-10-CM

## 2013-04-29 MED ORDER — BUPROPION HCL ER (XL) 300 MG PO TB24: 300.0000 mg | ORAL_TABLET | Freq: Every day | ORAL | Status: DC

## 2013-04-29 MED ORDER — TRAZODONE HCL 50 MG PO TABS
50.0000 mg | ORAL_TABLET | Freq: Every evening | ORAL | Status: DC | PRN
Start: 2013-04-29 — End: 2018-11-16

## 2013-04-29 NOTE — Progress Notes (Signed)
[facility] Behavioral Health Initial Assessment Note  Renee Li [id number] 43 y.o.  04/29/2013 11:21 AM  Chief Complaint:  I am feeling better.  I want to continue my medication.  History of Present Illness:  The patient is a 43 year old currently separated, employed [place] American female who was referred from inpatient psychiatric services for the management of depression and continuity of care.  The patient was admitted to behavioral [facility] from December 27 of December 30 because of suicidal thoughts and plan to overdose on her medication.  Patient was experiencing increased depression and anxiety symptoms.  She has excessive guilt about her mother who deceased in 06/05/10.  She had a stage IV lung cancer and her doctor was forcing her to get food the patient was not given fruit as her mother does not want to eat.  The patient endorsed she suspected her mother vicious however her sister's are accusing and blaming her for her mother's death.  Patient was also feeling very sad because her 60 year old daughter was not available on the Christmas and her husband decided to get separation same time.  She was feeling very lonely, having poor sleep, racing thoughts, crying spells and feeling of hopelessness and helplessness.  She decided to get some help because she does not want to kill herself.  During hospitalization her Wellbutrin was increased.  She is taking Wellbutrin 150 mg twice a day.  She was also given trazodone 50 mg at bedtime.  She is seeing Peterson Lombard for counseling.  Patient is doing much better with the adjustment of medication.  She still has crying spells and racing thoughts but denies any suicidal thoughts or homicidal thoughts.  She still feels a lot of regret and guilt about her mother's death however she does not talk to her sisters anymore.  She is relieved that her husband is willing to do marriage counseling and her daughter is living with her.  Sometimes she has  issues with her 17 year old daughter who does not listen to her the patient is able to handle her.  She sleeping on and off.  She admitted chronic feeling of depression, guilt and regret but denies any hallucinations, suicidal thoughts or homicidal thoughts.  She denies any paranoia or any poor impulse control.  She denies any side effects of medication.  She is not drinking or using any illegal substances.  Patient denies any history of mania, psychosis, OCD, flashback or any nightmares.  Suicidal Ideation: No Plan Formed: No Patient has means to carry out plan: No  Homicidal Ideation: No Plan Formed: No Patient has means to carry out plan: No  Past Psychiatric History/Hospitalization(s) Patient endorses history of depression in 06/06/03 and she was laid off from her work.  She was given Lexapro by her primary care physician and she did felt better and stopped the medication after some time.  She stopped taking Wellbutrin in 2010/06/05 after the death of her mother.  She denies any history of suicidal attempt but endorses T. of passive suicidal thinking.  She has no history of mania, psychosis, poor impulse control, physical or sexual abuse or any OCD symptoms. Anxiety: Yes Bipolar Disorder: No Depression: Yes Mania: No Psychosis: No Schizophrenia: No Personality Disorder: No Hospitalization for psychiatric illness: Yes History of Electroconvulsive Shock Therapy: No Prior Suicide Attempts: No  Medical History; Patient has hypertension, heart murmur but denies any seizures or any loss of consciousness.  Traumatic brain injury: Denies any history of traumatic brain injury.  Family History; The  patient denies any history of family psychiatric illness.  Education and Work History; Patient is graduate and working from home.  She is working as a Tax adviser.  Psychosocial History; Patient is a 38 year old daughter.  She married twice.  Currently she separated from her  husband.  Patient has 2 older sister however she has limited contact with them.  Legal History; Patient denies any history of legal issues.  History Of Abuse; Patient denies any history of abuse.  Substance Abuse History; Patient denies any history of illegal substance use and drinking.   Review of Systems: Psychiatric: Agitation: No Hallucination: No Depressed Mood: Yes Insomnia: Yes Hypersomnia: No Altered Concentration: No Feels Worthless: No Grandiose Ideas: No Belief In Special Powers: No New/Increased Substance Abuse: No Compulsions: No  Neurologic: Headache: No Seizure: No Paresthesias: No    Outpatient Encounter Prescriptions as of 04/29/2013  Medication Sig  . atenolol (TENORMIN) 50 MG tablet Take 1 tablet (50 mg total) by mouth daily.  Marland Kitchen buPROPion (WELLBUTRIN XL) 300 MG 24 hr tablet Take 1 tablet (300 mg total) by mouth daily.  . hydrochlorothiazide (HYDRODIURIL) 25 MG tablet Take 1 tablet (25 mg total) by mouth daily.  . potassium chloride (K-DUR,KLOR-CON) 10 MEQ tablet Take 1 tablet (10 mEq total) by mouth daily.  . traZODone (DESYREL) 50 MG tablet Take 1 tablet (50 mg total) by mouth at bedtime as needed and may repeat dose one time if needed for sleep.  . [DISCONTINUED] buPROPion (WELLBUTRIN XL) 150 MG 24 hr tablet Take 1 tablet (150 mg total) by mouth 2 (two) times daily.  . [DISCONTINUED] traZODone (DESYREL) 50 MG tablet Take 1 tablet (50 mg total) by mouth at bedtime as needed and may repeat dose one time if needed for sleep.    Recent Results (from the past 2160 hour(s))  CBC WITH DIFFERENTIAL     Status: Abnormal   Collection Time    04/09/13  9:45 AM      Result Value Range   WBC 9.7  4.0 - 10.5 K/uL   RBC 5.05  3.87 - 5.11 MIL/uL   Hemoglobin 15.5 (*) 12.0 - 15.0 g/dL   HCT 44.0  36.0 - 46.0 %   MCV 87.1  78.0 - 100.0 fL   MCH 30.7  26.0 - 34.0 pg   MCHC 35.2  30.0 - 36.0 g/dL   RDW 12.7  11.5 - 15.5 %   Platelets 222  150 - 400 K/uL    Neutrophils Relative % 79 (*) 43 - 77 %   Neutro Abs 7.6  1.7 - 7.7 K/uL   Lymphocytes Relative 15  12 - 46 %   Lymphs Abs 1.4  0.7 - 4.0 K/uL   Monocytes Relative 6  3 - 12 %   Monocytes Absolute 0.6  0.1 - 1.0 K/uL   Eosinophils Relative 0  0 - 5 %   Eosinophils Absolute 0.0  0.0 - 0.7 K/uL   Basophils Relative 0  0 - 1 %   Basophils Absolute 0.0  0.0 - 0.1 K/uL  BASIC METABOLIC PANEL     Status: Abnormal   Collection Time    04/09/13  9:45 AM      Result Value Range   Sodium 137  135 - 145 mEq/L   Potassium 3.3 (*) 3.5 - 5.1 mEq/L   Chloride 98  96 - 112 mEq/L   CO2 29  19 - 32 mEq/L   Glucose, Bld 101 (*) 70 -  99 mg/dL   BUN 11  6 - 23 mg/dL   Creatinine, Ser 1.05  0.50 - 1.10 mg/dL   Calcium 9.8  8.4 - 10.5 mg/dL   GFR calc non Af Amer 64 (*) >90 mL/min   GFR calc Af Amer 74 (*) >90 mL/min   Comment: (NOTE)     The eGFR has been calculated using the CKD EPI equation.     This calculation has not been validated in all clinical situations.     eGFR's persistently <90 mL/min signify possible Chronic Kidney     Disease.  ETHANOL     Status: None   Collection Time    04/09/13  9:45 AM      Result Value Range   Alcohol, Ethyl (B) <11  0 - 11 mg/dL   Comment:            LOWEST DETECTABLE LIMIT FOR     SERUM ALCOHOL IS 11 mg/dL     FOR MEDICAL PURPOSES ONLY  SALICYLATE LEVEL     Status: Abnormal   Collection Time    04/09/13  9:45 AM      Result Value Range   Salicylate Lvl <7.1 (*) 2.8 - 20.0 mg/dL  ACETAMINOPHEN LEVEL     Status: None   Collection Time    04/09/13  9:45 AM      Result Value Range   Acetaminophen (Tylenol), Serum <15.0  10 - 30 ug/mL   Comment:            THERAPEUTIC CONCENTRATIONS VARY     SIGNIFICANTLY. A RANGE OF 10-30     ug/mL MAY BE AN EFFECTIVE     CONCENTRATION FOR MANY PATIENTS.     HOWEVER, SOME ARE BEST TREATED     AT CONCENTRATIONS OUTSIDE THIS     RANGE.     ACETAMINOPHEN CONCENTRATIONS     >150 ug/mL AT 4 HOURS AFTER     INGESTION  AND >50 ug/mL AT 12     HOURS AFTER INGESTION ARE     OFTEN ASSOCIATED WITH TOXIC     REACTIONS.  URINE RAPID DRUG SCREEN (HOSP PERFORMED)     Status: None   Collection Time    04/09/13  1:47 PM      Result Value Range   Opiates NONE DETECTED  NONE DETECTED   Cocaine NONE DETECTED  NONE DETECTED   Benzodiazepines NONE DETECTED  NONE DETECTED   Amphetamines NONE DETECTED  NONE DETECTED   Tetrahydrocannabinol NONE DETECTED  NONE DETECTED   Barbiturates NONE DETECTED  NONE DETECTED   Comment:            DRUG SCREEN FOR MEDICAL PURPOSES     ONLY.  IF CONFIRMATION IS NEEDED     FOR ANY PURPOSE, NOTIFY LAB     WITHIN 5 DAYS.                LOWEST DETECTABLE LIMITS     FOR URINE DRUG SCREEN     Drug Class       Cutoff (ng/mL)     Amphetamine      1000     Barbiturate      200     Benzodiazepine   245     Tricyclics       809     Opiates          300     Cocaine          300  THC              50      Physical Exam: Constitutional:  Wt 156 lb (70.761 kg)  Musculoskeletal: Strength & Muscle Tone: within normal limits Gait & Station: normal Patient leans: Patient has normal posture  Mental Status Examination;  Patient is casually dressed and fairly groomed.  She appears anxious but cooperative.  Her speech is slow but clear and coherent.  She describes her mood is sad and depressed and her affect is constricted.  She denies any auditory or visual hallucination.  She denies any active or passive suicidal thoughts or homicidal thoughts.  There were no flight of ideas or any loose association.  Attention and concentration is okay.  Her fund of knowledge is adequate.  There were no paranoia, delusions or any obsessions present at this time.  She is alert and oriented x3.  There were no tremors or shakes.  Her insight judgment and impulse control is okay.   Medical Decision Making (Choose Three): Established Problem, Stable/Improving (1), Review of Psycho-Social Stressors (1),  Review or order clinical lab tests (1), Decision to obtain old records (1), Review and summation of old records (2), Review of Medication Regimen & Side Effects (2) and Review of New Medication or Change in Dosage (2)  Assessment: Axis I: Maj. depressive disorder, recurrent  Axis II: Deferred  Axis III:  Past Medical History  Diagnosis Date  . Hypertension   . Heart murmur   . Mental disorder   . Depression     Axis IV: Mild to moderate   Plan:  I reviewed discharge summary, blood results, collateral information in her current medication.  Patient is doing somewhat better with the Wellbutrin and trazodone at bedtime.  I recommend to take Wellbutrin 300 mg in the morning.  She was taking 150 twice a day.  I recommend taking in the morning may help her insomnia.  Recommend to use trazodone for sleep.  Patient is scheduled to see her therapist Peterson Lombard on January 22.  I also offered intensive outpatient program since patient continues to have a lot of psychosocial issues and she continues to have grief about her mother's death.  Patient will think about it and let us know.  I recommend to call us back if she has any question or any concern.  Followup in 3 weeks.Time spent 55 minutes.  More than 50% of the time spent in psychoeducation, counseling and coordination of care.  Discuss safety plan that anytime having active suicidal thoughts or homicidal thoughts then patient need to call 911 or go to the local emergency room.    Veer Elamin T., MD 04/29/2013

## 2013-05-30 ENCOUNTER — Ambulatory Visit (HOSPITAL_COMMUNITY): Payer: Self-pay | Admitting: Psychiatry

## 2013-06-06 ENCOUNTER — Other Ambulatory Visit (HOSPITAL_COMMUNITY): Payer: Self-pay | Admitting: Psychiatry

## 2013-06-06 NOTE — Telephone Encounter (Signed)
Patient needs to be seen.

## 2013-09-23 ENCOUNTER — Ambulatory Visit: Payer: Self-pay | Admitting: Internal Medicine

## 2014-11-24 ENCOUNTER — Ambulatory Visit: Payer: Self-pay | Admitting: Internal Medicine

## 2014-12-28 ENCOUNTER — Emergency Department (HOSPITAL_COMMUNITY): Payer: PRIVATE HEALTH INSURANCE

## 2014-12-28 ENCOUNTER — Encounter (HOSPITAL_COMMUNITY): Payer: Self-pay | Admitting: Emergency Medicine

## 2014-12-28 ENCOUNTER — Emergency Department (HOSPITAL_COMMUNITY)
Admission: EM | Admit: 2014-12-28 | Discharge: 2014-12-28 | Disposition: A | Discharge status: Home / Self Care | Payer: PRIVATE HEALTH INSURANCE | Attending: Emergency Medicine | Admitting: Emergency Medicine

## 2014-12-28 DIAGNOSIS — Z79899 Other long term (current) drug therapy: Secondary | ICD-10-CM | POA: Insufficient documentation

## 2014-12-28 DIAGNOSIS — M5412 Radiculopathy, cervical region: Secondary | ICD-10-CM

## 2014-12-28 DIAGNOSIS — R0789 Other chest pain: Secondary | ICD-10-CM

## 2014-12-28 DIAGNOSIS — R079 Chest pain, unspecified: Secondary | ICD-10-CM | POA: Diagnosis present

## 2014-12-28 DIAGNOSIS — M545 Low back pain: Secondary | ICD-10-CM | POA: Insufficient documentation

## 2014-12-28 DIAGNOSIS — I10 Essential (primary) hypertension: Secondary | ICD-10-CM | POA: Insufficient documentation

## 2014-12-28 DIAGNOSIS — F329 Major depressive disorder, single episode, unspecified: Secondary | ICD-10-CM | POA: Diagnosis not present

## 2014-12-28 DIAGNOSIS — R1013 Epigastric pain: Secondary | ICD-10-CM | POA: Diagnosis not present

## 2014-12-28 DIAGNOSIS — R011 Cardiac murmur, unspecified: Secondary | ICD-10-CM | POA: Insufficient documentation

## 2014-12-28 LAB — HEPATIC FUNCTION PANEL
ALBUMIN: 4.6 g/dL (ref 3.5–5.0)
ALT: 9 U/L — AB (ref 14–54)
AST: 16 U/L (ref 15–41)
Alkaline Phosphatase: 50 U/L (ref 38–126)
BILIRUBIN TOTAL: 0.4 mg/dL (ref 0.3–1.2)
Bilirubin, Direct: <0.1 mg/dL — ABNORMAL LOW (ref 0.1–0.5)
Total Protein: 8 g/dL (ref 6.5–8.1)

## 2014-12-28 LAB — BASIC METABOLIC PANEL
Anion gap: 7 (ref 5–15)
BUN: 16 mg/dL (ref 6–20)
CHLORIDE: 99 mmol/L — AB (ref 101–111)
CO2: 31 mmol/L (ref 22–32)
CREATININE: 1.02 mg/dL — AB (ref 0.44–1.00)
Calcium: 9.4 mg/dL (ref 8.9–10.3)
Glucose, Bld: 108 mg/dL — ABNORMAL HIGH (ref 65–99)
Potassium: 3.4 mmol/L — ABNORMAL LOW (ref 3.5–5.1)
SODIUM: 137 mmol/L (ref 135–145)

## 2014-12-28 LAB — CBC
HCT: 45.2 % (ref 36.0–46.0)
Hemoglobin: 15.5 g/dL — ABNORMAL HIGH (ref 12.0–15.0)
MCH: 29.7 pg (ref 26.0–34.0)
MCHC: 34.3 g/dL (ref 30.0–36.0)
MCV: 86.6 fL (ref 78.0–100.0)
Platelets: 215 10*3/uL (ref 150–400)
RBC: 5.22 MIL/uL — AB (ref 3.87–5.11)
RDW: 12.2 % (ref 11.5–15.5)
WBC: 9.7 10*3/uL (ref 4.0–10.5)

## 2014-12-28 LAB — LIPASE, BLOOD: Lipase: 23 U/L (ref 22–51)

## 2014-12-28 LAB — I-STAT TROPONIN, ED: Troponin i, poc: 0 ng/mL (ref 0.00–0.08)

## 2014-12-28 LAB — D-DIMER, QUANTITATIVE: D-Dimer, Quant: <0.27 ug/mL-FEU (ref 0.00–0.48)

## 2014-12-28 MED ORDER — METHOCARBAMOL 500 MG PO TABS: 500.0000 mg | ORAL_TABLET | Freq: Two times a day (BID) | ORAL | Status: DC

## 2014-12-28 NOTE — ED Notes (Signed)
NP at bedside.

## 2014-12-28 NOTE — ED Notes (Signed)
Patient returned from XR. 

## 2014-12-28 NOTE — Discharge Instructions (Signed)
Cervical Radiculopathy Cervical radiculopathy means a nerve in the neck is pinched or bruised. This can cause pain or loss of feeling (numbness) that runs from your neck to your arm and fingers. HOME CARE   Put ice on the injured or painful area.  Put ice in a plastic bag.  Place a towel between your skin and the bag.  Leave the ice on for 15-20 minutes, 03-04 times a day, or as told by your doctor.  If ice does not help, you can try using heat. Take a warm shower or bath, or use a hot water bottle as told by your doctor.  You may try a gentle neck and shoulder massage.  Use a flat pillow when you sleep.  Only take medicines as told by your doctor.  Keep all physical therapy visits as told by your doctor.  If you are given a soft collar, wear it as told by your doctor. GET HELP RIGHT AWAY IF:   Your pain gets worse and is not controlled with medicine.  You lose feeling or feel weak in your hand, arm, face, or leg.  You have a fever or stiff neck.  You cannot control when you poop or pee (incontinence).  You have trouble with walking, balance, or speaking. MAKE SURE YOU:   Understand these instructions.  Will watch your condition.  Will get help right away if you are not doing well or get worse. Document Released: 03/20/2011 Document Revised: 06/23/2011 Document Reviewed: 03/20/2011 [facility] Patient Information 2015 [place], [place]. This information is not intended to replace advice given to you by your health care provider. Make sure you discuss any questions you have with your health care provider. Tonight your evaluated for your right-sided chest pain that radiated to your upper back, arm.  No definitive cause for the symptoms was identified, your cardiac markers, EKG and chest x-ray are all within normal parameters.  I feel that this may be stemming from your neck.  New been given a muscle relaxer for symptom relief.  Please make an appointment with your primary care  physician for further evaluation as needed.  Return if you develop chest pain with nausea, sweating, dizziness or shortness of breath

## 2014-12-28 NOTE — ED Provider Notes (Signed)
CSN: [id number]     Arrival date & time 12/28/14  0139 History   First MD Initiated Contact with Patient 12/28/14 0255     Chief Complaint  Patient presents with  . Chest Pain     (Consider location/radiation/quality/duration/timing/severity/associated sxs/prior Treatment) HPI Comments: This is a 44 year old female who presents with right-sided chest pain radiating into her scapular area as well as down her right arm or arm feels heavy.  She thought she would sleep at off, but again woke her.  She tried taking Excedrin without any relief.  She drove herself to the hospital.  Denies any nausea, diaphoresis. DVT risk factors include an IUD only.  She does not smoke.  She has not recently traveled.  She denies any previous history of DVTs or PEs any blood dyscrasias  Patient is a 44 y.o. female presenting with chest pain. The history is provided by the patient.  Chest Pain Pain location:  Substernal area and R chest Pain quality: aching   Pain radiates to:  Upper back, R shoulder and R arm Pain radiates to the back: yes   Pain severity:  Moderate Onset quality:  Gradual Timing:  Constant Progression:  Unchanged Chronicity:  New Context: movement, raising an arm, at rest and trauma   Context: not breathing and no drug use   Relieved by:  Nothing Worsened by:  Deep breathing, movement and certain positions Associated symptoms: back pain   Associated symptoms: no abdominal pain, no altered mental status, no anorexia, no anxiety, no claudication, no cough, no diaphoresis, no dizziness, no dysphagia, no fatigue, no fever, no heartburn, no lower extremity edema, no nausea, no near-syncope, no numbness, no palpitations, no shortness of breath, no syncope and no weakness   Risk factors: hypertension   Risk factors: no aortic disease, no birth control, no coronary artery disease, no diabetes mellitus, no immobilization, not female, no Marfan's syndrome, not obese, not pregnant, no prior DVT/PE, no  smoking and no surgery     Past Medical History  Diagnosis Date  . Hypertension   . Heart murmur   . Mental disorder   . Depression    Past Surgical History  Procedure Laterality Date  . Appendectomy     Family History  Problem Relation Age of Onset  . Cancer Mother     throat  . Stroke Maternal Grandmother   . Diabetes Maternal Grandmother   . Cancer Maternal Grandfather     prostate  . Heart disease Paternal Grandmother    Social History  Substance Use Topics  . Smoking status: Never Smoker   . Smokeless tobacco: None  . Alcohol Use: No   OB History    No data available     Review of Systems  Constitutional: Negative for fever, diaphoresis and fatigue.  HENT: Negative for trouble swallowing.   Respiratory: Negative for cough and shortness of breath.   Cardiovascular: Positive for chest pain. Negative for palpitations, claudication, syncope and near-syncope.  Gastrointestinal: Negative for heartburn, nausea, abdominal pain and anorexia.  Genitourinary: Negative for dysuria.  Musculoskeletal: Positive for back pain. Negative for myalgias.  Skin: Negative for rash.  Neurological: Negative for dizziness, syncope, weakness and numbness.  All other systems reviewed and are negative.     Allergies  Aleve and Ibuprofen  Home Medications   Prior to Admission medications   Medication Sig Start Date End Date Taking? Authorizing Provider  aspirin-acetaminophen-caffeine (EXCEDRIN MIGRAINE) [phone] MG per tablet Take 1 tablet by mouth every 6 (six)  hours as needed for headache.   Yes Historical Provider, MD  atenolol-chlorthalidone (TENORETIC) 50-25 MG per tablet Take 1 tablet by mouth daily. 12/12/14  Yes Historical Provider, MD  buPROPion (WELLBUTRIN XL) 150 MG 24 hr tablet Take 1 tablet by mouth 2 (two) times daily. 11/12/14  Yes Historical Provider, MD  hydrochlorothiazide (HYDRODIURIL) 25 MG tablet Take 1 tablet (25 mg total) by mouth daily. 04/12/13  Yes Patrecia Pour, NP  traZODone (DESYREL) 50 MG tablet Take 1 tablet (50 mg total) by mouth at bedtime as needed and may repeat dose one time if needed for sleep. 04/29/13  Yes Kathlee Nations, MD  atenolol (TENORMIN) 50 MG tablet Take 1 tablet (50 mg total) by mouth daily. Patient not taking: Reported on 12/28/2014 04/12/13   Patrecia Pour, NP  buPROPion (WELLBUTRIN XL) 300 MG 24 hr tablet Take 1 tablet (300 mg total) by mouth daily. Patient not taking: Reported on 12/28/2014 04/29/13   Kathlee Nations, MD  methocarbamol (ROBAXIN) 500 MG tablet Take 1 tablet (500 mg total) by mouth 2 (two) times daily. 12/28/14   Junius Creamer, NP  potassium chloride (K-DUR,KLOR-CON) 10 MEQ tablet Take 1 tablet (10 mEq total) by mouth daily. Patient not taking: Reported on 12/28/2014 04/12/13   Patrecia Pour, NP   BP 133/92 mmHg  Pulse 69  Temp(Src) 98.7 F (37.1 C) (Oral)  Resp 15  SpO2 98% Physical Exam  Constitutional: She is oriented to person, place, and time. She appears well-nourished.  Eyes: Pupils are equal, round, and reactive to light.  Neck: Normal range of motion.  Cardiovascular: Normal rate and regular rhythm.   Pulmonary/Chest: Effort normal and breath sounds normal. No respiratory distress. She has no wheezes. She exhibits no tenderness.  Abdominal: Soft. She exhibits no distension. There is tenderness in the epigastric area.  Musculoskeletal: Normal range of motion. She exhibits no edema.  Neurological: She is alert and oriented to person, place, and time. No cranial nerve deficit.  Skin: Skin is warm and dry. No rash noted.  Nursing note and vitals reviewed.   ED Course  Procedures (including critical care time) Labs Review Labs Reviewed  BASIC METABOLIC PANEL - Abnormal; Notable for the following:    Potassium 3.4 (*)    Chloride 99 (*)    Glucose, Bld 108 (*)    Creatinine, Ser 1.02 (*)    All other components within normal limits  CBC - Abnormal; Notable for the following:    RBC 5.22 (*)     Hemoglobin 15.5 (*)    All other components within normal limits  HEPATIC FUNCTION PANEL - Abnormal; Notable for the following:    ALT 9 (*)    Bilirubin, Direct <0.1 (*)    All other components within normal limits  D-DIMER, QUANTITATIVE (NOT AT [facility])  LIPASE, BLOOD  I-STAT TROPOININ, ED    Imaging Review No results found. I have personally reviewed and evaluated these images and lab results as part of my medical decision-making.   EKG Interpretation None     no definitive cause for chest pain has been identified, tonight she was ruled out for PE with a negative d-dimer.  Chest x-ray is normal.  EKG is normal.  Troponin is 0.  I feel that this might be cervical radiculopathy as she has had multiple spinal issues in the past.  She's been given a prescription for Robaxin to try and instruction to follow up with her primary care physician as  well as her orthopedic surgeon and neurologist  MDM   Final diagnoses:  Atypical chest pain  Cervical radiculopathy         Junius Creamer, NP 12/28/14 2897  Julianne Rice, MD 12/28/14 2336

## 2014-12-28 NOTE — ED Notes (Signed)
Pt states she has pain in her right chest that goes up into her shoulder, right arm, and into her back  Pt states the pain is making it hard breathe   Pt states the pain started about 2300 tonight

## 2016-04-23 DIAGNOSIS — I10 Essential (primary) hypertension: Secondary | ICD-10-CM | POA: Diagnosis not present

## 2016-04-23 DIAGNOSIS — Z124 Encounter for screening for malignant neoplasm of cervix: Secondary | ICD-10-CM | POA: Diagnosis not present

## 2016-04-23 DIAGNOSIS — Z23 Encounter for immunization: Secondary | ICD-10-CM | POA: Diagnosis not present

## 2016-04-23 DIAGNOSIS — M25521 Pain in right elbow: Secondary | ICD-10-CM | POA: Diagnosis not present

## 2016-04-23 DIAGNOSIS — Z0000 Encounter for general adult medical examination without abnormal findings: Secondary | ICD-10-CM | POA: Diagnosis not present

## 2016-06-21 DIAGNOSIS — S838X2A Sprain of other specified parts of left knee, initial encounter: Secondary | ICD-10-CM | POA: Diagnosis not present

## 2016-07-10 DIAGNOSIS — R402411 Glasgow coma scale score 13-15, in the field [EMT or ambulance]: Secondary | ICD-10-CM | POA: Diagnosis not present

## 2016-07-10 DIAGNOSIS — S060XAA Concussion with loss of consciousness status unknown, initial encounter: Secondary | ICD-10-CM | POA: Insufficient documentation

## 2016-07-10 DIAGNOSIS — S060X1A Concussion with loss of consciousness of 30 minutes or less, initial encounter: Secondary | ICD-10-CM | POA: Diagnosis not present

## 2016-07-10 DIAGNOSIS — G4489 Other headache syndrome: Secondary | ICD-10-CM | POA: Diagnosis not present

## 2016-07-10 HISTORY — DX: Concussion with loss of consciousness status unknown, initial encounter: S06.0XAA

## 2016-07-15 ENCOUNTER — Ambulatory Visit (INDEPENDENT_AMBULATORY_CARE_PROVIDER_SITE_OTHER): Admitting: Family Medicine

## 2016-07-15 ENCOUNTER — Telehealth: Payer: Self-pay | Admitting: *Deleted

## 2016-07-15 DIAGNOSIS — M47892 Other spondylosis, cervical region: Secondary | ICD-10-CM | POA: Diagnosis not present

## 2016-07-15 DIAGNOSIS — S060X9D Concussion with loss of consciousness of unspecified duration, subsequent encounter: Secondary | ICD-10-CM

## 2016-07-15 DIAGNOSIS — R55 Syncope and collapse: Secondary | ICD-10-CM | POA: Diagnosis not present

## 2016-07-15 DIAGNOSIS — M47812 Spondylosis without myelopathy or radiculopathy, cervical region: Secondary | ICD-10-CM | POA: Insufficient documentation

## 2016-07-15 DIAGNOSIS — S060X9A Concussion with loss of consciousness of unspecified duration, initial encounter: Secondary | ICD-10-CM | POA: Insufficient documentation

## 2016-07-15 HISTORY — DX: Concussion with loss of consciousness of unspecified duration, initial encounter: S06.0X9A

## 2016-07-15 MED ORDER — PREDNISONE 10 MG (21) PO TBPK: ORAL_TABLET | ORAL | 0 refills | Status: DC

## 2016-07-15 NOTE — Telephone Encounter (Signed)
Faxed worker's comp letters to [employer] or Shanon Brow, per patient request. Confirmation pages received at 2:14.

## 2016-07-15 NOTE — Assessment & Plan Note (Addendum)
Prednisone dose pack, continue heat.  Recheck in 1 week.

## 2016-07-15 NOTE — Progress Notes (Signed)
Subjective:    Patient ID: Renee Li, female    DOB: 19-Feb-1971, 46 y.o.   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   She was seen at [facility] with a cardiac workup, which did not fully explain her syncope.  She denies any symptoms before she fainted.  She does note that she had not eaten much that day. She denies nausea, vomiting, diarrhea that day.  She did lose consciousness for a minute or 2.  No convulsions.  Still has staples in head, placed last week.  Still having headache, having balance issues and decreased concentration.  She reports photophobia and phonophobia.  Has a history of migraines/depression.  No medications for this.    She has never had heart problems, except for a history of a heart murmur.  She has not had any symptoms of chest pain, shortness of breath, palpitations before and denies any during the event.  She was monitored at [facility], but only had sinus bradycardia during sleep.  She was not symptomatic during this episode.    She is also complaining of neck pain that has been ongoing since her accident.  She did have a CT c-spine, which showed spinal stenosis at C5/6 and C6/7.  No radicular pain or numbness/tingling.  She has not taken anything except tramadol.  The tramadol is making her sick to her stomach, so she stopped taking it.     PMHx - Updated and reviewed.  Contributory factors include: Migraines/depression PSHx - Updated and reviewed.  Contributory factors include:  Negative FHx - Updated and reviewed.  Contributory factors include:  Negative Social Hx - Updated and reviewed. Contributory factors include: Negative Medications - reviewed   Data review: ED visit at [facility] reviewed- CT head negative for intracranial lesions, but did have large scalp hematoma.  Cardiac monitor shows sinus bradycardia during sleep, but not otherwise.  Echo: normal EF, no  large heart valve defects, troponins neg, CMP, CBC WNL CT neck with cervical spinal stenosis  Review of Systems  Constitutional: Negative for chills, fatigue and fever.  Eyes: Positive for photophobia. Negative for visual disturbance.  Respiratory: Negative for cough, chest tightness and shortness of breath.   Cardiovascular: Negative for chest pain, palpitations and leg swelling.  Gastrointestinal: Negative for abdominal pain, diarrhea, nausea and vomiting.  Genitourinary: Negative for dysuria and urgency.  Musculoskeletal: Positive for neck pain. Negative for arthralgias and joint swelling.  Skin: Negative for rash and wound.  Neurological: Positive for syncope and headaches. Negative for dizziness, weakness, light-headedness and numbness.       Balance problems  Psychiatric/Behavioral: Negative for agitation and confusion.  All other systems reviewed and are negative.      Objective:   Physical Exam  Constitutional: She is oriented to person, place, and time. She appears well-developed and well-nourished. No distress.  HENT:  Head: Normocephalic and atraumatic.  Right Ear: External ear normal.  Left Ear: External ear normal.  Eyes: Conjunctivae and EOM are normal. Pupils are equal, round, and reactive to light.  +nystagmus with lateral eye movement, increased headache with movement of eyes  Neck: Normal range of motion. Neck supple. No thyromegaly present.  Cardiovascular: Normal rate, regular rhythm, normal heart sounds and intact distal pulses.  Exam reveals no gallop and no friction rub.   No murmur heard. Pulmonary/Chest: Effort normal and breath sounds normal. No respiratory distress. She has  no wheezes. She has no rales. She exhibits no tenderness.  Musculoskeletal: Normal range of motion. She exhibits no edema.  b/l UE and LE strength intact  Lymphadenopathy:    She has no cervical adenopathy.  Neurological: She is alert and oriented to person, place, and time. She  displays normal reflexes. No cranial nerve deficit. Coordination normal.  Skin: Skin is warm. No rash noted. She is not diaphoretic. No erythema.  Psychiatric: She has a normal mood and affect. Her behavior is normal. Judgment and thought content normal.  Decreased response time, but A&Ox3  Nursing note and vitals reviewed.   BP (!) 150/100 (BP Location: Right Arm, Patient Position: Sitting, Cuff Size: Normal)   Pulse (!) 58   Temp 98.8 F (37.1 C) (Oral)   Resp 16   Ht 5' 4.6" (1.641 m)   Wt 165 lb (74.8 kg)   SpO2 99%   BMI 27.80 kg/m        Assessment & Plan:  Osteoarthritis cervical spine Prednisone dose pack, continue heat.  Recheck in 1 week.    Syncope and collapse Unclear etiology.  Does not appear neurologic.  She had a negative echo, troponins.  Cardiac monitor with sinus bradycardia while sleeping, does not explain symptoms.  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    Concussion with loss of consciousness Still having concussion symptoms.  Reviewed normal history of concussion.  Cognitive/physical rest.  Recommend follow up in 1 week to reevaluate.

## 2016-07-15 NOTE — Assessment & Plan Note (Signed)
Still having concussion symptoms.  Reviewed normal history of concussion.  Cognitive/physical rest.  Recommend follow up in 1 week to reevaluate.

## 2016-07-15 NOTE — Assessment & Plan Note (Signed)
Unclear etiology.  Does not appear neurologic.  She had a negative echo, troponins.  Cardiac monitor with sinus bradycardia while sleeping, does not explain symptoms.  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

## 2016-07-15 NOTE — Patient Instructions (Addendum)
Please come back next week.  You can take excedrin migraine.  Prednisone dose pack.    Out of work until next week when we re-evaluate.    IF you received an x-ray today, you will receive an invoice from [facility]. Please contact [facility] at [phone] with questions or concerns regarding your invoice.   IF you received labwork today, you will receive an invoice from [place]. Please contact [facility] at [phone] with questions or concerns regarding your invoice.   Our billing staff will not be able to assist you with questions regarding bills from these companies.  You will be contacted with the lab results as soon as they are available. The fastest way to get your results is to activate your My Chart account. Instructions are located on the last page of this paperwork. If you have not heard from [place] regarding the results in 2 weeks, please contact this office.

## 2016-07-22 ENCOUNTER — Ambulatory Visit (INDEPENDENT_AMBULATORY_CARE_PROVIDER_SITE_OTHER): Admitting: Student

## 2016-07-22 ENCOUNTER — Encounter: Payer: Self-pay | Admitting: Student

## 2016-07-22 VITALS — BP 159/87 | HR 58 | Temp 98.9°F | Resp 16 | Ht 63.5 in | Wt 163.6 lb

## 2016-07-22 DIAGNOSIS — M47892 Other spondylosis, cervical region: Secondary | ICD-10-CM

## 2016-07-22 DIAGNOSIS — F331 Major depressive disorder, recurrent, moderate: Secondary | ICD-10-CM | POA: Diagnosis not present

## 2016-07-22 DIAGNOSIS — S0101XS Laceration without foreign body of scalp, sequela: Secondary | ICD-10-CM | POA: Diagnosis not present

## 2016-07-22 DIAGNOSIS — R55 Syncope and collapse: Secondary | ICD-10-CM | POA: Diagnosis not present

## 2016-07-22 DIAGNOSIS — S060X9D Concussion with loss of consciousness of unspecified duration, subsequent encounter: Secondary | ICD-10-CM

## 2016-07-22 MED ORDER — PREDNISONE 10 MG (21) PO TBPK: ORAL_TABLET | ORAL | 0 refills | Status: DC

## 2016-07-22 MED ORDER — BUPROPION HCL ER (SR) 150 MG PO TB12: 150.0000 mg | ORAL_TABLET | Freq: Two times a day (BID) | ORAL | 1 refills | Status: DC

## 2016-07-22 NOTE — Assessment & Plan Note (Signed)
Staples removed, follow up as needed.

## 2016-07-22 NOTE — Patient Instructions (Addendum)
     IF you received an x-ray today, you will receive an invoice from [facility]. Please contact [facility] at [phone] with questions or concerns regarding your invoice.   IF you received labwork today, you will receive an invoice from [facility]. Please contact [facility] at [phone] with questions or concerns regarding your invoice.   Our billing staff will not be able to assist you with questions regarding bills from these companies.  You will be contacted with the lab results as soon as they are available. The fastest way to get your results is to activate your My Chart account. Instructions are located on the last page of this paperwork. If you have not heard from us regarding the results in 2 weeks, please contact this office.    We recommend that you schedule a mammogram for breast cancer screening. Typically, you do not need a referral to do this. Please contact a local imaging center to schedule your mammogram.  [facility] - [phone]  *ask for the Radiology Department [facility] - [phone] or [phone]  [facility] - [phone] [facility] - [phone] [facility] - [phone]  *ask for the Radiology Department  [facility] - [phone]  *ask for the Radiology Department [facility] - [phone]  *ask for the Mammography Department [facility] - [phone] 

## 2016-07-22 NOTE — Assessment & Plan Note (Signed)
Still having significant symptoms from a concussion.  Exacerbated because of migraines/depression/episode associated with LOC.  Will follow closely, f/u 1 week.

## 2016-07-22 NOTE — Assessment & Plan Note (Signed)
Referral again placed to cardiology.  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

## 2016-07-22 NOTE — Progress Notes (Signed)
  Renee Li - 46 y.o. female MRN [id number]  Date of birth: July 20, 1970  SUBJECTIVE:  Including CC & ROS.  CC: f/u concussion  Having emotional liability, trouble sleeping.  She is having anxiety and having trouble at home.  She has not noticed any changes in her symptoms- has headache, photophobia, phonophobia.  Has noticed a recurrence of depression and anxiety.  No SI/HI.  She would like to know if she should go back on her depression meds.  She is having stress from her husband and her job.  She has been unable to get in with cardiology due to the fact that they did not take worker's comp.    She is due to have her staples out today.  She has had no problems with them.    She is still having neck pain, the prednisone only helped a little bit.  She denies any radicular pain, numbness, tingling, weakness.   ROS: No unexpected weight loss, fever, chills, swelling, instability, muscle pain, numbness/tingling, redness, otherwise see HPI   PMHx - Updated and reviewed.  Contributory factors include: h/o depression, migraines PSHx - Updated and reviewed.  Contributory factors include:  Negative FHx - Updated and reviewed.  Contributory factors include:  Negative Social Hx - Updated and reviewed. Contributory factors include: Negative Medications - reviewed   DATA REVIEWED: Previous office visits  PHYSICAL EXAM:  VS: BP:(!) 159/87  HR:(!) 58bpm  TEMP:98.9 F (37.2 C)(Oral)  RESP:98 %  HT:5' 3.5" (161.3 cm)   WT:163 lb 9.6 oz (74.2 kg)  BMI:28.6 PHYSICAL EXAM: Gen: NAD, alert, cooperative with exam, well-appearing HEENT: clear conjunctiva,  CV:  no edema, capillary refill brisk, normal rate Resp: non-labored Skin: no rashes, normal turgor  Neuro: no gross deficits.  Psych:  alert and oriented, teary eyed when first walked into room  3 Staples palpated in posterior right scalp with no surrounding erythema or bleeding.    ASSESSMENT & PLAN:   Syncope and collapse Referral  again placed to cardiology.  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  Concussion with loss of consciousness Still having significant symptoms from a concussion.  Exacerbated because of migraines/depression/episode associated with LOC.  Will follow closely, f/u 1 week.    Osteoarthritis cervical spine Will do another trial of prednisone dose pack.  Recommend PCP follow up for HTN.  Would not do NSAIDs until BP controlled.  Depression Will restart wellbutrin.  Follow up 1 week to recheck mood.  Scalp laceration, sequela Staples removed, follow up as needed.  Staples removed with mild difficulty 2/2 hair incorporation.  They were removed successfully after soaking in gauze.    Signed,  Balinda Quails, DO [facility] 4:27 PM

## 2016-07-22 NOTE — Assessment & Plan Note (Signed)
Will do another trial of prednisone dose pack.  Recommend PCP follow up for HTN.  Would not do NSAIDs until BP controlled.

## 2016-07-22 NOTE — Assessment & Plan Note (Signed)
Will restart wellbutrin.  Follow up 1 week to recheck mood.

## 2016-07-25 ENCOUNTER — Encounter: Payer: Self-pay | Admitting: Interventional Cardiology

## 2016-07-25 ENCOUNTER — Ambulatory Visit (INDEPENDENT_AMBULATORY_CARE_PROVIDER_SITE_OTHER): Admitting: Cardiology

## 2016-07-25 VITALS — BP 124/62 | HR 54 | Ht 63.5 in | Wt 161.0 lb

## 2016-07-25 DIAGNOSIS — R55 Syncope and collapse: Secondary | ICD-10-CM

## 2016-07-25 DIAGNOSIS — M76892 Other specified enthesopathies of left lower limb, excluding foot: Secondary | ICD-10-CM | POA: Diagnosis not present

## 2016-07-25 DIAGNOSIS — M2242 Chondromalacia patellae, left knee: Secondary | ICD-10-CM | POA: Diagnosis not present

## 2016-07-25 NOTE — Patient Instructions (Signed)
Medication Instructions:   NO CHANGE  Testing/Procedures:  Your physician has recommended that you wear an event monitor. Event monitors are medical devices that record the heart's electrical activity. Doctors most often [place] these monitors to diagnose arrhythmias. Arrhythmias are problems with the speed or rhythm of the heartbeat. The monitor is a small, portable device. You can wear one while you do your normal daily activities. This is usually used to diagnose what is causing palpitations/syncope (passing out).    Follow-Up:  Your physician recommends that you schedule a follow-up appointment in: [place]   If you need a refill on your cardiac medications before your next appointment, please call your pharmacy.

## 2016-07-25 NOTE — Progress Notes (Signed)
Cardiology Office Note   Date:  07/26/2016   ID:  Renee, Li May 25, 1970, MRN [id number]  PCP:  Maximino Greenland, MD  Cardiologist:   Minus Breeding, MD  Referring:  Maximino Greenland, MD  Chief Complaint  Patient presents with  . Loss of Consciousness      History of Present Illness: Renee Li is a 46 y.o. female who presents for evaluation of syncope. This happened to her in the end of March. 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 She stepped out of her truck and one to walk door and had a frank syncopal spell hitting her head. This was witnessed with apparent video as well. She had no prodrome. She was said to be out for 2 minutes or so and to be confused afterwards. She recalls being in the ambulance.  I did review her hospital records from [place].  Echocardiography was unremarkable. She had negative orthostatic BPs.    She otherwise feels well. She walks 24,000 steps per day. Rides an exercise bike. She denies any cardiovascular symptoms. The patient denies any new symptoms such as chest discomfort, neck or arm discomfort. There has been no new shortness of breath, PND or orthopnea. There have been no reported palpitations, presyncope or syncope.  She denies any orthostatic symptoms. She thinks she might of been hypoglycemic or maybe a little hydrated when it happened but we have no evidence of that. Of note in 2012 she passed out as well. I see some mention of that she was apparently hypokalemic and undergoing emotional stress.     Past Medical History:  Diagnosis Date  . Depression   . Heart murmur   . Hypertension     Past Surgical History:  Procedure Laterality Date  . APPENDECTOMY       Current Outpatient Prescriptions  Medication Sig Dispense Refill  . aspirin-acetaminophen-caffeine (EXCEDRIN MIGRAINE) 250-250-65 MG per tablet Take 1 tablet by mouth every 6 (six) hours as needed for headache.    Marland Kitchen atenolol (TENORMIN) 50 MG tablet Take 1 tablet  (50 mg total) by mouth daily. 30 tablet 0  . buPROPion (WELLBUTRIN SR) 150 MG 12 hr tablet Take 1 tablet (150 mg total) by mouth 2 (two) times daily. 60 tablet 1  . hydrochlorothiazide (HYDRODIURIL) 25 MG tablet Take 1 tablet (25 mg total) by mouth daily. 30 tablet 0  . predniSONE (STERAPRED UNI-PAK 21 TAB) 10 MG (21) TBPK tablet Take per package instructions 21 tablet 0  . traZODone (DESYREL) 50 MG tablet Take 1 tablet (50 mg total) by mouth at bedtime as needed and may repeat dose one time if needed for sleep. 30 tablet 0   No current facility-administered medications for this visit.     Allergies:   Aleve [naproxen] and Ibuprofen    Social History:  The patient  reports that she has never smoked. She has never used smokeless tobacco. She reports that she does not drink alcohol or use drugs.   Family History:  The patient's family history includes Cancer in her maternal grandfather and mother; Diabetes in her maternal grandmother; Heart disease in her paternal grandmother; Stroke in her maternal grandmother.    ROS:  Please see the history of present illness.   Otherwise, review of systems are positive for none.   All other systems are reviewed and negative.    PHYSICAL EXAM: VS:  BP 124/62   Pulse (!) 54   Ht 5' 3.5" (1.613 m)  Wt 161 lb (73 kg)   SpO2 98%   BMI 28.07 kg/m  , BMI Body mass index is 28.07 kg/m. GENERAL:  Well appearing HEENT:  Pupils equal round and reactive, fundi not visualized, oral mucosa unremarkable NECK:  No jugular venous distention, waveform within normal limits, carotid upstroke brisk and symmetric, no bruits, no thyromegaly LYMPHATICS:  No cervical, inguinal adenopathy LUNGS:  Clear to auscultation bilaterally BACK:  No CVA tenderness CHEST:  Unremarkable HEART:  PMI not displaced or sustained,S1 and S2 within normal limits, no S3, no S4, no clicks, no rubs, no murmurs ABD:  Flat, positive bowel sounds normal in frequency in pitch, no bruits, no  rebound, no guarding, no midline pulsatile mass, no hepatomegaly, no splenomegaly EXT:  2 plus pulses throughout, no edema, no cyanosis no clubbing SKIN:  No rashes no nodules NEURO:  Cranial nerves II through XII grossly intact, motor grossly intact throughout PSYCH:  Cognitively intact, oriented to person place and time    EKG:  EKG is ordered today. The ekg ordered today demonstrates sinus rhythm, rate 61, axis within normal limits, intervals within normal limits, possible right atrial enlargement   Recent Labs: No results found for requested labs within last 8760 hours.    Lipid Panel No results found for: CHOL, TRIG, HDL, CHOLHDL, VLDL, LDLCALC, LDLDIRECT    Wt Readings from Last 3 Encounters:  07/25/16 161 lb (73 kg)  07/22/16 163 lb 9.6 oz (74.2 kg)  07/15/16 165 lb (74.8 kg)      Other studies Reviewed: Additional studies/ records that were reviewed today include: [facility] Records on CareEverywhere. Review of the above records demonstrates:  Please see elsewhere in the note.     ASSESSMENT AND PLAN:  SYNCOPE:  The patient had a witnessed event but without clear etiology. Workup thus far has been negative. We talked about his driving for 6 months with undiagnosed syncope. She's going to get an event monitor. Trinna Balloon will need a 21 day event monitor.  The patients symptoms necessitate an event monitor.  The symptoms are too infrequent to be identified on a Holter monitor.     Current medicines are reviewed at length with the patient today.  The patient does not have concerns regarding medicines.  The following changes have been made:  no change  Labs/ tests ordered today include:   Orders Placed This Encounter  Procedures  . Cardiac event monitor  . EKG 12-Lead     Disposition:   FU with me after the event recorder.     Signed, Minus Breeding, MD  07/26/2016 3:59 PM    [facility]

## 2016-07-26 ENCOUNTER — Encounter: Payer: Self-pay | Admitting: Cardiology

## 2016-07-29 ENCOUNTER — Ambulatory Visit: Payer: Self-pay | Admitting: Interventional Cardiology

## 2016-07-29 ENCOUNTER — Ambulatory Visit (INDEPENDENT_AMBULATORY_CARE_PROVIDER_SITE_OTHER): Admitting: Family Medicine

## 2016-07-29 VITALS — BP 124/80 | HR 66 | Temp 98.9°F | Resp 18 | Ht 63.5 in | Wt 163.0 lb

## 2016-07-29 DIAGNOSIS — R55 Syncope and collapse: Secondary | ICD-10-CM | POA: Diagnosis not present

## 2016-07-29 DIAGNOSIS — S060X9D Concussion with loss of consciousness of unspecified duration, subsequent encounter: Secondary | ICD-10-CM | POA: Diagnosis not present

## 2016-07-29 DIAGNOSIS — F331 Major depressive disorder, recurrent, moderate: Secondary | ICD-10-CM

## 2016-07-29 NOTE — Assessment & Plan Note (Signed)
Doing well, much improved from last week.  Emotions improved, decreased headache.  She will likely improve without intervention.

## 2016-07-29 NOTE — Assessment & Plan Note (Addendum)
Per cardiology- 6 months without driving.  Worker's comp eval form completed.  21 day holter monitor.  Will do neurologic evaluation to rule out any neurologic causes as well.

## 2016-07-29 NOTE — Assessment & Plan Note (Signed)
Continue wellbutrin, recommend PCP follow up.  Much improved from last week.  Likely had exacerbation from concussion and life stressors.

## 2016-07-29 NOTE — Progress Notes (Signed)
   Subjective:    Patient ID: Renee Li, female    DOB: 1970/12/22, 46 y.o.   MRN: [id number]  HPI Presents in follow up for syncope with unknown etiology and concussion that is resolving.  In terms of her syncope, she has not had any further episodes.  She did meet with Cardiology, who stated that she has an unknown etiology for her syncope so she cannot drive for 6 months.   She is doing better from her concussion- little headache, fogginess.  She feels more herself.  Her mood is improved and has less emotional liability.  She has started wellbutrin and is doing well on this.  No SI/HI  PMHx - Updated and reviewed.  Contributory factors include: Negative PSHx - Updated and reviewed.  Contributory factors include:  Negative FHx - Updated and reviewed.  Contributory factors include:  Negative Social Hx - Updated and reviewed. Contributory factors include: Negative Medications - reviewed    Review of Systems  Constitutional: Negative for chills, fatigue and fever.  HENT: Negative for congestion and rhinorrhea.   Eyes: Negative for photophobia and visual disturbance.  Respiratory: Negative for cough, chest tightness and shortness of breath.   Cardiovascular: Negative for chest pain, palpitations and leg swelling.  Gastrointestinal: Negative for abdominal pain, nausea and vomiting.  Genitourinary: Negative for dysuria and urgency.  Musculoskeletal: Negative for arthralgias and joint swelling.  Skin: Negative for rash and wound.  Neurological: Positive for syncope and headaches. Negative for dizziness, tremors, seizures, weakness, light-headedness and numbness.  Psychiatric/Behavioral: Negative for agitation and confusion.  All other systems reviewed and are negative.      Objective:   Physical Exam  Constitutional: She is oriented to person, place, and time. She appears well-developed and well-nourished. No distress.  HENT:  Head: Normocephalic and atraumatic.  Right Ear:  External ear normal.  Left Ear: External ear normal.  Neck: Normal range of motion. Neck supple.  Pulmonary/Chest: Effort normal. No respiratory distress.  Musculoskeletal: Normal range of motion. She exhibits no edema.  Neurological: She is alert and oriented to person, place, and time. No cranial nerve deficit. Coordination normal.  Skin: Skin is warm. No rash noted. She is not diaphoretic. No erythema.  Psychiatric: She has a normal mood and affect. Her behavior is normal. Judgment and thought content normal.  Nursing note and vitals reviewed.  BP 124/80   Pulse 66   Temp 98.9 F (37.2 C) (Oral)   Resp 18   Ht 5' 3.5" (1.613 m)   Wt 163 lb (73.9 kg)   SpO2 97%   BMI 28.42 kg/m         Assessment & Plan:  Syncope and collapse Per cardiology- 6 months without driving.  Worker's comp eval form completed.  21 day holter monitor.  Will do neurologic evaluation to rule out any neurologic causes as well.  Concussion with loss of consciousness Doing well, much improved from last week.  Emotions improved, decreased headache.  She will likely improve without intervention.    Depression Continue wellbutrin, recommend PCP follow up.  Much improved from last week.  Likely had exacerbation from concussion and life stressors.  Signed,  Balinda Quails, DO [facility] 7:45 PM

## 2016-07-29 NOTE — Patient Instructions (Signed)
     IF you received an x-ray today, you will receive an invoice from [facility]. Please contact [facility] at [phone] with questions or concerns regarding your invoice.   IF you received labwork today, you will receive an invoice from [facility]. Please contact [facility] at [phone] with questions or concerns regarding your invoice.   Our billing staff will not be able to assist you with questions regarding bills from these companies.  You will be contacted with the lab results as soon as they are available. The fastest way to get your results is to activate your My Chart account. Instructions are located on the last page of this paperwork. If you have not heard from us regarding the results in 2 weeks, please contact this office.     

## 2016-08-04 ENCOUNTER — Ambulatory Visit (INDEPENDENT_AMBULATORY_CARE_PROVIDER_SITE_OTHER)

## 2016-08-04 DIAGNOSIS — R55 Syncope and collapse: Secondary | ICD-10-CM

## 2016-08-08 ENCOUNTER — Ambulatory Visit: Payer: Self-pay | Admitting: Cardiovascular Disease

## 2016-08-14 ENCOUNTER — Telehealth: Payer: Self-pay | Admitting: Student

## 2016-08-14 NOTE — Telephone Encounter (Signed)
Returned pt call about Neurology referral. Pt called to let us know that [facility] in [facility] may not be able to see her because their doctor who accepts W/C insurance is leaving in July. Pt advised to contact her case worker to see what they want to do since all of the neurologists in the [place] we have tried do not take W/C insurance. Pt also said she would follow up with [facility] and see if they could fit her in before July. Pt returned back to work today. Pt says the day she fainted at work was very hot and she noticed the heat while back at work. She said she has been told that there is heat syncope and wondered if that has anything to do with her falling. Pt was wondering if heat syncope is what happened instead of syncope and if that would overturn the other diagnosis. Pt wanted to discuss this with Dr. Drema Dallas and just wanted to mention this in case this related to it. Pt said if she needs to make an appt to discuss this she will. Best pt callback number is [phone].

## 2016-08-14 NOTE — Telephone Encounter (Signed)
Does she need a f/u to discuss?

## 2016-08-15 NOTE — Telephone Encounter (Signed)
l/m with dr Drema Dallas note and to make an appt

## 2016-09-25 NOTE — Progress Notes (Signed)
Cardiology Office Note   Date:  09/26/2016   ID:  Renee, Li [birth year], MRN [id number]  PCP:  Glendale Chard, MD  Cardiologist:   Minus Breeding, MD  Referring:  Glendale Chard, MD  Chief Complaint  Patient presents with  . Follow-up    2 months. No complaints.  . Loss of Consciousness      History of Present Illness: Renee Li is a 46 y.o. female who presents for evaluation of syncope.  I saw her recently and this is described in the previous note.  An event recorder demonstrated no significant arrhythmia.  Since then she has done well.  The patient denies any new symptoms such as chest discomfort, neck or arm discomfort. There has been no new shortness of breath, PND or orthopnea. There have been no reported palpitations, presyncope or syncope.  She is working but not driving yet.    Past Medical History:  Diagnosis Date  . Depression   . Heart murmur   . Hypertension     Past Surgical History:  Procedure Laterality Date  . APPENDECTOMY       Current Outpatient Prescriptions  Medication Sig Dispense Refill  . aspirin-acetaminophen-caffeine (EXCEDRIN MIGRAINE) 250-250-65 MG per tablet Take 1 tablet by mouth every 6 (six) hours as needed for headache.    Marland Kitchen atenolol (TENORMIN) 50 MG tablet Take 1 tablet (50 mg total) by mouth daily. 30 tablet 0  . buPROPion (WELLBUTRIN SR) 150 MG 12 hr tablet Take 1 tablet (150 mg total) by mouth 2 (two) times daily. 60 tablet 1  . hydrochlorothiazide (HYDRODIURIL) 25 MG tablet Take 1 tablet (25 mg total) by mouth daily. 30 tablet 0  . traZODone (DESYREL) 50 MG tablet Take 1 tablet (50 mg total) by mouth at bedtime as needed and may repeat dose one time if needed for sleep. 30 tablet 0   No current facility-administered medications for this visit.     Allergies:   Aleve [naproxen] and Ibuprofen    ROS:  Please see the history of present illness.   Otherwise, review of systems are positive for none.   All  other systems are reviewed and negative.    PHYSICAL EXAM: VS:  BP (!) 146/94   Pulse 61   Ht 5' 3.5" (1.613 m)   Wt 168 lb (76.2 kg)   BMI 29.29 kg/m  , BMI Body mass index is 29.29 kg/m.  GENERAL:  Well appearing NECK:  No jugular venous distention, waveform within normal limits, carotid upstroke brisk and symmetric, no bruits, no thyromegaly LUNGS:  Clear to auscultation bilaterally BACK:  No CVA tenderness CHEST:  Unremarkable HEART:  PMI not displaced or sustained,S1 and S2 within normal limits, no S3, no S4, no clicks, no rubs, no murmurs ABD:  Flat, positive bowel sounds normal in frequency in pitch, no bruits, no rebound, no guarding, no midline pulsatile mass, no hepatomegaly, no splenomegaly EXT:  2 plus pulses throughout, no edema, no cyanosis no clubbing   EKG:  EKG is not ordered today.   Recent Labs: No results found for requested labs within last 8760 hours.    Lipid Panel No results found for: CHOL, TRIG, HDL, CHOLHDL, VLDL, LDLCALC, LDLDIRECT    Wt Readings from Last 3 Encounters:  09/26/16 168 lb (76.2 kg)  07/29/16 163 lb (73.9 kg)  07/25/16 161 lb (73 kg)      Other studies Reviewed: Additional studies/ records that were reviewed today include:  Event monitor.  Review of the above records demonstrates:  As above.    ASSESSMENT AND PLAN:  SYNCOPE:  The patient had a witnessed event but without clear etiology. Workup thus far has been negative. Event monitor demonstrated no significant arrhythmia.  She has had no recurrent syncope or presyncope.  No further work up is planned  HTN:  The BP is mildly elevated but this is unusual.  It was well controlled at previous readings in April.  She can have this followed by her PCP.     Current medicines are reviewed at length with the patient today.  The patient does not have concerns regarding medicines.  The following changes have been made:  None  Labs/ tests ordered today include: None  No orders of  the defined types were placed in this encounter.    Disposition:   FU with me as needed.  Ronnell Guadalajara, MD  09/26/2016 12:51 PM    [facility]

## 2016-09-26 ENCOUNTER — Ambulatory Visit (INDEPENDENT_AMBULATORY_CARE_PROVIDER_SITE_OTHER): Admitting: Cardiology

## 2016-09-26 ENCOUNTER — Encounter: Payer: Self-pay | Admitting: Cardiology

## 2016-09-26 VITALS — BP 146/94 | HR 61 | Ht 63.5 in | Wt 168.0 lb

## 2016-09-26 DIAGNOSIS — R55 Syncope and collapse: Secondary | ICD-10-CM | POA: Diagnosis not present

## 2016-09-26 NOTE — Patient Instructions (Signed)
Medication Instructions:  Continue current medications  Labwork: None Ordered  Testing/Procedures: None Ordered  Follow-Up: Your physician recommends that you schedule a follow-up appointment in: As Needed   Any Other Special Instructions Will Be Listed Below (If Applicable).   If you need a refill on your cardiac medications before your next appointment, please call your pharmacy.   

## 2016-09-27 ENCOUNTER — Encounter: Payer: Self-pay | Admitting: Family Medicine

## 2016-09-27 ENCOUNTER — Ambulatory Visit (INDEPENDENT_AMBULATORY_CARE_PROVIDER_SITE_OTHER): Admitting: Family Medicine

## 2016-09-27 VITALS — BP 136/86 | HR 61 | Temp 98.8°F | Resp 16 | Ht 63.75 in | Wt 170.0 lb

## 2016-09-27 DIAGNOSIS — M542 Cervicalgia: Secondary | ICD-10-CM

## 2016-09-27 DIAGNOSIS — M5412 Radiculopathy, cervical region: Secondary | ICD-10-CM | POA: Diagnosis not present

## 2016-09-27 DIAGNOSIS — M62838 Other muscle spasm: Secondary | ICD-10-CM

## 2016-09-27 DIAGNOSIS — S060X0D Concussion without loss of consciousness, subsequent encounter: Secondary | ICD-10-CM | POA: Diagnosis not present

## 2016-09-27 DIAGNOSIS — R55 Syncope and collapse: Secondary | ICD-10-CM

## 2016-09-27 MED ORDER — CYCLOBENZAPRINE HCL 5 MG PO TABS: ORAL_TABLET | ORAL | 0 refills | Status: DC

## 2016-09-27 NOTE — Progress Notes (Signed)
By signing my name below, I, Mesha Guinyard, attest that this documentation has been prepared under the direction and in the presence of Merri Ray, MD.  Electronically Signed: Verlee Monte, Medical Scribe. 09/27/16. 8:21 AM.  Subjective:    Patient ID: Renee Li, female    DOB: February 08, 1971, 46 y.o.   MRN: [id number]  HPI Chief Complaint  Patient presents with  . Loss of Consciousness    WC    HPI Comments: Renee Li is a 46 y.o. female who presents to Primary Care at [facility] for a follow-up from an injury that occurred at work. Date of injury Thursday March 29th. She was initially seen April 3rd. 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 Had an episode of syncope and hit her head on the side walk. She was initially seen through [facility] for evaluation of syncope. She had a CT of her cervical spine due to neck pain after incident, as well as CT head. There was a large hematoma overlying right parietal bone. No intercranial hemorrhage, no fractures of cervical spine, but she did have DDD in the mid lower cervical spine. She had a cardiac echo, nl Ef, wall motion. She is monitored on telemetry without arrhythmia except sinus bradycardia. She was treated with prednisone with her visit on 3/18 for neck pain. Referred to cardiology to evaluate the syncopal episode, and diagnose with concussion. Follow-up April 10th, some emotional lability, trouble sleeping, staples removed from scalp, she was restarted on her previous wellbutrin. Repeat prednisone for neck pain, cardiac evaluation April 13th. Planned on event monitor. Follow-up April 17th; improving HA sxs, improving mood. Telephone note from May 3rd noted. Her event recorder demonstrated no arrhythmia,no further work-up from cardiology from this standpoint. BP had been borderline elevated, advised to follow-up with PCP. Previous notes reviewed.  Pt feels at 50% recovery. Before the day of injury, March 12th, pt fell in snow and she was  already on limitations from previous knee injury. During the day of injury, she was wearing a knee brace while working, she got out of a truck, walked to scan "location" bar code, and the next thing she remembers is waking up in the ambulance - no tremors, bowel/bladder incontinence, or bleeding from the mouth. Pt was hot in the building she worked in before driving but felt fine while driving in the truck, not hot, light-headed, or dizzy, but she was hungry since she hasn't eaten that much. Pt's HA intensity and mood has improved but her neck pain now radiates from her upper right back to her right shoulder and right elbow. Pt takes tylenol 8-12x a day (light duty 4-8 tabs, working 4 hours about 12 tabs), biofreeze, massage, and heat pack for relief of her sxs. Pt now has 1-2 day long HAs a week she can work through with relief with excedrine migraine, and she used to have HAs about 3x a month. She reports she uses her right shoulder to open the door frequently while working and she's never had this pain before her injury at work and she has some feeling of weakness. Pt had a syncopal episode 6 years ago due to medication adjustment and low potassium - no EMS involved and her medication had been adjusted. Pt's potassium was nl when she went to the hospital. Pt still hasn't seen a neurologist yet  She no longer takes tramadol. Pt doesn't drink or smoke. Deneis seizures, recent syncopal episodes, light-headedness, dizziness  Allergies  Allergen Reactions  . Aleve [Naproxen] Hives  .  Ibuprofen Hives   Review of Systems  Musculoskeletal: Positive for arthralgias and neck pain.  Neurological: Positive for weakness and headaches. Negative for dizziness, tremors, seizures, syncope and light-headedness.   Objective:  Physical Exam  Constitutional: She appears well-developed and well-nourished. No distress.  HENT:  Head: Normocephalic and atraumatic.  Eyes: Conjunctivae are normal.  Neck: Neck supple.    Cardiovascular: Normal rate.   Pulmonary/Chest: Effort normal.  Musculoskeletal:  Tender along right paraspinal cervical spine Tender along right Romberg No bony tenderness Slight dec extension  Slight dec left rotation Equal lateral flexion  Neurological: She is alert. She displays a negative Romberg sign.  Reflex Scores:      Tricep reflexes are 2+ on the right side and 2+ on the left side.      Bicep reflexes are 1+ on the right side and 2+ on the left side.      Brachioradialis reflexes are 1+ on the right side and 2+ on the left side. Equal upper extremity strength Nl finger to nose Nl heel to toe No pronator drift  Skin: Skin is warm and dry.  Psychiatric: She has a normal mood and affect. Her behavior is normal.  Nursing note and vitals reviewed.   Vitals:   09/27/16 0805  BP: 136/86  Pulse: 61  Resp: 16  Temp: 98.8 F (37.1 C)  TempSrc: Oral  SpO2: 98%  Weight: 170 lb (77.1 kg)  Height: 5' 3.75" (1.619 m)  Body mass index is 29.41 kg/m. Assessment & Plan:  Over 25 mins of care, plus review of records with over 50% counseling time. Renee Li is a 46 y.o. female Concussion without loss of consciousness, subsequent encounter - Plan: Ambulatory referral to Neurology Syncope, unspecified syncope type - Plan: Ambulatory referral to Neurology  - Unknown cause of syncope that occurred at work with subsequent contusion/concussion of head. Has remained on driving restrictions. No apparent cause from a cardiac standpoint. Waiting on neurology evaluation. Overall tolerating current work with exception of holding on right side with worse neck and arm pain.  -Repeat neurology evaluation ordered or check into status a previous order.  -Limitations were adjusted for her right arm pain.  Neck pain on right side - Plan: cyclobenzaprine (FLEXERIL) 5 MG tablet, Ambulatory referral to Orthopedic Surgery Muscle spasm - Plan: cyclobenzaprine (FLEXERIL) 5 MG tablet Right  cervical radiculopathy - Plan: Ambulatory referral to Orthopedic Surgery  -Flexeril if needed, refer to orthopedics for evaluation of suspected cervical radiculopathy.  Meds ordered this encounter  Medications  . cyclobenzaprine (FLEXERIL) 5 MG tablet    Sig: 1 pill by mouth up to every 8 hours as needed. Start with one pill by mouth each bedtime as needed due to sedation    Dispense:  15 tablet    Refill:  0   Patient Instructions   Do not exceed maximum dosing of Tylenol over-the-counter. Continue heat or ice, gentle range of motion as tolerated for neck pain. I have also written for muscle relaxant to be taken at bedtime. Be careful combining that with other sedating medicines. It can also be taken up to every 8 hours during the day, but can cause sedation, so try that at night to begin with.  I will try to refer you again to neurology for the initial collapse and headaches. I also have referred you to orthopedics for your neck pain.  Return to the clinic or go to the nearest emergency room if any of your symptoms worsen or  new symptoms occur.     IF you received an x-ray today, you will receive an invoice from [facility]. Please contact [facility] at [phone] with questions or concerns regarding your invoice.   IF you received labwork today, you will receive an invoice from [place]. Please contact [facility] at [phone] with questions or concerns regarding your invoice.   Our billing staff will not be able to assist you with questions regarding bills from these companies.  You will be contacted with the lab results as soon as they are available. The fastest way to get your results is to activate your My Chart account. Instructions are located on the last page of this paperwork. If you have not heard from [place] regarding the results in 2 weeks, please contact this office.      I personally performed the services described in this documentation, which was  scribed in my presence. The recorded information has been reviewed and considered for accuracy and completeness, addended by me as needed, and agree with information above.  Signed,   Merri Ray, MD [facility] at [place].  09/28/16 5:09 PM

## 2016-09-27 NOTE — Patient Instructions (Addendum)
Do not exceed maximum dosing of Tylenol over-the-counter. Continue heat or ice, gentle range of motion as tolerated for neck pain. I have also written for muscle relaxant to be taken at bedtime. Be careful combining that with other sedating medicines. It can also be taken up to every 8 hours during the day, but can cause sedation, so try that at night to begin with.  I will try to refer you again to neurology for the initial collapse and headaches. I also have referred you to orthopedics for your neck pain.  Return to the clinic or go to the nearest emergency room if any of your symptoms worsen or new symptoms occur.     IF you received an x-ray today, you will receive an invoice from [facility]. Please contact [facility] at [phone] with questions or concerns regarding your invoice.   IF you received labwork today, you will receive an invoice from [place]. Please contact [facility] at [phone] with questions or concerns regarding your invoice.   Our billing staff will not be able to assist you with questions regarding bills from these companies.  You will be contacted with the lab results as soon as they are available. The fastest way to get your results is to activate your My Chart account. Instructions are located on the last page of this paperwork. If you have not heard from [place] regarding the results in 2 weeks, please contact this office.

## 2016-10-14 ENCOUNTER — Ambulatory Visit (INDEPENDENT_AMBULATORY_CARE_PROVIDER_SITE_OTHER)

## 2016-10-14 ENCOUNTER — Ambulatory Visit (INDEPENDENT_AMBULATORY_CARE_PROVIDER_SITE_OTHER): Admitting: Orthopaedic Surgery

## 2016-10-14 ENCOUNTER — Encounter (INDEPENDENT_AMBULATORY_CARE_PROVIDER_SITE_OTHER): Payer: Self-pay | Admitting: Orthopaedic Surgery

## 2016-10-14 ENCOUNTER — Telehealth (INDEPENDENT_AMBULATORY_CARE_PROVIDER_SITE_OTHER): Payer: Self-pay | Admitting: Radiology

## 2016-10-14 DIAGNOSIS — M47892 Other spondylosis, cervical region: Secondary | ICD-10-CM

## 2016-10-14 DIAGNOSIS — M542 Cervicalgia: Secondary | ICD-10-CM | POA: Diagnosis not present

## 2016-10-14 NOTE — Telephone Encounter (Signed)
Needs w/c approval for MRI

## 2016-10-14 NOTE — Progress Notes (Signed)
Office Visit Note   Patient: Renee Li           Date of Birth: 29-Apr-1970           MRN: [id number] Visit Date: 10/14/2016              Requested by: [name], MD [address] [place], [place] [address] PCP: Glendale Chard, MD   Assessment & Plan: Visit Diagnoses:  1. Cervicalgia     Plan: Patient has failed extensive conservative treatment she has multilevel degenerative disc disease. MRI of the cervical spine ordered. Continue modified duty. Follow-up after MRI.  Follow-Up Instructions: Return if symptoms worsen or fail to improve.   Orders:  Orders Placed This Encounter  Procedures  . XR Cervical Spine 2 or 3 views  . MR Cervical Spine w/o contrast   No orders of the defined types were placed in this encounter.     Procedures: No procedures performed   Clinical Data: No additional findings.   Subjective: Chief Complaint  Patient presents with  . Neck - Pain, Injury    Patient is a 46 year old female comes in with reaming half month history of neck and right arm pain since she fell while on the job. She endorses numbness and tingling that shoots down all the way to her hand. She denies any focal deficits. She has taken 2 rounds of prednisone and currently is doing modified duty at the [employer].    Review of Systems  Constitutional: Negative.   HENT: Negative.   Eyes: Negative.   Respiratory: Negative.   Cardiovascular: Negative.   Endocrine: Negative.   Musculoskeletal: Negative.   Neurological: Negative.   Hematological: Negative.   Psychiatric/Behavioral: Negative.   All other systems reviewed and are negative.    Objective: Vital Signs: There were no vitals taken for this visit.  Physical Exam  Constitutional: She is oriented to person, place, and time. She appears well-developed and well-nourished.  HENT:  Head: Normocephalic and atraumatic.  Eyes: EOM are normal.  Neck: Neck supple.  Pulmonary/Chest: Effort  normal.  Abdominal: Soft.  Neurological: She is alert and oriented to person, place, and time.  Skin: Skin is warm. Capillary refill takes less than 2 seconds.  Psychiatric: She has a normal mood and affect. Her behavior is normal. Judgment and thought content normal.  Nursing note and vitals reviewed.   Ortho Exam Cervical neck exam shows no deformities on palpation. She has normal range of motion of the cervical spine. She has negative Spurling's. She has no focal motor or sensory deficits distally. Specialty Comments:  No specialty comments available.  Imaging: Xr Cervical Spine 2 Or 3 Views  Result Date: 10/14/2016 Multilevel degenerative disc disease with bridging osteophytes.    PMFS History: Patient Active Problem List   Diagnosis Date Noted  . Cervicalgia 10/14/2016  . Scalp laceration, sequela 07/22/2016  . Concussion with loss of consciousness 07/15/2016  . Osteoarthritis cervical spine 07/15/2016  . Syncope and collapse 07/15/2016  . Depression 04/09/2013   Past Medical History:  Diagnosis Date  . Depression   . Heart murmur   . Hypertension     Family History  Problem Relation Age of Onset  . Cancer Mother        throat  . Stroke Maternal Grandmother   . Diabetes Maternal Grandmother   . Cancer Maternal Grandfather        prostate  . Heart disease Paternal Grandmother     Past Surgical  History:  Procedure Laterality Date  . 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 Smoking status: Never Smoker  . Smokeless tobacco: Never Used  . Alcohol use No  . Drug use: No  . Sexual activity: Yes

## 2016-10-20 NOTE — Telephone Encounter (Signed)
Submitted auth request to dept of labor. See referral.

## 2016-10-22 DIAGNOSIS — S060X0S Concussion without loss of consciousness, sequela: Secondary | ICD-10-CM | POA: Diagnosis not present

## 2016-10-22 DIAGNOSIS — I10 Essential (primary) hypertension: Secondary | ICD-10-CM | POA: Diagnosis not present

## 2016-11-26 ENCOUNTER — Other Ambulatory Visit: Payer: Self-pay | Admitting: Nurse Practitioner

## 2016-11-26 DIAGNOSIS — Z1231 Encounter for screening mammogram for malignant neoplasm of breast: Secondary | ICD-10-CM

## 2016-12-03 ENCOUNTER — Ambulatory Visit
Admission: RE | Admit: 2016-12-03 | Discharge: 2016-12-03 | Disposition: A | Discharge status: Home / Self Care | Payer: 59 | Source: Ambulatory Visit | Attending: Nurse Practitioner | Admitting: Nurse Practitioner

## 2016-12-03 DIAGNOSIS — Z1231 Encounter for screening mammogram for malignant neoplasm of breast: Secondary | ICD-10-CM

## 2016-12-20 ENCOUNTER — Other Ambulatory Visit: Payer: Self-pay | Admitting: Family Medicine

## 2016-12-20 DIAGNOSIS — M62838 Other muscle spasm: Secondary | ICD-10-CM

## 2016-12-20 DIAGNOSIS — M542 Cervicalgia: Secondary | ICD-10-CM

## 2016-12-22 NOTE — Telephone Encounter (Signed)
Please advise 

## 2017-01-12 ENCOUNTER — Ambulatory Visit (INDEPENDENT_AMBULATORY_CARE_PROVIDER_SITE_OTHER): Admitting: Family Medicine

## 2017-01-12 ENCOUNTER — Telehealth: Payer: Self-pay | Admitting: Family Medicine

## 2017-01-12 ENCOUNTER — Encounter: Payer: Self-pay | Admitting: Family Medicine

## 2017-01-12 VITALS — BP 128/92 | HR 90 | Temp 99.2°F | Resp 16 | Ht 63.75 in | Wt 171.4 lb

## 2017-01-12 DIAGNOSIS — Z23 Encounter for immunization: Secondary | ICD-10-CM | POA: Diagnosis not present

## 2017-01-12 DIAGNOSIS — M542 Cervicalgia: Secondary | ICD-10-CM

## 2017-01-12 DIAGNOSIS — R55 Syncope and collapse: Secondary | ICD-10-CM

## 2017-01-12 DIAGNOSIS — M62838 Other muscle spasm: Secondary | ICD-10-CM | POA: Diagnosis not present

## 2017-01-12 MED ORDER — CYCLOBENZAPRINE HCL 5 MG PO TABS: ORAL_TABLET | ORAL | 0 refills | Status: DC

## 2017-01-12 NOTE — Telephone Encounter (Signed)
error 

## 2017-01-12 NOTE — Patient Instructions (Addendum)
Continue  Tylenol with over-the-counter as needed for pain in neck or arm, heat or ice to the back of the neck as needed. Flexeril at bedtime if needed, but do not use that medication if it is causing sleepiness the next day. Do not drive if you are sleepy. As you have not had any return of lightheadedness, dizziness, or feelings of passing out, I will returning to driving. Recheck with me in 2 weeks.  I will completed a letter as requested, that should be ready later this week.  You will likely still need to follow-up with orthopedics, but we can determine further plans at next visit. Let me know if you have questions or concerns in the meantime, and follow-up sooner if you have any difficulty with restrictions provided today.    IF you received an x-ray today, you will receive an invoice from [facility]. Please contact [facility] at [phone] with questions or concerns regarding your invoice.   IF you received labwork today, you will receive an invoice from [place]. Please contact [facility] at [phone] with questions or concerns regarding your invoice.   Our billing staff will not be able to assist you with questions regarding bills from these companies.  You will be contacted with the lab results as soon as they are available. The fastest way to get your results is to activate your My Chart account. Instructions are located on the last page of this paperwork. If you have not heard from [place] regarding the results in 2 weeks, please contact this office.

## 2017-01-12 NOTE — Progress Notes (Addendum)
Subjective:  By signing my name below, I, Renee Li, attest that this documentation has been prepared under the direction and in the presence of [name], MD Electronically Signed: Ladene Artist, ED Scribe 01/12/2017 at 9:04 AM.   Patient ID: Renee Li, female    DOB: 1970-05-21, 46 y.o.   MRN: [id number]  Chief Complaint  Patient presents with  . Follow-up    neck/shoulder pain hasn't improved, needs forms filled out  . Medication Refill    flexiril   HPI Renee Li is a 46 y.o. female who presents to Primary Care at [facility] for worker's comp f/u for injury that occurred at work. Last seen June 16. Date of injury March 29. Initial eval April 3. See details last visit. 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 She fell and hit her head. Diagnosed with concussion and some possible post-concussive syndrome. Also with neck pain after that incident. Cardiology eval: no apparent cause for syncope. Had persistent neck pain 50% recovered at June 16th visit. Still having pain radiating from upper R back to shoulder and elbow. Had been referred to neurology previously, had not seen at June 16th visit. No known h/o seizures, and denies recent syncope or near syncope at that June visit. Continued on driving restrictions. Neurology eval for syncope was pending. With continued pain on R side, she was referred to ortho, continued on Flexeril and discussed max dosing of Tylenol. Also continued on restrictions.   We discussed the mechanism of her fall last visit. She was wearing a knee brace from previous knee injury, got out of the truck, walked to scan a location bar code then neck memory was waking up in an ambulance. She did report that it was hot in the building where she had been prior to driving her truck but felt fine once driving her truck. She did report feeling somewhat hungry since she hadn't eaten that day. States she usually has a meal  replacement shake in the mornings but did not have one that morning as she had ran out of shakes but she did have a cup of coffee. States she planned to meet a co-worker at [employer] for lunch along the route prior to syncopal episode. No known true seizure activity. Denies tongue injury, defecation, witnessed seizure activity, dizziness or light-headedness prior to fall.   Today, pt states that she is still experiencing right shoulder pain that she describes as pins and needles sensation that radiates into her her right arm and into her right hand. She reports increased pain with lifting, opening doors, pushing doors and weakness in the right upper extremity as well. Pt is still taking 2 tablets of Tylenol twice daily which she states makes the pain tolerable but does not cure the pain. Also reports HAs when coming off of the Tylenol which have since resolved. Pt took Flexeril at night with relief but states she ran out several weeks ago. She has also tried a heat pack with some relief. Denies light-headedness, dizziness, h/o liver issues.   Syncope  Pt has not seen neurology yet.   Neck Pain Seen by Dr. Erlinda Hong on July 3. Notes reviewed. Reportedly 2 rounds of prednisone w/o improvement. Failed conservative therapy. MRI cervical spine ordered. Planned for f/u after MRI. Appears that MRI is still pending; states her claim has been denied.   Pt is still experiencing some neck stiffness with rotating her neck.   Paperwork She presents with a  letter from the [employer] requesting info on possible idiopathic fall.   Allergies  Allergen Reactions  . Aleve [Naproxen] Hives  . Ibuprofen Hives   Review of Systems  Musculoskeletal: Positive for arthralgias, neck pain and neck stiffness.  Neurological: Positive for weakness. Negative for dizziness and light-headedness.      Objective:   Physical Exam  Constitutional: She is oriented to person, place, and time. She appears  well-developed and well-nourished. No distress.  HENT:  Head: Normocephalic and atraumatic.  Eyes: Conjunctivae and EOM are normal.  Neck: Neck supple. No tracheal deviation present.  Cardiovascular: Normal rate.   Pulmonary/Chest: Effort normal. No respiratory distress.  Musculoskeletal: Normal range of motion.  R shoulder: full ROM. Full rotator cuff strength. Negative empty can. Decreased extension. Lacking ~15 degrees of R rotation. Lacks 20-30 deg of L rotation. Very minimal R/L lateral rotation with some discomfort. Equal grip strength. Slight discomfort, midline, primarily paraspinals.  Neurological: She is alert and oriented to person, place, and time.  Reflex Scores:      Tricep reflexes are 2+ on the right side and 2+ on the left side.      Bicep reflexes are 1+ on the right side and 2+ on the left side.      Brachioradialis reflexes are 2+ on the right side and 2+ on the left side. Skin: Skin is warm and dry.  Psychiatric: She has a normal mood and affect. Her behavior is normal.  Nursing note and vitals reviewed.   Vitals:   01/12/17 0840  BP: (!) 128/92  Pulse: 90  Resp: 16  Temp: 99.2 F (37.3 C)  SpO2: 98%  Weight: 171 lb 6.4 oz (77.7 kg)  Height: 5' 3.75" (1.619 m)   Spent over 25 minutes of care, greater than 50% counseling.     Assessment & Plan:   [name] is a 46 y.o. female Neck pain on right side - Plan: cyclobenzaprine (FLEXERIL) 5 MG tablet Muscle spasm - Plan: cyclobenzaprine (FLEXERIL) 5 MG tablet  - Persistent right sided neck pain, spasm due to fall at work as above with date of injury March 29. She was evaluated by orthopedics with plan for MRI, but that has not been obtained yet at this time. Does report some symptomatic improvement with Flexeril without side effects of dizziness/sedation.  -Continue Tylenol, Flexeril refilled, potential side effects discussed. Advised not to take Flexeril if any sedation the following day, and to not  drive if at all sedated.   -Due to some improvement with ability to use arm to drive and reports ability to do some lifting, lessened restrictions to allow her to drive, but still with some restrictions in place for overuse/repetitive use. Advised to return to clinic in 2 weeks, sooner if any difficulty with these restrictions.  -Still recommended orthopedic follow-up, but not determined at this time whether that will be covered through [facility] or independently.  Syncope and collapse  -As above, syncope at work without known specific cause. She was attended to by EMS, but does not remember being told she was hypoglycemic or any other specific cause, no known seizure activity.  She did report feeling somewhat hungry since she hadn't eaten that day yet, but again no known documented hypoglycemia, and does not remember any near syncopal symptoms prior to incident.  -She has not had any recurrence of the same symptoms, do not anticipate need for further workup at this time unless near syncope symptoms or  lightheadedness/dizziness.  -Letter requested for documentation of syncopal event, will complete and have ready for patient.  Need for prophylactic vaccination and inoculation against influenza - Plan: Flu Vaccine QUAD 6+ mos PF IM (Fluarix Quad PF)   Meds ordered this encounter  Medications  . cyclobenzaprine (FLEXERIL) 5 MG tablet    Sig: 1 pill by mouth up to every 8 hours as needed. Start with one pill by mouth each bedtime as needed due to sedation    Dispense:  15 tablet    Refill:  0   Patient Instructions   Continue  Tylenol with over-the-counter as needed for pain in neck or arm, heat or ice to the back of the neck as needed. Flexeril at bedtime if needed, but do not use that medication if it is causing sleepiness the next day. Do not drive if you are sleepy. As you have not had any return of lightheadedness, dizziness, or feelings of passing out, I will returning to driving. Recheck  with me in 2 weeks.  I will completed a letter as requested, that should be ready later this week.  You will likely still need to follow-up with orthopedics, but we can determine further plans at next visit. Let me know if you have questions or concerns in the meantime, and follow-up sooner if you have any difficulty with restrictions provided today.    IF you received an x-ray today, you will receive an invoice from [facility]. Please contact [facility] at [phone] with questions or concerns regarding your invoice.   IF you received labwork today, you will receive an invoice from [facility]. Please contact [facility] at [phone] with questions or concerns regarding your invoice.   Our billing staff will not be able to assist you with questions regarding bills from these companies.  You will be contacted with the lab results as soon as they are available. The fastest way to get your results is to activate your My Chart account. Instructions are located on the last page of this paperwork. If you have not heard from [place] regarding the results in 2 weeks, please contact this office.       I personally performed the services described in this documentation, which was scribed in my presence. The recorded information has been reviewed and considered for accuracy and completeness, addended by me as needed, and agree with information above.  Signed,   Merri Ray, MD [facility] at [place].  01/14/17 2:22 PM

## 2017-01-13 ENCOUNTER — Telehealth: Payer: Self-pay | Admitting: Family Medicine

## 2017-01-13 NOTE — Telephone Encounter (Signed)
Pt needs a form filled out for her job concerning [place] paperwork. I have put it  in the nurses station at 102. Please CB pt when ready at [phone]

## 2017-01-14 NOTE — Telephone Encounter (Signed)
Pt called the ofc, regarding a form that needs to be filled out for WC.

## 2017-01-15 NOTE — Telephone Encounter (Signed)
[place] [employer] form given to Tasha/Dr. Vonna Kotyk asst for completion

## 2017-01-16 ENCOUNTER — Telehealth: Payer: Self-pay

## 2017-01-16 NOTE — Telephone Encounter (Signed)
Addressed.

## 2017-01-16 NOTE — Telephone Encounter (Signed)
Spoke with patient and requested that she bring in original paperwork for claim. Pt stated that she would be bringing this into the office so that Dr Carlota Raspberry can finish the paperwork.

## 2017-01-16 NOTE — Telephone Encounter (Signed)
I completed this form for her at last visit. My understanding is that she needs form completed again as she transferred to another station. Can we obtain the form that was turned in previously and provide a copy for her - I do not have it, but it should have been copied or scanned at last visit. She should also be able to obtain previously completed paperwork from supervisor or have that sent to her new station.

## 2017-01-19 ENCOUNTER — Telehealth: Payer: Self-pay

## 2017-01-19 NOTE — Telephone Encounter (Signed)
Patient called in at 4:50pm stating she would be here at [facility] on 01/20/17 to get her form from [place] and that if it was not going to be ready by then she expected someone to call her and tell her. And then she hung up.

## 2017-01-19 NOTE — Telephone Encounter (Signed)
New duty status report form was provided for me to complete at end of last week. Requested prior duty status form form that was completed on October 1st for review of prior restrictions to compare to new report. Initially unable to obtain that form, but staff did find that form for me to review this afternoon. New duty status report completed today, ready for pickup or fax if needed.  Please make sure we keep scanned copies of both.

## 2017-01-19 NOTE — Telephone Encounter (Signed)
Pt came into the office today upset that workers comp form was not filled out. There was an original form already completed but we do not have a copy of this. Requested that patient bring in original last week. Pt states that she was unable to obtain from from her work and that we are liable for not having original form.  Advised pt that we would follow up today.

## 2017-01-19 NOTE — Telephone Encounter (Signed)
Sending this to Jenny Reichmann and clinical pool

## 2017-01-20 NOTE — Telephone Encounter (Signed)
Addressed.

## 2017-01-29 ENCOUNTER — Ambulatory Visit: Payer: Self-pay | Admitting: Family Medicine

## 2017-05-14 DIAGNOSIS — Z0000 Encounter for general adult medical examination without abnormal findings: Secondary | ICD-10-CM | POA: Diagnosis not present

## 2017-05-14 DIAGNOSIS — I10 Essential (primary) hypertension: Secondary | ICD-10-CM | POA: Diagnosis not present

## 2017-09-03 ENCOUNTER — Encounter: Payer: Self-pay | Admitting: Family Medicine

## 2017-11-11 DIAGNOSIS — H9203 Otalgia, bilateral: Secondary | ICD-10-CM | POA: Diagnosis not present

## 2017-11-11 DIAGNOSIS — I10 Essential (primary) hypertension: Secondary | ICD-10-CM | POA: Diagnosis not present

## 2018-01-06 IMAGING — MG DIGITAL SCREENING BILATERAL MAMMOGRAM WITH CAD
4 series · 4 of 4 positions shown · non-contrast
Comparison: Previous exam(s).

CLINICAL DATA: Screening.

EXAM:
DIGITAL SCREENING BILATERAL MAMMOGRAM WITH CAD

[L MLO]
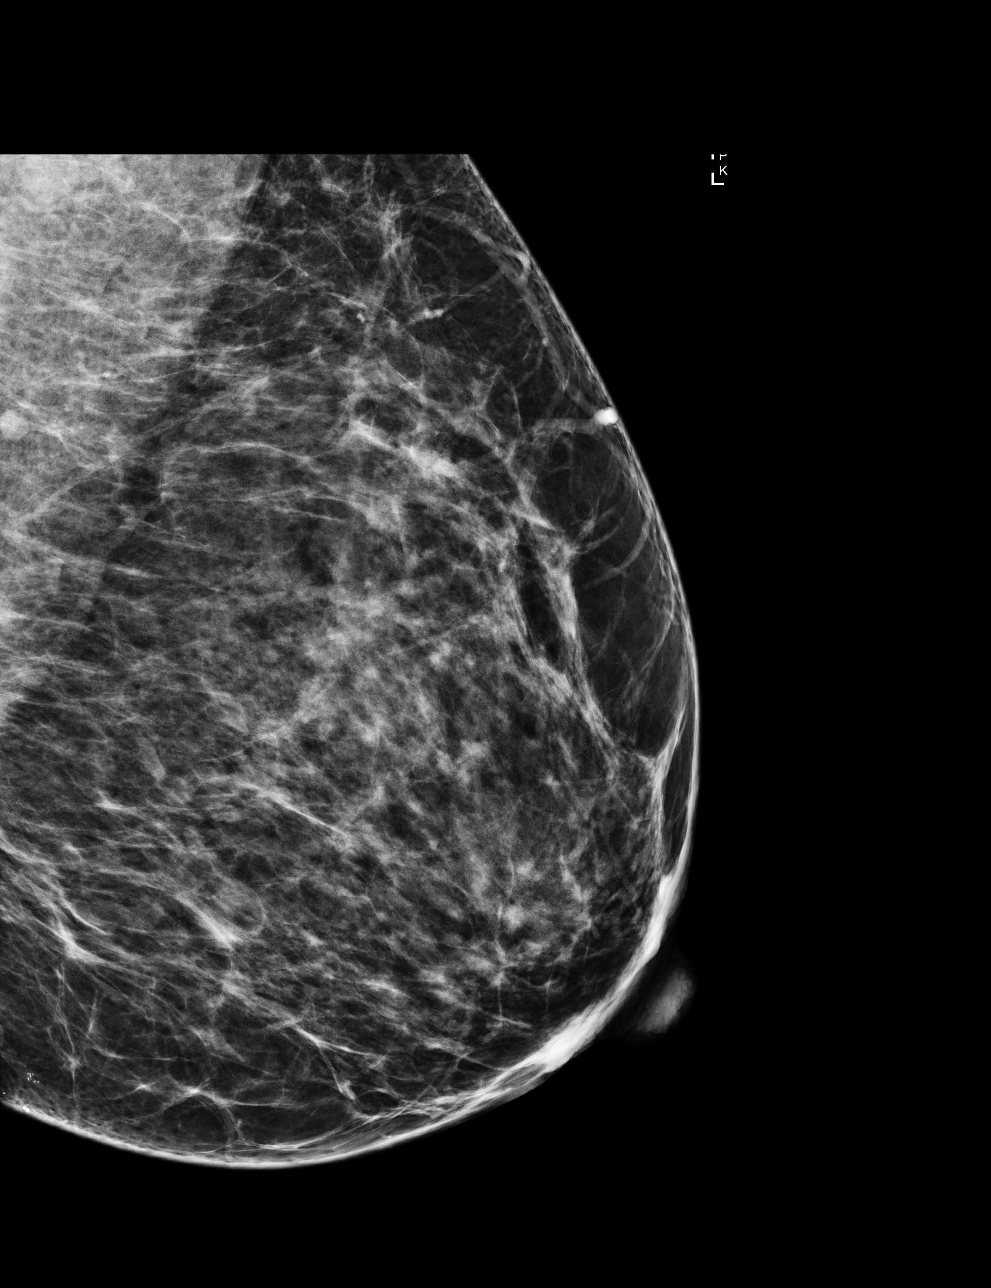

[R MLO]
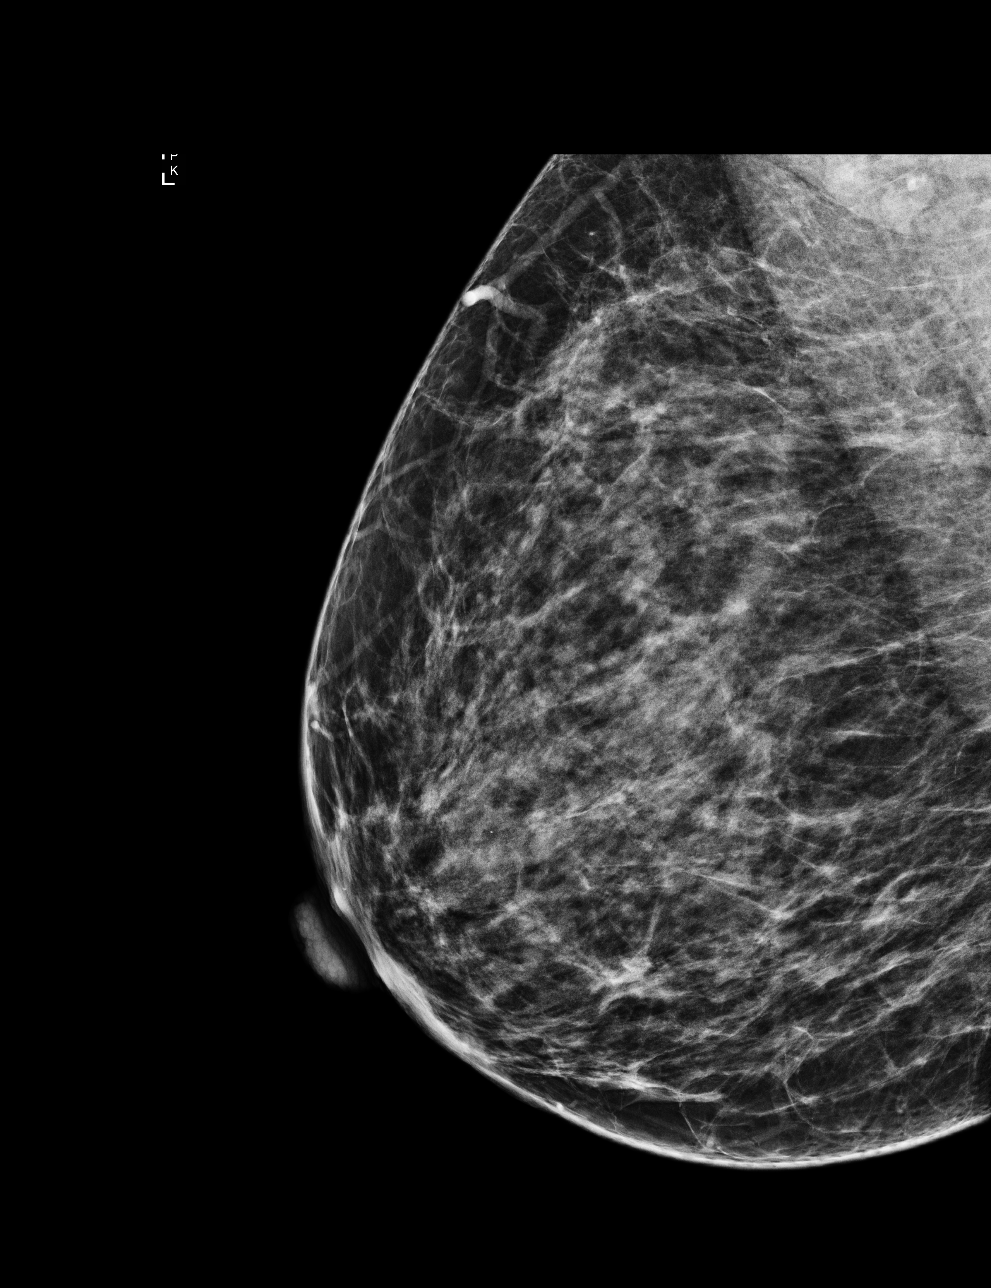

[L CC]
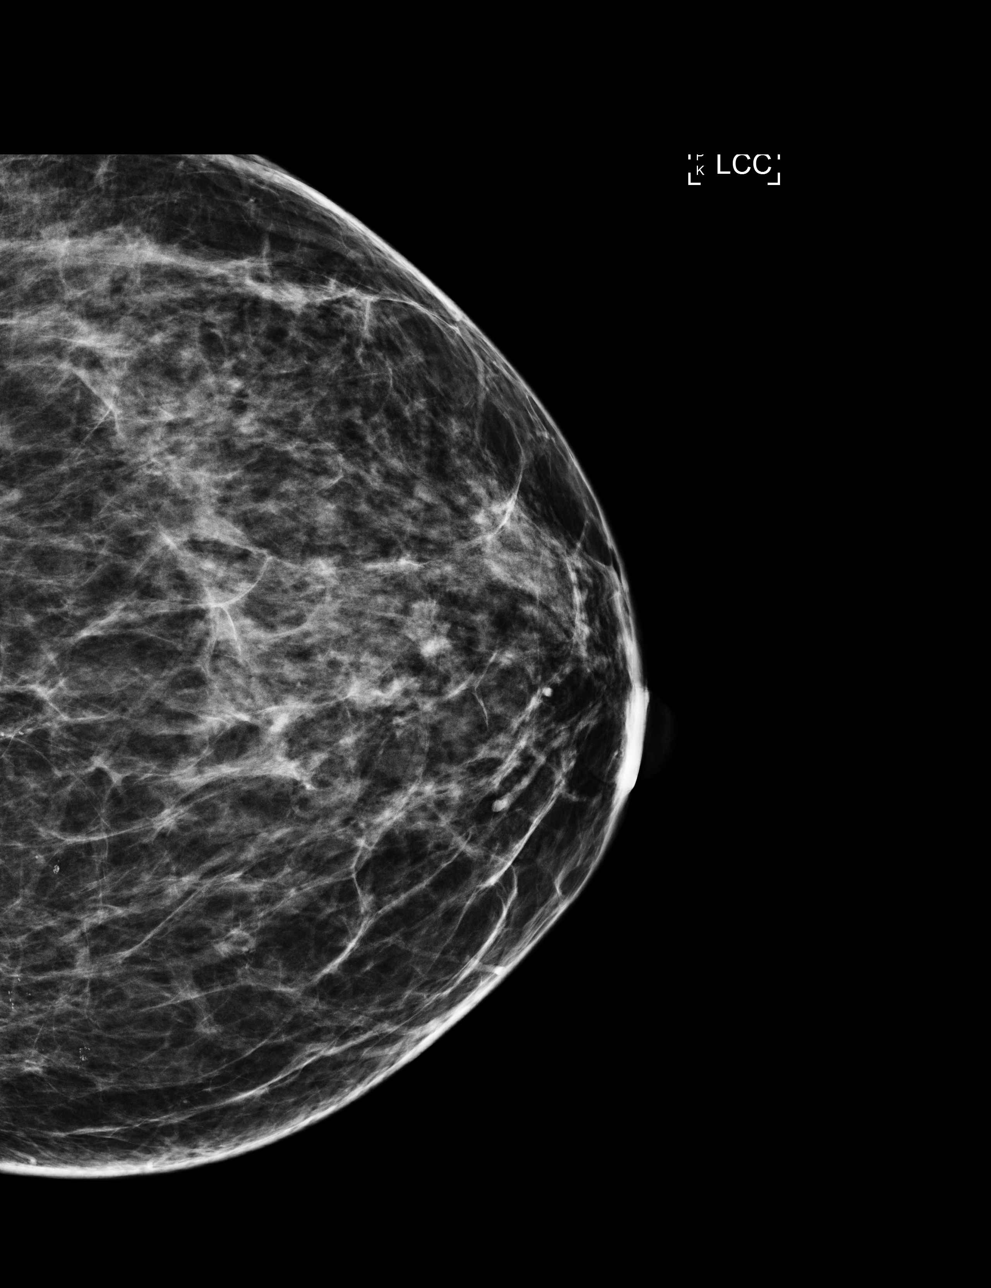

[R CC]
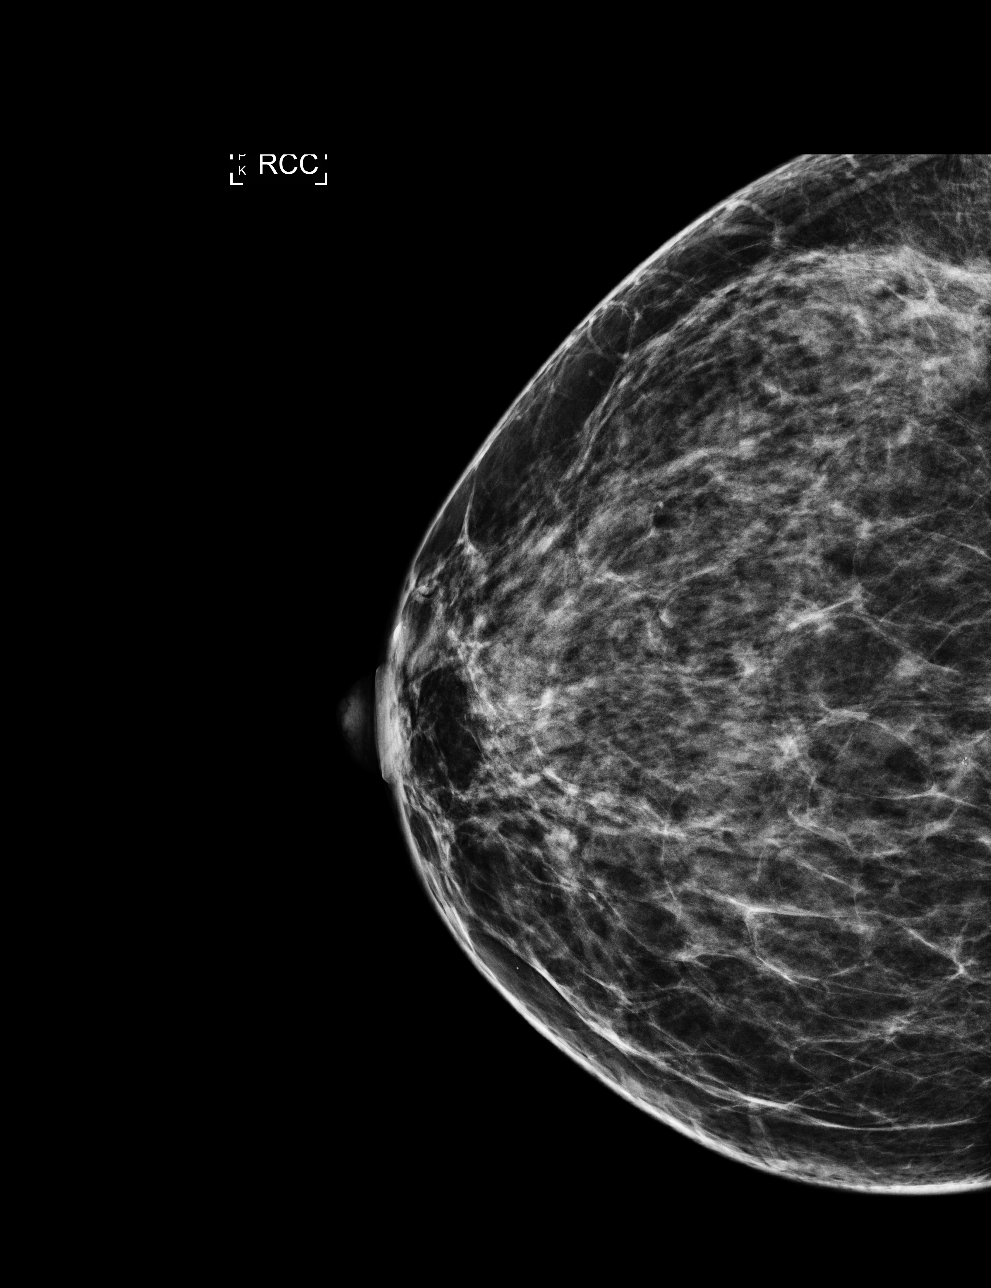

[4 of 4 positions shown; findings below may reference images not displayed]

ACR Breast Density Category c: The breast tissue is heterogeneously
dense, which may obscure small masses.
FINDINGS: There are no findings suspicious for malignancy. Images were
processed with CAD.
IMPRESSION: No mammographic evidence of malignancy. A result letter of this
screening mammogram will be mailed directly to the patient.

RECOMMENDATION:
Screening mammogram in one year. (Code:YJ-[id number])

BI-RADS CATEGORY  1: Negative.

## 2018-02-09 DIAGNOSIS — Z1231 Encounter for screening mammogram for malignant neoplasm of breast: Secondary | ICD-10-CM | POA: Diagnosis not present

## 2018-02-09 LAB — HM MAMMOGRAPHY: HM MAMMO: NORMAL (ref 0–4)

## 2018-02-19 ENCOUNTER — Encounter: Payer: Self-pay | Admitting: Nurse Practitioner

## 2018-05-14 ENCOUNTER — Telehealth: Payer: Self-pay | Admitting: Nurse Practitioner

## 2018-05-14 NOTE — Telephone Encounter (Signed)
ATT TO CONTACT PT TO RESCHEDULE APPT ON 02/6 WHERE PROVIDER OUT OF OFFICE, NO ANS AT EITHER NUMBER UNABLE TO LVM

## 2018-05-20 ENCOUNTER — Encounter: Payer: Self-pay | Admitting: Nurse Practitioner

## 2018-05-28 ENCOUNTER — Encounter: Payer: Self-pay | Admitting: Nurse Practitioner

## 2018-05-28 ENCOUNTER — Other Ambulatory Visit: Payer: Self-pay

## 2018-05-28 ENCOUNTER — Ambulatory Visit (INDEPENDENT_AMBULATORY_CARE_PROVIDER_SITE_OTHER): Payer: 59 | Admitting: Nurse Practitioner

## 2018-05-28 VITALS — BP 128/72 | HR 66 | Temp 98.5°F | Ht 64.2 in | Wt 176.2 lb

## 2018-05-28 DIAGNOSIS — I10 Essential (primary) hypertension: Secondary | ICD-10-CM | POA: Diagnosis not present

## 2018-05-28 DIAGNOSIS — Z113 Encounter for screening for infections with a predominantly sexual mode of transmission: Secondary | ICD-10-CM

## 2018-05-28 DIAGNOSIS — Z30433 Encounter for removal and reinsertion of intrauterine contraceptive device: Secondary | ICD-10-CM

## 2018-05-28 DIAGNOSIS — Z1211 Encounter for screening for malignant neoplasm of colon: Secondary | ICD-10-CM | POA: Diagnosis not present

## 2018-05-28 DIAGNOSIS — E6609 Other obesity due to excess calories: Secondary | ICD-10-CM

## 2018-05-28 DIAGNOSIS — Z0000 Encounter for general adult medical examination without abnormal findings: Secondary | ICD-10-CM | POA: Diagnosis not present

## 2018-05-28 DIAGNOSIS — Z6830 Body mass index (BMI) 30.0-30.9, adult: Secondary | ICD-10-CM

## 2018-05-28 LAB — POCT URINALYSIS DIPSTICK
Bilirubin, UA: NEGATIVE
Blood, UA: NEGATIVE
Glucose, UA: NEGATIVE
Ketones, UA: NEGATIVE
LEUKOCYTES UA: NEGATIVE
Nitrite, UA: NEGATIVE
Protein, UA: NEGATIVE
Spec Grav, UA: 1.02 (ref 1.010–1.025)
Urobilinogen, UA: 0.2 E.U./dL
pH, UA: 5.5 (ref 5.0–8.0)

## 2018-05-28 LAB — POCT UA - MICROALBUMIN
Albumin/Creatinine Ratio, Urine, POC: <30
Creatinine, POC: 200 mg/dL
Microalbumin Ur, POC: 30 mg/L

## 2018-05-28 NOTE — Progress Notes (Addendum)
Subjective:     Patient ID: Renee Li , female    DOB: December 14, 1970 , 48 y.o.   MRN: [id number]   Chief Complaint  Patient presents with  . Annual Exam    The patient states she uses IUD for birth control. Last LMP was No LMP recorded. (Menstrual status: IUD).. Negative for Dysmenorrhea and Negative for Menorrhagia Mammogram last done 02/09/2018.  Negative for: breast discharge, breast lump(s), breast pain and breast self exam.  Pertinent negatives include abnormal bleeding (hematology), anxiety, decreased libido, depression, difficulty falling sleep, dyspareunia, history of infertility, nocturia, sexual dysfunction, sleep disturbances, urinary incontinence, urinary urgency, vaginal discharge and vaginal itching. Diet regular and she has purchased an air fryer, intermittent fasting 10pm - 10am. She is eating light dinner's. The patient states her exercise level is   3 days per week 30 minutes per day.     The patient's tobacco use is:   Social History   Tobacco Use  Smoking Status Never Smoker  Smokeless Tobacco Never Used   She has been exposed to passive smoke. The patient's alcohol use is:  Social History   Substance and Sexual Activity  Alcohol Use Yes   Comment: socially  Additional information: Last pap 2018, next one scheduled for 2021, done here at the office   HPI  Here for HM  Hypertension  This is a chronic problem. The current episode started more than 1 year ago. The problem is unchanged. The problem is controlled. Pertinent negatives include no anxiety or blurred vision. There are no associated agents to hypertension. Risk factors for coronary artery disease include sedentary lifestyle. Past treatments include nothing.     Past Medical History:  Diagnosis Date  . Depression   . Heart murmur   . Hypertension      Family History  Problem Relation Age of Onset  . Cancer Mother        throat  . Stroke Maternal Grandmother   . Diabetes Maternal  Grandmother   . Cancer Maternal Grandfather        prostate  . Heart disease Paternal Grandmother      Current Outpatient Medications:  .  aspirin-acetaminophen-caffeine (EXCEDRIN MIGRAINE) 250-250-65 MG per tablet, Take 1 tablet by mouth every 6 (six) hours as needed for headache., Disp: , Rfl:  .  atenolol (TENORMIN) 50 MG tablet, Take 1 tablet (50 mg total) by mouth daily., Disp: 30 tablet, Rfl: 0 .  buPROPion (WELLBUTRIN SR) 150 MG 12 hr tablet, Take 1 tablet (150 mg total) by mouth 2 (two) times daily., Disp: 60 tablet, Rfl: 1 .  cyclobenzaprine (FLEXERIL) 5 MG tablet, 1 pill by mouth up to every 8 hours as needed. Start with one pill by mouth each bedtime as needed due to sedation, Disp: 15 tablet, Rfl: 0 .  hydrochlorothiazide (HYDRODIURIL) 25 MG tablet, Take 1 tablet (25 mg total) by mouth daily., Disp: 30 tablet, Rfl: 0 .  traZODone (DESYREL) 50 MG tablet, Take 1 tablet (50 mg total) by mouth at bedtime as needed and may repeat dose one time if needed for sleep. (Patient not taking: Reported on 05/28/2018), Disp: 30 tablet, Rfl: 0   Allergies  Allergen Reactions  . Aleve [Naproxen] Hives  . Ibuprofen Hives     Review of Systems  Constitutional: Negative.   HENT: Negative.   Eyes: Negative.  Negative for blurred vision.  Respiratory: Negative.   Cardiovascular: Negative.   Gastrointestinal: Negative.   Endocrine: Negative.   Genitourinary: Negative.  Musculoskeletal: Negative.   Skin: Negative.   Allergic/Immunologic: Negative.   Neurological: Negative.   Hematological: Negative.   Psychiatric/Behavioral: Negative.      Today's Vitals   05/28/18 0923  BP: 128/72  Pulse: 66  Temp: 98.5 F (36.9 C)  TempSrc: Oral  SpO2: 96%  Weight: 176 lb 3.2 oz (79.9 kg)  Height: 5' 4.2" (1.631 m)   Body mass index is 30.06 kg/m.   Objective:  Physical Exam Vitals signs reviewed.  Constitutional:      Appearance: Normal appearance. She is well-developed.  HENT:      Head: Normocephalic and atraumatic.     Right Ear: Hearing, tympanic membrane, ear canal and external ear normal.     Left Ear: Hearing, tympanic membrane, ear canal and external ear normal.     Nose: Nose normal.     Mouth/Throat:     Mouth: Mucous membranes are moist.  Eyes:     General: Lids are normal.     Conjunctiva/sclera: Conjunctivae normal.     Pupils: Pupils are equal, round, and reactive to light.     Funduscopic exam:    Right eye: No papilledema.        Left eye: No papilledema.  Neck:     Musculoskeletal: Full passive range of motion without pain, normal range of motion and neck supple.     Thyroid: No thyroid mass.     Vascular: No carotid bruit.  Cardiovascular:     Rate and Rhythm: Regular rhythm. Bradycardia present.     Pulses: Normal pulses.     Heart sounds: Normal heart sounds. No murmur.  Pulmonary:     Effort: Pulmonary effort is normal.     Breath sounds: Normal breath sounds.  Abdominal:     General: Abdomen is flat. Bowel sounds are normal.     Palpations: Abdomen is soft.  Musculoskeletal: Normal range of motion.        General: No swelling.     Right lower leg: No edema.     Left lower leg: No edema.  Skin:    General: Skin is warm and dry.     Capillary Refill: Capillary refill takes less than 2 seconds.  Neurological:     General: No focal deficit present.     Mental Status: She is alert and oriented to person, place, and time.     Cranial Nerves: No cranial nerve deficit.     Sensory: No sensory deficit.  Psychiatric:        Mood and Affect: Mood normal.        Behavior: Behavior normal.        Thought Content: Thought content normal.        Judgment: Judgment normal.         Assessment And Plan:     1. Health maintenance examination Behavior modifications discussed and diet history reviewed.   Pt will continue to exercise regularly and modify diet with low GI, plant based foods and decrease intake of processed foods.  Recommend  intake of daily multivitamin, Vitamin D, and calcium.  Recommend mammogram (up to date) and colonoscopy for preventive screenings, as well as recommend immunizations that include influenza, TDAP (up to date) - Referral to Nutrition and Diabetes Services - CBC no Diff - Hemoglobin A1c - Lipid panel - CMP14 + Anion Gap - TSH  2. Essential hypertension B/P is controlled.  CMP ordered to check renal function.  The importance of regular exercise and dietary modification  was stressed to the patient.  Stressed importance of losing ten percent of her body weight to help with B/P control.  The weight loss would help with decreasing cardiac and cancer risk as well.   ECG done with Sinus Rhythm with bradycardia - Referral to Nutrition and Diabetes Services - EKG 12-Lead - CBC no Diff - Hemoglobin A1c - Lipid panel - CMP14 + Anion Gap - TSH  3. Class 1 obesity due to excess calories without serious comorbidity with body mass index (BMI) of 30.0 to 30.9 in adult  Chronic ASK- Given permission to discuss weight today ASSESS - Body mass index is 30.06 kg/m., WAIST CIRCUMFERENCE 35.5 in  ADVISE  - on risk of cardiovascular disease currently has a diagnosis of hypertension, on medications.  Discussed in detail the importance of staying consistent with the weight loss journey AGREE - to increase her physical activity of walking back to walking more frequently, Also encouraged to keep a food log and bring when she returns   ASSIST - Given handout for http://[email or web address].wilson-mendoza.org/ 10 tips, CDC exercise for adults and CDC Eat More Weight Less, I have also given her a card for a personal trainer and encouraged to seek other avenues of exercising. Barriers include not sleeping more than 6 hours per nightEncouraged to exercise at least 150 minutes per week with 2 days of strength training - Referral to Nutrition and Diabetes Services  4. Encounter for screening colonoscopy  According to USPTF Colorectal cancer  Screening guidelines. Colonoscopy is recommended every 10 years, starting at age 62years.  Will refer to GI for colon cancer screening. - Ambulatory referral to Gastroenterology  5. Encounter for removal and reinsertion of intrauterine contraceptive device (IUD)  I have referred her to GYN for change her Mirena out - Ambulatory referral to Gynecology  6. Screening examination for STD (sexually transmitted disease)  - Urine cytology ancillary only - HIV antibody (with reflex) - T pallidum Screening [facility]   [name], FNP

## 2018-05-28 NOTE — Patient Instructions (Addendum)
Health Maintenance, Female Adopting a healthy lifestyle and getting preventive care can go a long way to promote health and wellness. Talk with your health care provider about what schedule of regular examinations is right for you. This is a good chance for you to check in with your provider about disease prevention and staying healthy. In between checkups, there are plenty of things you can do on your own. Experts have done a lot of research about which lifestyle changes and preventive measures are most likely to keep you healthy. Ask your health care provider for more information. Weight and diet Eat a healthy diet  Be sure to include plenty of vegetables, fruits, low-fat dairy products, and lean protein.  Do not eat a lot of foods high in solid fats, added sugars, or salt.  Get regular exercise. This is one of the most important things you can do for your health. ? Most adults should exercise for at least 150 minutes each week. The exercise should increase your heart rate and make you sweat (moderate-intensity exercise). ? Most adults should also do strengthening exercises at least twice a week. This is in addition to the moderate-intensity exercise. Maintain a healthy weight  Body mass index (BMI) is a measurement that can be used to identify possible weight problems. It estimates body fat based on height and weight. Your health care provider can help determine your BMI and help you achieve or maintain a healthy weight.  For females 20 years of age and older: ? A BMI below 18.5 is considered underweight. ? A BMI of 18.5 to 24.9 is normal. ? A BMI of 25 to 29.9 is considered overweight. ? A BMI of 30 and above is considered obese. Watch levels of cholesterol and blood lipids  You should start having your blood tested for lipids and cholesterol at 50 years of age, then have this test every 5 years.  You may need to have your cholesterol levels checked more often if: ? Your lipid or  cholesterol levels are high. ? You are older than 50 years of age. ? You are at high risk for heart disease. Cancer screening Lung Cancer  Lung cancer screening is recommended for adults 55-80 years old who are at high risk for lung cancer because of a history of smoking.  A yearly low-dose CT scan of the lungs is recommended for people who: ? Currently smoke. ? Have quit within the past 15 years. ? Have at least a 30-pack-year history of smoking. A pack year is smoking an average of one pack of cigarettes a day for 1 year.  Yearly screening should continue until it has been 15 years since you quit.  Yearly screening should stop if you develop a health problem that would prevent you from having lung cancer treatment. Breast Cancer  Practice breast self-awareness. This means understanding how your breasts normally appear and feel.  It also means doing regular breast self-exams. Let your health care provider know about any changes, no matter how small.  If you are in your 20s or 30s, you should have a clinical breast exam (CBE) by a health care provider every 1-3 years as part of a regular health exam.  If you are 40 or older, have a CBE every year. Also consider having a breast X-ray (mammogram) every year.  If you have a family history of breast cancer, talk to your health care provider about genetic screening.  If you are at high risk for breast cancer, talk   to your health care provider about having an MRI and a mammogram every year.  Breast cancer gene (BRCA) assessment is recommended for women who have family members with BRCA-related cancers. BRCA-related cancers include: ? Breast. ? Ovarian. ? Tubal. ? Peritoneal cancers.  Results of the assessment will determine the need for genetic counseling and BRCA1 and BRCA2 testing. Cervical Cancer Your health care provider may recommend that you be screened regularly for cancer of the pelvic organs (ovaries, uterus, and vagina).  This screening involves a pelvic examination, including checking for microscopic changes to the surface of your cervix (Pap test). You may be encouraged to have this screening done every 3 years, beginning at age 21.  For women ages 30-65, health care providers may recommend pelvic exams and Pap testing every 3 years, or they may recommend the Pap and pelvic exam, combined with testing for human papilloma virus (HPV), every 5 years. Some types of HPV increase your risk of cervical cancer. Testing for HPV may also be done on women of any age with unclear Pap test results.  Other health care providers may not recommend any screening for nonpregnant women who are considered low risk for pelvic cancer and who do not have symptoms. Ask your health care provider if a screening pelvic exam is right for you.  If you have had past treatment for cervical cancer or a condition that could lead to cancer, you need Pap tests and screening for cancer for at least 20 years after your treatment. If Pap tests have been discontinued, your risk factors (such as having a new sexual partner) need to be reassessed to determine if screening should resume. Some women have medical problems that increase the chance of getting cervical cancer. In these cases, your health care provider may recommend more frequent screening and Pap tests. Colorectal Cancer  This type of cancer can be detected and often prevented.  Routine colorectal cancer screening usually begins at 50 years of age and continues through 50 years of age.  Your health care provider may recommend screening at an earlier age if you have risk factors for colon cancer.  Your health care provider may also recommend using home test kits to check for hidden blood in the stool.  A small camera at the end of a tube can be used to examine your colon directly (sigmoidoscopy or colonoscopy). This is done to check for the earliest forms of colorectal cancer.  Routine  screening usually begins at age 50.  Direct examination of the colon should be repeated every 5-10 years through 50 years of age. However, you may need to be screened more often if early forms of precancerous polyps or small growths are found. Skin Cancer  Check your skin from head to toe regularly.  Tell your health care provider about any new moles or changes in moles, especially if there is a change in a mole's shape or color.  Also tell your health care provider if you have a mole that is larger than the size of a pencil eraser.  Always use sunscreen. Apply sunscreen liberally and repeatedly throughout the day.  Protect yourself by wearing long sleeves, pants, a wide-brimmed hat, and sunglasses whenever you are outside. Heart disease, diabetes, and high blood pressure  High blood pressure causes heart disease and increases the risk of stroke. High blood pressure is more likely to develop in: ? People who have blood pressure in the high end of the normal range (130-139/85-89 mm Hg). ? People   who are overweight or obese. ? People who are African American.  If you are 84-22 years of age, have your blood pressure checked every 3-5 years. If you are 67 years of age or older, have your blood pressure checked every year. You should have your blood pressure measured twice-once when you are at a hospital or clinic, and once when you are not at a hospital or clinic. Record the average of the two measurements. To check your blood pressure when you are not at a hospital or clinic, you can use: ? An automated blood pressure machine at a pharmacy. ? A home blood pressure monitor.  If you are between 52 years and 3 years old, ask your health care provider if you should take aspirin to prevent strokes.  Have regular diabetes screenings. This involves taking a blood sample to check your fasting blood sugar level. ? If you are at a normal weight and have a low risk for diabetes, have this test once  every three years after 50 years of age. ? If you are overweight and have a high risk for diabetes, consider being tested at a younger age or more often. Preventing infection Hepatitis B  If you have a higher risk for hepatitis B, you should be screened for this virus. You are considered at high risk for hepatitis B if: ? You were born in a country where hepatitis B is common. Ask your health care provider which countries are considered high risk. ? Your parents were born in a high-risk country, and you have not been immunized against hepatitis B (hepatitis B vaccine). ? You have HIV or AIDS. ? You use needles to inject street drugs. ? You live with someone who has hepatitis B. ? You have had sex with someone who has hepatitis B. ? You get hemodialysis treatment. ? You take certain medicines for conditions, including cancer, organ transplantation, and autoimmune conditions. Hepatitis C  Blood testing is recommended for: ? Everyone born from [birth year] through 1967. ? Anyone with known risk factors for hepatitis C. Sexually transmitted infections (STIs)  You should be screened for sexually transmitted infections (STIs) including gonorrhea and chlamydia if: ? You are sexually active and are younger than 49 years of age. ? You are older than 50 years of age and your health care provider tells you that you are at risk for this type of infection. ? Your sexual activity has changed since you were last screened and you are at an increased risk for chlamydia or gonorrhea. Ask your health care provider if you are at risk.  If you do not have HIV, but are at risk, it may be recommended that you take a prescription medicine daily to prevent HIV infection. This is called pre-exposure prophylaxis (PrEP). You are considered at risk if: ? You are sexually active and do not regularly use condoms or know the HIV status of your partner(s). ? You take drugs by injection. ? You are sexually active with a partner  who has HIV. Talk with your health care provider about whether you are at high risk of being infected with HIV. If you choose to begin PrEP, you should first be tested for HIV. You should then be tested every 3 months for as long as you are taking PrEP. Pregnancy  If you are premenopausal and you may become pregnant, ask your health care provider about preconception counseling.  If you may become pregnant, take 400 to 800 micrograms (mcg) of folic acid every  day.  If you want to prevent pregnancy, talk to your health care provider about birth control (contraception). Osteoporosis and menopause  Osteoporosis is a disease in which the bones lose minerals and strength with aging. This can result in serious bone fractures. Your risk for osteoporosis can be identified using a bone density scan.  If you are 78 years of age or older, or if you are at risk for osteoporosis and fractures, ask your health care provider if you should be screened.  Ask your health care provider whether you should take a calcium or vitamin D supplement to lower your risk for osteoporosis.  Menopause may have certain physical symptoms and risks.  Hormone replacement therapy may reduce some of these symptoms and risks. Talk to your health care provider about whether hormone replacement therapy is right for you. Follow these instructions at home:  Schedule regular health, dental, and eye exams.  Stay current with your immunizations.  Do not use any tobacco products including cigarettes, chewing tobacco, or electronic cigarettes.  If you are pregnant, do not drink alcohol.  If you are breastfeeding, limit how much and how often you drink alcohol.  Limit alcohol intake to no more than 1 drink per day for nonpregnant women. One drink equals 12 ounces of beer, 5 ounces of wine, or 1 ounces of hard liquor.  Do not use street drugs.  Do not share needles.  Ask your health care provider for help if you need support  or information about quitting drugs.  Tell your health care provider if you often feel depressed.  Tell your health care provider if you have ever been abused or do not feel safe at home. This information is not intended to replace advice given to you by your health care provider. Make sure you discuss any questions you have with your health care provider. Document Released: 10/14/2010 Document Revised: 09/06/2015 Document Reviewed: 01/02/2015 Elsevier Interactive Patient Education  2019 [employer]   Preventing Unhealthy [employer], Adult Staying at a healthy weight is important to your overall health. When fat builds up in your body, you may become overweight or obese. Being overweight or obese increases your risk of developing certain health problems, such as heart disease, diabetes, sleeping problems, joint problems, and some types of cancer. Unhealthy weight gain is often the result of making unhealthy food choices or not getting enough exercise. You can make changes to your lifestyle to prevent obesity and stay as healthy as possible. What nutrition changes can be made?   Eat only as much as your body needs. To do this: ? Pay attention to signs that you are hungry or full. Stop eating as soon as you feel full. ? If you feel hungry, try drinking water first before eating. Drink enough water so your urine is clear or pale yellow. ? Eat smaller portions. Pay attention to portion sizes when eating out. ? Look at serving sizes on food labels. Most foods contain more than one serving per container. ? Eat the recommended number of calories for your gender and activity level. For most active people, a daily total of 2,000 calories is appropriate. If you are trying to lose weight or are not very active, you may need to eat fewer calories. Talk with your health care provider or a diet and nutrition specialist (dietitian) about how many calories you need each day.  Choose healthy foods, such  as: ? Fruits and vegetables. At each meal, try to fill at least half  of your plate with fruits and vegetables. ? Whole grains, such as whole-wheat bread, brown rice, and quinoa. ? Lean meats, such as chicken or fish. ? Other healthy proteins, such as beans, eggs, or tofu. ? Healthy fats, such as nuts, seeds, fatty fish, and olive oil. ? Low-fat or fat-free dairy products.  Check food labels, and avoid food and drinks that: ? Are high in calories. ? Have added sugar. ? Are high in sodium. ? Have saturated fats or trans fats.  Cook foods in healthier ways, such as by baking, broiling, or grilling.  Make a meal plan for the week, and shop with a grocery list to help you stay on track with your purchases. Try to avoid going to the grocery store when you are hungry.  When grocery shopping, try to shop around the outside of the store first, where the fresh foods are. Doing this helps you to avoid prepackaged foods, which can be high in sugar, salt (sodium), and fat. What lifestyle changes can be made?   Exercise for 30 or more minutes on 5 or more days each week. Exercising may include brisk walking, yard work, biking, running, swimming, and team sports like basketball and soccer. Ask your health care provider which exercises are safe for you.  Do muscle-strengthening activities, such as lifting weights or using resistance bands, on 2 or more days a week.  Do not use any products that contain nicotine or tobacco, such as cigarettes and e-cigarettes. If you need help quitting, ask your health care provider.  Limit alcohol intake to no more than 1 drink a day for nonpregnant women and 2 drinks a day for men. One drink equals 12 oz of beer, 5 oz of wine, or 1 oz of hard liquor.  Try to get 7-9 hours of sleep each night. What other changes can be made?  Keep a food and activity journal to keep track of: ? What you ate and how many calories you had. Remember to count the calories in sauces,  dressings, and side dishes. ? Whether you were active, and what exercises you did. ? Your calorie, weight, and activity goals.  Check your weight regularly. Track any changes. If you notice you have gained weight, make changes to your diet or activity routine.  Avoid taking weight-loss medicines or supplements. Talk to your health care provider before starting any new medicine or supplement.  Talk to your health care provider before trying any new diet or exercise plan. Why are these changes important? Eating healthy, staying active, and having healthy habits can help you to prevent obesity. Those changes also:  Help you manage stress and emotions.  Help you connect with friends and family.  Improve your self-esteem.  Improve your sleep.  Prevent long-term health problems. What can happen if changes are not made? Being obese or overweight can cause you to develop joint or bone problems, which can make it hard for you to stay active or do activities you enjoy. Being obese or overweight also puts stress on your heart and lungs and can lead to health problems like diabetes, heart disease, and some cancers. Where to find more information Talk with your health care provider or a dietitian about healthy eating and healthy lifestyle choices. You may also find information from:  [place] Department of Agriculture, MyPlate: FormerBoss.no  American Heart Association: www.heart.org  Centers for Disease Control and Prevention: http://www.wolf.info/ Summary  Staying at a healthy weight is important to your overall health. It helps  you to prevent certain diseases and health problems, such as heart disease, diabetes, joint problems, sleep disorders, and some types of cancer.  Being obese or overweight can cause you to develop joint or bone problems, which can make it hard for you to stay active or do activities you enjoy.  You can prevent unhealthy weight gain by eating a healthy diet, exercising  regularly, not smoking, limiting alcohol, and getting enough sleep.  Talk with your health care provider or a dietitian for guidance about healthy eating and healthy lifestyle choices. This information is not intended to replace advice given to you by your health care provider. Make sure you discuss any questions you have with your health care provider. Document Released: 04/01/2016 Document Revised: 01/09/2017 Document Reviewed: 05/07/2016 Elsevier Interactive Patient Education  2019 [employer].   10 day smoothie cleanse [place] [employer]   Keep a food log and bring to next visit  Increase your physical activity to 30 minutes 5 days a week at least.

## 2018-05-28 NOTE — Addendum Note (Signed)
Addended by: Roxine Caddy on: 05/28/2018 04:36 PM   Modules accepted: Orders

## 2018-05-31 LAB — HEMOGLOBIN A1C
Est. average glucose Bld gHb Est-mCnc: 111 mg/dL
Hgb A1c MFr Bld: 5.5 % (ref 4.8–5.6)

## 2018-05-31 LAB — CBC
Hematocrit: 44.9 % (ref 34.0–46.6)
Hemoglobin: 15.5 g/dL (ref 11.1–15.9)
MCH: 29.7 pg (ref 26.6–33.0)
MCHC: 34.5 g/dL (ref 31.5–35.7)
MCV: 86 fL (ref 79–97)
Platelets: 273 10*3/uL (ref 150–450)
RBC: 5.22 x10E6/uL (ref 3.77–5.28)
RDW: 12.6 % (ref 11.7–15.4)
WBC: 6.1 10*3/uL (ref 3.4–10.8)

## 2018-05-31 LAB — LIPID PANEL
Chol/HDL Ratio: 3.6 ratio (ref 0.0–4.4)
Cholesterol, Total: 187 mg/dL (ref 100–199)
HDL: 52 mg/dL (ref >39)
LDL CALC: 122 mg/dL — AB (ref 0–99)
Triglycerides: 67 mg/dL (ref 0–149)
VLDL Cholesterol Cal: 13 mg/dL (ref 5–40)

## 2018-05-31 LAB — CMP14 + ANION GAP
ALT: 16 IU/L (ref 0–32)
AST: 18 IU/L (ref 0–40)
Albumin/Globulin Ratio: 1.7 (ref 1.2–2.2)
Albumin: 4.7 g/dL (ref 3.8–4.8)
Alkaline Phosphatase: 76 IU/L (ref 39–117)
Anion Gap: 16 mmol/L (ref 10.0–18.0)
BUN/Creatinine Ratio: 13 (ref 9–23)
BUN: 14 mg/dL (ref 6–24)
Bilirubin Total: 0.3 mg/dL (ref 0.0–1.2)
CO2: 29 mmol/L (ref 20–29)
Calcium: 10 mg/dL (ref 8.7–10.2)
Chloride: 100 mmol/L (ref 96–106)
Creatinine, Ser: 1.12 mg/dL — ABNORMAL HIGH (ref 0.57–1.00)
GFR calc Af Amer: 67 mL/min/{1.73_m2} (ref >59)
GFR calc non Af Amer: 58 mL/min/{1.73_m2} — ABNORMAL LOW (ref >59)
Globulin, Total: 2.7 g/dL (ref 1.5–4.5)
Glucose: 90 mg/dL (ref 65–99)
Potassium: 4.1 mmol/L (ref 3.5–5.2)
Sodium: 145 mmol/L — ABNORMAL HIGH (ref 134–144)
Total Protein: 7.4 g/dL (ref 6.0–8.5)

## 2018-05-31 LAB — TSH: TSH: 1.55 u[IU]/mL (ref 0.450–4.500)

## 2018-05-31 LAB — HIV ANTIBODY (ROUTINE TESTING W REFLEX): HIV Screen 4th Generation wRfx: NONREACTIVE

## 2018-05-31 LAB — T PALLIDUM SCREENING CASCADE: T PALLIDUM ANTIBODIES (TP-PA): NONREACTIVE

## 2018-07-08 ENCOUNTER — Encounter: Payer: Self-pay | Admitting: Nurse Practitioner

## 2018-07-30 ENCOUNTER — Ambulatory Visit: Payer: Self-pay | Admitting: Nurse Practitioner

## 2018-08-03 ENCOUNTER — Ambulatory Visit: Payer: Self-pay | Admitting: Nurse Practitioner

## 2018-09-30 ENCOUNTER — Other Ambulatory Visit: Payer: Self-pay | Admitting: Nurse Practitioner

## 2018-11-15 ENCOUNTER — Other Ambulatory Visit: Payer: Self-pay | Admitting: Nurse Practitioner

## 2018-11-15 ENCOUNTER — Telehealth: Payer: Self-pay

## 2018-11-15 NOTE — Telephone Encounter (Signed)
Left message to schedule appt for refills

## 2018-11-16 ENCOUNTER — Ambulatory Visit (INDEPENDENT_AMBULATORY_CARE_PROVIDER_SITE_OTHER): Payer: 59 | Admitting: Nurse Practitioner

## 2018-11-16 ENCOUNTER — Other Ambulatory Visit: Payer: Self-pay

## 2018-11-16 ENCOUNTER — Encounter: Payer: Self-pay | Admitting: Nurse Practitioner

## 2018-11-16 VITALS — BP 110/72 | HR 66 | Temp 98.9°F | Ht 63.8 in | Wt 178.0 lb

## 2018-11-16 DIAGNOSIS — I10 Essential (primary) hypertension: Secondary | ICD-10-CM | POA: Diagnosis not present

## 2018-11-16 DIAGNOSIS — Z6830 Body mass index (BMI) 30.0-30.9, adult: Secondary | ICD-10-CM | POA: Diagnosis not present

## 2018-11-16 DIAGNOSIS — E6609 Other obesity due to excess calories: Secondary | ICD-10-CM | POA: Diagnosis not present

## 2018-11-16 LAB — BMP8+EGFR
BUN/Creatinine Ratio: 12 (ref 9–23)
BUN: 14 mg/dL (ref 6–24)
CO2: 26 mmol/L (ref 20–29)
Calcium: 9.7 mg/dL (ref 8.7–10.2)
Chloride: 100 mmol/L (ref 96–106)
Creatinine, Ser: 1.13 mg/dL — ABNORMAL HIGH (ref 0.57–1.00)
GFR calc Af Amer: 65 mL/min/{1.73_m2} (ref >59)
GFR calc non Af Amer: 57 mL/min/{1.73_m2} — ABNORMAL LOW (ref >59)
Glucose: 93 mg/dL (ref 65–99)
Potassium: 4 mmol/L (ref 3.5–5.2)
Sodium: 142 mmol/L (ref 134–144)

## 2018-11-16 MED ORDER — HYDROCHLOROTHIAZIDE 25 MG PO TABS: 25.0000 mg | ORAL_TABLET | Freq: Every day | ORAL | 1 refills | Status: DC

## 2018-11-16 MED ORDER — ATENOLOL 50 MG PO TABS: 50.0000 mg | ORAL_TABLET | Freq: Every day | ORAL | 1 refills | Status: DC

## 2018-11-16 NOTE — Patient Instructions (Signed)

## 2018-11-16 NOTE — Progress Notes (Signed)
Subjective:     Patient ID: Renee Li , female    DOB: 1970/04/27 , 48 y.o.   MRN: [id number]   Chief Complaint  Patient presents with  . Hypertension    HPI  Wt Readings from Last 3 Encounters: 11/16/18 : 178 lb (80.7 kg) 05/28/18 : 176 lb 3.2 oz (79.9 kg) 01/12/17 : 171 lb 6.4 oz (77.7 kg)  Continues to eat well and belly dancing. Her college aged daughter is now home with her due to [place].  She is also newly divorced.  Hypertension This is a chronic problem. The current episode started more than 1 year ago. The problem is unchanged. The problem is controlled. Pertinent negatives include no anxiety, chest pain, headaches or palpitations. There are no associated agents to hypertension. There are no known risk factors for coronary artery disease. Past treatments include diuretics. The current treatment provides no improvement. There are no compliance problems.  There is no history of angina or kidney disease. There is no history of chronic renal disease.     Past Medical History:  Diagnosis Date  . Depression   . Heart murmur   . Hypertension      Family History  Problem Relation Age of Onset  . Cancer Mother        throat  . Stroke Maternal Grandmother   . Diabetes Maternal Grandmother   . Cancer Maternal Grandfather        prostate  . Heart disease Paternal Grandmother      Current Outpatient Medications:  .  aspirin-acetaminophen-caffeine (EXCEDRIN MIGRAINE) 250-250-65 MG per tablet, Take 1 tablet by mouth every 6 (six) hours as needed for headache., Disp: , Rfl:  .  atenolol (TENORMIN) 50 MG tablet, Take 1 tablet (50 mg total) by mouth daily., Disp: 30 tablet, Rfl: 0 .  cyclobenzaprine (FLEXERIL) 5 MG tablet, 1 pill by mouth up to every 8 hours as needed. Start with one pill by mouth each bedtime as needed due to sedation, Disp: 15 tablet, Rfl: 0 .  hydrochlorothiazide (HYDRODIURIL) 25 MG tablet, Take 1 tablet (25 mg total) by mouth daily. PATIENT NEEDS AN  APPOINTMENT, Disp: 90 tablet, Rfl: 1   Allergies  Allergen Reactions  . Aleve [Naproxen] Hives  . Ibuprofen Hives     Review of Systems  Constitutional: Negative.   Respiratory: Negative.   Cardiovascular: Negative.  Negative for chest pain, palpitations and leg swelling.  Neurological: Negative.  Negative for dizziness and headaches.     Today's Vitals   11/16/18 0844  BP: 110/72  Pulse: 66  Temp: 98.9 F (37.2 C)  TempSrc: Oral  Weight: 178 lb (80.7 kg)  Height: 5' 3.8" (1.621 m)  PainSc: 0-No pain   Body mass index is 30.75 kg/m.   Objective:  Physical Exam Constitutional:      Appearance: Normal appearance.  Cardiovascular:     Rate and Rhythm: Normal rate and regular rhythm.  Skin:    Capillary Refill: Capillary refill takes less than 2 seconds.  Neurological:     General: No focal deficit present.     Mental Status: She is alert and oriented to person, place, and time.  Psychiatric:        Mood and Affect: Mood normal.        Behavior: Behavior normal.        Thought Content: Thought content normal.        Judgment: Judgment normal.         Assessment And  Plan:     1. Essential hypertension B/P is controlled.  BMP ordered to check renal function.  The importance of regular exercise and dietary modification was stressed to the patient.  Stressed importance of losing ten percent of her body weight to help with B/P control.  The weight loss would help with decreasing cardiac and cancer risk as well.   2. Class 1 obesity due to excess calories without serious comorbidity with body mass index (BMI) of 30.0 to 30.9 in adult  Chronic  Continue with healthy diet and regular exercise options   Continue to exercise at least 150 minutes per week with 2 days of strength training  Her weight has maintained about the same however she feels she is doing well with her diet and physical activity.   [name], FNP    THE PATIENT IS ENCOURAGED TO  PRACTICE SOCIAL DISTANCING DUE TO THE COVID-19 PANDEMIC.

## 2018-11-22 ENCOUNTER — Encounter: Payer: Self-pay | Admitting: Nurse Practitioner

## 2019-01-18 ENCOUNTER — Other Ambulatory Visit: Payer: Self-pay

## 2019-01-18 DIAGNOSIS — I10 Essential (primary) hypertension: Secondary | ICD-10-CM

## 2019-01-18 MED ORDER — ATENOLOL 50 MG PO TABS: 25.0000 mg | ORAL_TABLET | Freq: Two times a day (BID) | ORAL | 1 refills | Status: DC

## 2019-02-23 ENCOUNTER — Encounter: Payer: Self-pay | Admitting: Nurse Practitioner

## 2019-02-23 ENCOUNTER — Other Ambulatory Visit: Payer: Self-pay

## 2019-02-23 ENCOUNTER — Ambulatory Visit: Payer: 59 | Admitting: Nurse Practitioner

## 2019-02-23 VITALS — BP 110/70 | HR 64 | Temp 98.9°F | Ht 63.4 in | Wt 178.6 lb

## 2019-02-23 DIAGNOSIS — I10 Essential (primary) hypertension: Secondary | ICD-10-CM

## 2019-02-23 DIAGNOSIS — Z1211 Encounter for screening for malignant neoplasm of colon: Secondary | ICD-10-CM

## 2019-02-23 DIAGNOSIS — Z30433 Encounter for removal and reinsertion of intrauterine contraceptive device: Secondary | ICD-10-CM

## 2019-02-23 MED ORDER — ATENOLOL 50 MG PO TABS: 50.0000 mg | ORAL_TABLET | Freq: Every day | ORAL | 1 refills | Status: DC

## 2019-02-23 NOTE — Progress Notes (Signed)
Subjective:     Patient ID: Renee Li , female    DOB: 10-30-1970 , 48 y.o.   MRN: [id number]   Chief Complaint  Patient presents with  . Hypertension    HPI  She has started back on HCTZ when her blood pressure was increasing to 130/80's.  Doing well. She is now jogging.      Hypertension This is a chronic problem. The current episode started more than 1 year ago. The problem is unchanged. The problem is controlled. Pertinent negatives include no anxiety, chest pain, headaches or palpitations. There are no associated agents to hypertension. There are no known risk factors for coronary artery disease. Past treatments include diuretics. The current treatment provides no improvement. There are no compliance problems.  There is no history of angina or kidney disease. There is no history of chronic renal disease.     Past Medical History:  Diagnosis Date  . Depression   . Heart murmur   . Hypertension      Family History  Problem Relation Age of Onset  . Cancer Mother        throat  . Stroke Maternal Grandmother   . Diabetes Maternal Grandmother   . Cancer Maternal Grandfather        prostate  . Heart disease Paternal Grandmother      Current Outpatient Medications:  .  aspirin-acetaminophen-caffeine (EXCEDRIN MIGRAINE) 250-250-65 MG per tablet, Take 1 tablet by mouth every 6 (six) hours as needed for headache., Disp: , Rfl:  .  atenolol (TENORMIN) 50 MG tablet, Take 0.5 tablets (25 mg total) by mouth 2 (two) times daily., Disp: 90 tablet, Rfl: 1 .  hydrochlorothiazide (HYDRODIURIL) 25 MG tablet, Take 1 tablet (25 mg total) by mouth daily., Disp: 90 tablet, Rfl: 1 .  cyclobenzaprine (FLEXERIL) 5 MG tablet, 1 pill by mouth up to every 8 hours as needed. Start with one pill by mouth each bedtime as needed due to sedation (Patient not taking: Reported on 02/23/2019), Disp: 15 tablet, Rfl: 0   Allergies  Allergen Reactions  . Aleve [Naproxen] Hives  . Ibuprofen Hives      Review of Systems  Constitutional: Negative.  Negative for fatigue.  Respiratory: Negative.   Cardiovascular: Negative.  Negative for chest pain, palpitations and leg swelling.  Endocrine: Negative for polydipsia, polyphagia and polyuria.  Neurological: Negative.  Negative for dizziness and headaches.  Psychiatric/Behavioral: Negative.      Today's Vitals   02/23/19 0841  BP: 110/70  Pulse: 64  Temp: 98.9 F (37.2 C)  TempSrc: Oral  Weight: 178 lb 9.6 oz (81 kg)  Height: 5' 3.4" (1.61 m)  PainSc: 0-No pain   Body mass index is 31.24 kg/m.   Objective:  Physical Exam Constitutional:      Appearance: Normal appearance.  Cardiovascular:     Rate and Rhythm: Normal rate and regular rhythm.  Skin:    Capillary Refill: Capillary refill takes less than 2 seconds.  Neurological:     General: No focal deficit present.     Mental Status: She is alert and oriented to person, place, and time.  Psychiatric:        Mood and Affect: Mood normal.        Behavior: Behavior normal.        Thought Content: Thought content normal.        Judgment: Judgment normal.         Assessment And Plan:     1. Essential  hypertension B/P is controlled with the HCTZ added back.   BMP ordered to check renal function.  The importance of regular exercise and dietary modification was stressed to the patient - atenolol (TENORMIN) 50 MG tablet; Take 1 tablet (50 mg total) by mouth daily.  Dispense: 90 tablet; Refill: 1  2. Encounter for screening colonoscopy  I have given her the phone number of Dr. Hilarie Fredrickson she did not go to appt due to Covid in Feb 2020  New referral placed as well - Ambulatory referral to Gastroenterology  3. Encounter for removal and reinsertion of intrauterine contraceptive device (IUD)  I have given her the phone number of Dr. Charlesetta Garibaldi for IUD replacement she did not go to appt due to Covid in Feb 2020  New referral placed as well - Ambulatory referral to Obstetrics  / Gynecology    . She is scheduled to have her mammogram on Monday.      Renee Brine, FNP    THE PATIENT IS ENCOURAGED TO PRACTICE SOCIAL DISTANCING DUE TO THE COVID-19 PANDEMIC.

## 2019-02-24 ENCOUNTER — Telehealth: Payer: Self-pay | Admitting: *Deleted

## 2019-02-25 ENCOUNTER — Other Ambulatory Visit: Payer: Self-pay | Admitting: Nurse Practitioner

## 2019-02-25 DIAGNOSIS — I10 Essential (primary) hypertension: Secondary | ICD-10-CM

## 2019-02-25 NOTE — Telephone Encounter (Signed)
error 

## 2019-02-28 LAB — HM MAMMOGRAPHY

## 2019-03-01 ENCOUNTER — Encounter: Payer: Self-pay | Admitting: Nurse Practitioner

## 2019-04-05 ENCOUNTER — Encounter: Payer: Self-pay | Admitting: Internal Medicine

## 2019-04-21 LAB — HM PAP SMEAR

## 2019-04-29 ENCOUNTER — Ambulatory Visit (AMBULATORY_SURGERY_CENTER): Payer: Self-pay | Admitting: *Deleted

## 2019-04-29 ENCOUNTER — Other Ambulatory Visit: Payer: Self-pay

## 2019-04-29 VITALS — Ht 63.0 in | Wt 175.0 lb

## 2019-04-29 DIAGNOSIS — Z1159 Encounter for screening for other viral diseases: Secondary | ICD-10-CM

## 2019-04-29 DIAGNOSIS — Z01818 Encounter for other preprocedural examination: Secondary | ICD-10-CM

## 2019-04-29 DIAGNOSIS — Z1211 Encounter for screening for malignant neoplasm of colon: Secondary | ICD-10-CM

## 2019-04-29 MED ORDER — SUPREP BOWEL PREP KIT 17.5-3.13-1.6 GM/177ML PO SOLN: 1.0000 | Freq: Once | ORAL | 0 refills | Status: AC

## 2019-04-29 NOTE — Progress Notes (Signed)
Patient denies any allergies to egg or soy products. Patient denies complications with anesthesia/sedation.  Patient denies oxygen use at home and denies diet medications. Emmi instructions for colonoscopy/endoscopy explained and given to patient.  Suprep coupon given.   

## 2019-05-04 ENCOUNTER — Encounter: Payer: Self-pay | Admitting: Internal Medicine

## 2019-05-10 ENCOUNTER — Ambulatory Visit (INDEPENDENT_AMBULATORY_CARE_PROVIDER_SITE_OTHER): Payer: 59

## 2019-05-10 ENCOUNTER — Other Ambulatory Visit: Payer: Self-pay | Admitting: Internal Medicine

## 2019-05-10 DIAGNOSIS — Z1159 Encounter for screening for other viral diseases: Secondary | ICD-10-CM

## 2019-05-10 LAB — SARS CORONAVIRUS 2 (TAT 6-24 HRS): SARS Coronavirus 2: NEGATIVE

## 2019-05-13 ENCOUNTER — Other Ambulatory Visit: Payer: Self-pay

## 2019-05-13 ENCOUNTER — Encounter: Payer: Self-pay | Admitting: Internal Medicine

## 2019-05-13 ENCOUNTER — Ambulatory Visit (AMBULATORY_SURGERY_CENTER): Payer: 59 | Admitting: Internal Medicine

## 2019-05-13 VITALS — BP 126/74 | HR 60 | Temp 97.1°F | Resp 13 | Ht 63.0 in | Wt 175.0 lb

## 2019-05-13 DIAGNOSIS — D122 Benign neoplasm of ascending colon: Secondary | ICD-10-CM | POA: Diagnosis not present

## 2019-05-13 DIAGNOSIS — D123 Benign neoplasm of transverse colon: Secondary | ICD-10-CM | POA: Diagnosis not present

## 2019-05-13 DIAGNOSIS — Z1211 Encounter for screening for malignant neoplasm of colon: Secondary | ICD-10-CM

## 2019-05-13 MED ORDER — SODIUM CHLORIDE 0.9 % IV SOLN: 500.0000 mL | Freq: Once | INTRAVENOUS | Status: DC

## 2019-05-13 NOTE — Progress Notes (Signed)
To PACU, VSS. Report to [email or web address] 

## 2019-05-13 NOTE — Op Note (Signed)
[place] Patient Name: Renee Li Procedure Date: 05/13/2019 2:39 PM MRN: [id number] Endoscopist: Jerene Bears , MD Age: 49 Referring MD:  Date of Birth: 1970/09/09 Gender: Female Account #: [id number] Procedure:                Colonoscopy Indications:              Screening for colorectal malignant neoplasm, This                            is the patient's first colonoscopy Medicines:                Monitored Anesthesia Care Procedure:                Pre-Anesthesia Assessment:                           - Prior to the procedure, a History and Physical                            was performed, and patient medications and                            allergies were reviewed. The patient's tolerance of                            previous anesthesia was also reviewed. The risks                            and benefits of the procedure and the sedation                            options and risks were discussed with the patient.                            All questions were answered, and informed consent                            was obtained. Prior Anticoagulants: The patient has                            taken no previous anticoagulant or antiplatelet                            agents. ASA Grade Assessment: II - A patient with                            mild systemic disease. After reviewing the risks                            and benefits, the patient was deemed in                            satisfactory condition to undergo the procedure.  After obtaining informed consent, the colonoscope                            was passed under direct vision. Throughout the                            procedure, the patient's blood pressure, pulse, and                            oxygen saturations were monitored continuously. The                            Colonoscope was introduced through the anus and                            advanced to the cecum,  identified by appendiceal                            orifice and ileocecal valve. The colonoscopy was                            performed without difficulty. The patient tolerated                            the procedure well. The quality of the bowel                            preparation was good. The ileocecal valve,                            appendiceal orifice, and rectum were photographed. Scope In: 2:44:48 PM Scope Out: 3:01:06 PM Scope Withdrawal Time: 0 hours 7 minutes 23 seconds  Total Procedure Duration: 0 hours 16 minutes 18 seconds  Findings:                 The digital rectal exam was normal.                           Two sessile polyps were found in the proximal                            transverse colon (1) and ascending colon (1). The                            polyps were 4 to 5 mm in size. These polyps were                            removed with a cold snare. Resection and retrieval                            were complete.                           The exam was otherwise without abnormality on  direct and retroflexion views. Complications:            No immediate complications. Estimated Blood Loss:     Estimated blood loss was minimal. Impression:               - Two 4 to 5 mm polyps in the proximal transverse                            colon and in the ascending colon, removed with a                            cold snare. Resected and retrieved.                           - The examination was otherwise normal on direct                            and retroflexion views. Recommendation:           - Patient has a contact number available for                            emergencies. The signs and symptoms of potential                            delayed complications were discussed with the                            patient. Return to normal activities tomorrow.                            Written discharge instructions were provided to the                             patient.                           - Resume previous diet.                           - Continue present medications.                           - Await pathology results.                           - Repeat colonoscopy is recommended for                            surveillance. The colonoscopy date will be                            determined after pathology results from today's                            exam become available for review. Jerene Bears, MD 05/13/2019 3:05:27 PM This report  has been signed electronically.

## 2019-05-13 NOTE — Progress Notes (Signed)
No problems noted in the recovery room. maw 

## 2019-05-13 NOTE — Progress Notes (Signed)
Called to room to assist during endoscopic procedure.  Patient ID and intended procedure confirmed with present staff. Received instructions for my participation in the procedure from the performing physician.  

## 2019-05-13 NOTE — Patient Instructions (Signed)
YOU HAD AN ENDOSCOPIC PROCEDURE TODAY AT THE [facility]:   Refer to the procedure report that was given to you for any specific questions about what was found during the examination.  If the procedure report does not answer your questions, please call your gastroenterologist to clarify.  If you requested that your care partner not be given the details of your procedure findings, then the procedure report has been included in a sealed envelope for you to review at your convenience later.  YOU SHOULD EXPECT: Some feelings of bloating in the abdomen. Passage of more gas than usual.  Walking can help get rid of the air that was put into your GI tract during the procedure and reduce the bloating. If you had a lower endoscopy (such as a colonoscopy or flexible sigmoidoscopy) you may notice spotting of blood in your stool or on the toilet paper. If you underwent a bowel prep for your procedure, you may not have a normal bowel movement for a few days.  Please Note:  You might notice some irritation and congestion in your nose or some drainage.  This is from the oxygen used during your procedure.  There is no need for concern and it should clear up in a day or so.  SYMPTOMS TO REPORT IMMEDIATELY:   Following lower endoscopy (colonoscopy or flexible sigmoidoscopy):  Excessive amounts of blood in the stool  Significant tenderness or worsening of abdominal pains  Swelling of the abdomen that is new, acute  Fever of 100F or higher    For urgent or emergent issues, a gastroenterologist can be reached at any hour by calling [phone].   DIET:  We do recommend a small meal at first, but then you may proceed to your regular diet.  Drink plenty of fluids but you should avoid alcoholic beverages for 24 hours.  ACTIVITY:  You should plan to take it easy for the rest of today and you should NOT DRIVE or use heavy machinery until tomorrow (because of the sedation medicines used during the test).     FOLLOW UP: Our staff will call the number listed on your records 48-72 hours following your procedure to check on you and address any questions or concerns that you may have regarding the information given to you following your procedure. If we do not reach you, we will leave a message.  We will attempt to reach you two times.  During this call, we will ask if you have developed any symptoms of COVID 19. If you develop any symptoms (ie: fever, flu-like symptoms, shortness of breath, cough etc.) before then, please call [phone].  If you test positive for Covid 19 in the 2 weeks post procedure, please call and report this information to us.    If any biopsies were taken you will be contacted by phone or by letter within the next 1-3 weeks.  Please call us at [phone] if you have not heard about the biopsies in 3 weeks.    SIGNATURES/CONFIDENTIALITY: You and/or your care partner have signed paperwork which will be entered into your electronic medical record.  These signatures attest to the fact that that the information above on your After Visit Summary has been reviewed and is understood.  Full responsibility of the confidentiality of this discharge information lies with you and/or your care-partner.    Handout was given to you on polyps.  You may resume your current medications today. Await biopsy results. Please call if any questions   or concerns.   

## 2019-05-13 NOTE — Progress Notes (Signed)
VS- [facility] Temperature- Renee Li  Pt's states no medical or surgical changes since previsit or office visit.

## 2019-05-17 ENCOUNTER — Telehealth: Payer: Self-pay

## 2019-05-17 NOTE — Telephone Encounter (Signed)
  Follow up Call-  Call back number 05/13/2019  Post procedure Call Back phone  # [phone]  Permission to leave phone message Yes  Some recent data might be hidden     Patient questions:  Do you have a fever, pain , or abdominal swelling? No. Pain Score  0 *  Have you tolerated food without any problems? Yes.    Have you been able to return to your normal activities? Yes.    Do you have any questions about your discharge instructions: Diet   No. Medications  No. Follow up visit  No.  Do you have questions or concerns about your Care? No.  Actions: * If pain score is 4 or above: No action needed, pain <4.  1. Have you developed a fever since your procedure? no  2.   Have you had an respiratory symptoms (SOB or cough) since your procedure? no  3.   Have you tested positive for COVID 19 since your procedure no  4.   Have you had any family members/close contacts diagnosed with the COVID 19 since your procedure?  no   If yes to any of these questions please route to Joylene John, RN and Alphonsa Gin, Therapist, sports.

## 2019-05-17 NOTE — Telephone Encounter (Signed)
Left message on follow up call. 

## 2019-05-18 ENCOUNTER — Encounter: Payer: Self-pay | Admitting: Internal Medicine

## 2019-06-03 ENCOUNTER — Encounter: Payer: Self-pay | Admitting: Nurse Practitioner

## 2019-06-07 ENCOUNTER — Ambulatory Visit (INDEPENDENT_AMBULATORY_CARE_PROVIDER_SITE_OTHER): Payer: 59 | Admitting: Nurse Practitioner

## 2019-06-07 ENCOUNTER — Other Ambulatory Visit: Payer: Self-pay

## 2019-06-07 ENCOUNTER — Encounter: Payer: Self-pay | Admitting: Nurse Practitioner

## 2019-06-07 VITALS — BP 120/82 | HR 67 | Temp 98.5°F | Ht 64.2 in | Wt 175.4 lb

## 2019-06-07 DIAGNOSIS — Z6830 Body mass index (BMI) 30.0-30.9, adult: Secondary | ICD-10-CM

## 2019-06-07 DIAGNOSIS — E6609 Other obesity due to excess calories: Secondary | ICD-10-CM | POA: Diagnosis not present

## 2019-06-07 DIAGNOSIS — Z0000 Encounter for general adult medical examination without abnormal findings: Secondary | ICD-10-CM | POA: Diagnosis not present

## 2019-06-07 DIAGNOSIS — Z566 Other physical and mental strain related to work: Secondary | ICD-10-CM

## 2019-06-07 DIAGNOSIS — I10 Essential (primary) hypertension: Secondary | ICD-10-CM

## 2019-06-07 DIAGNOSIS — F419 Anxiety disorder, unspecified: Secondary | ICD-10-CM

## 2019-06-07 LAB — POCT UA - MICROALBUMIN
Albumin/Creatinine Ratio, Urine, POC: 30
Creatinine, POC: 200 mg/dL
Microalbumin Ur, POC: 30 mg/L

## 2019-06-07 LAB — POCT URINALYSIS DIPSTICK
Bilirubin, UA: NEGATIVE
Glucose, UA: NEGATIVE
Ketones, UA: NEGATIVE
Nitrite, UA: NEGATIVE
Protein, UA: NEGATIVE
Spec Grav, UA: >=1.03 — AB (ref 1.010–1.025)
Urobilinogen, UA: 0.2 E.U./dL
pH, UA: 5.5 (ref 5.0–8.0)

## 2019-06-07 MED ORDER — PHENTERMINE HCL 15 MG PO CAPS: 15.0000 mg | ORAL_CAPSULE | ORAL | 1 refills | Status: DC

## 2019-06-07 MED ORDER — HYDROXYZINE PAMOATE 25 MG PO CAPS: 25.0000 mg | ORAL_CAPSULE | Freq: Three times a day (TID) | ORAL | 1 refills | Status: DC | PRN

## 2019-06-07 MED ORDER — HYDROCHLOROTHIAZIDE 25 MG PO TABS: 25.0000 mg | ORAL_TABLET | Freq: Every day | ORAL | 1 refills | Status: DC

## 2019-06-07 MED ORDER — ATENOLOL 50 MG PO TABS: 50.0000 mg | ORAL_TABLET | Freq: Every day | ORAL | 1 refills | Status: DC

## 2019-06-07 NOTE — Patient Instructions (Signed)
Health Maintenance  Topic Date Due  . PAP SMEAR-Modifier  04/24/2019  . INFLUENZA VACCINE  07/13/2019 (Originally 11/13/2018)  . MAMMOGRAM  02/27/2021  . COLONOSCOPY  05/12/2026  . TETANUS/TDAP  07/11/2026  . HIV Screening  Completed   Health Maintenance, Female Adopting a healthy lifestyle and getting preventive care are important in promoting health and wellness. Ask your health care provider about:  The right schedule for you to have regular tests and exams.  Things you can do on your own to prevent diseases and keep yourself healthy. What should I know about diet, weight, and exercise? Eat a healthy diet   Eat a diet that includes plenty of vegetables, fruits, low-fat dairy products, and lean protein.  Do not eat a lot of foods that are high in solid fats, added sugars, or sodium. Maintain a healthy weight Body mass index (BMI) is used to identify weight problems. It estimates body fat based on height and weight. Your health care provider can help determine your BMI and help you achieve or maintain a healthy weight. Get regular exercise Get regular exercise. This is one of the most important things you can do for your health. Most adults should:  Exercise for at least 150 minutes each week. The exercise should increase your heart rate and make you sweat (moderate-intensity exercise).  Do strengthening exercises at least twice a week. This is in addition to the moderate-intensity exercise.  Spend less time sitting. Even light physical activity can be beneficial. Watch cholesterol and blood lipids Have your blood tested for lipids and cholesterol at 51 years of age, then have this test every 5 years. Have your cholesterol levels checked more often if:  Your lipid or cholesterol levels are high.  You are older than 51 years of age.  You are at high risk for heart disease. What should I know about cancer screening? Depending on your health history and family history, you may  need to have cancer screening at various ages. This may include screening for:  Breast cancer.  Cervical cancer.  Colorectal cancer.  Skin cancer.  Lung cancer. What should I know about heart disease, diabetes, and high blood pressure? Blood pressure and heart disease  High blood pressure causes heart disease and increases the risk of stroke. This is more likely to develop in people who have high blood pressure readings, are of African descent, or are overweight.  Have your blood pressure checked: ? Every 3-5 years if you are 51-94 years of age. ? Every year if you are 51 years old or older. Diabetes Have regular diabetes screenings. This checks your fasting blood sugar level. Have the screening done:  Once every three years after age 51 if you are at a normal weight and have a low risk for diabetes.  More often and at a younger age if you are overweight or have a high risk for diabetes. What should I know about preventing infection? Hepatitis B If you have a higher risk for hepatitis B, you should be screened for this virus. Talk with your health care provider to find out if you are at risk for hepatitis B infection. Hepatitis C Testing is recommended for:  Everyone born from [birth year] through 1967.  Anyone with known risk factors for hepatitis C. Sexually transmitted infections (STIs)  Get screened for STIs, including gonorrhea and chlamydia, if: ? You are sexually active and are younger than 51 years of age. ? You are older than 51 years of age and  your health care provider tells you that you are at risk for this type of infection. ? Your sexual activity has changed since you were last screened, and you are at increased risk for chlamydia or gonorrhea. Ask your health care provider if you are at risk.  Ask your health care provider about whether you are at high risk for HIV. Your health care provider may recommend a prescription medicine to help prevent HIV infection. If you  choose to take medicine to prevent HIV, you should first get tested for HIV. You should then be tested every 3 months for as long as you are taking the medicine. Pregnancy  If you are about to stop having your period (premenopausal) and you may become pregnant, seek counseling before you get pregnant.  Take 400 to 800 micrograms (mcg) of folic acid every day if you become pregnant.  Ask for birth control (contraception) if you want to prevent pregnancy. Osteoporosis and menopause Osteoporosis is a disease in which the bones lose minerals and strength with aging. This can result in bone fractures. If you are 51 years old or older, or if you are at risk for osteoporosis and fractures, ask your health care provider if you should:  Be screened for bone loss.  Take a calcium or vitamin D supplement to lower your risk of fractures.  Be given hormone replacement therapy (HRT) to treat symptoms of menopause. Follow these instructions at home: Lifestyle  Do not use any products that contain nicotine or tobacco, such as cigarettes, e-cigarettes, and chewing tobacco. If you need help quitting, ask your health care provider.  Do not use street drugs.  Do not share needles.  Ask your health care provider for help if you need support or information about quitting drugs. Alcohol use  Do not drink alcohol if: ? Your health care provider tells you not to drink. ? You are pregnant, may be pregnant, or are planning to become pregnant.  If you drink alcohol: ? Limit how much you use to 0-1 drink a day. ? Limit intake if you are breastfeeding.  Be aware of how much alcohol is in your drink. In the [place] one drink equals one 12 oz bottle of beer (355 mL), one 5 oz glass of wine (148 mL), or one 1 oz glass of hard liquor (44 mL). General instructions  Schedule regular health, dental, and eye exams.  Stay current with your vaccines.  Tell your health care provider if: ? You often feel depressed.  ? You have ever been abused or do not feel safe at home. Summary  Adopting a healthy lifestyle and getting preventive care are important in promoting health and wellness.  Follow your health care provider's instructions about healthy diet, exercising, and getting tested or screened for diseases.  Follow your health care provider's instructions on monitoring your cholesterol and blood pressure. This information is not intended to replace advice given to you by your health care provider. Make sure you discuss any questions you have with your health care provider. Document Revised: 03/24/2018 Document Reviewed: 03/24/2018 Elsevier Patient Education  [place] over the counter magnesium sulfate 200 mg with evening meal.   Do not take vistaril with phentermine.

## 2019-06-07 NOTE — Progress Notes (Signed)
This visit occurred during the SARS-CoV-2 public health emergency.  Safety protocols were in place, including screening questions prior to the visit, additional usage of staff PPE, and extensive cleaning of exam room while observing appropriate contact time as indicated for disinfecting solutions.  Subjective:     Patient ID: Renee Li , female    DOB: 10-Nov-1970 , 49 y.o.   MRN: [id number]   Chief Complaint  Patient presents with  . Annual Exam    HPI  Here for HM   She is working from home now.  She feels like she is stressed at work.  It is taking her longer to decompress. She works for the [employer].  She has had FMLA completed in 2018.  Wt Readings from Last 3 Encounters: 06/07/19 : 175 lb 6.4 oz (79.6 kg) 05/13/19 : 175 lb (79.4 kg) 04/29/19 : 175 lb (79.4 kg)    The patient states she uses IUD for birth control. Last LMP was No LMP recorded. (Menstrual status: IUD).. Negative for Dysmenorrhea and Negative for Menorrhagia Mammogram last done 02/28/2019. Negative for: breast discharge, breast lump(s), breast pain and breast self exam.  Pertinent negatives include abnormal bleeding (hematology), anxiety, decreased libido, depression, difficulty falling sleep, dyspareunia, history of infertility, nocturia, sexual dysfunction, sleep disturbances, urinary incontinence, urinary urgency, vaginal discharge and vaginal itching. Diet regular. The patient states her exercise level is none.      The patient's tobacco use is:  Social History   Tobacco Use  Smoking Status Never Smoker  Smokeless Tobacco Never Used   She has been exposed to passive smoke. The patient's alcohol use is:  Social History   Substance and Sexual Activity  Alcohol Use Yes  . Alcohol/week: 1.0 standard drinks  . Types: 1 Glasses of wine per week   Additional information: Last pap by Dr. Charlesetta Garibaldi  Past Medical History:  Diagnosis Date  . Anemia   . Depression   . Heart murmur    never caused  any problems  . Hypertension   . Migraines      Family History  Problem Relation Age of Onset  . Cancer Mother        throat  . Stroke Maternal Grandmother   . Diabetes Maternal Grandmother   . Cancer Maternal Grandfather        prostate  . Heart disease Paternal Grandmother   . Colon cancer Neg Hx   . Rectal cancer Neg Hx   . Stomach cancer Neg Hx   . Esophageal cancer Neg Hx      Current Outpatient Medications:  .  aspirin-acetaminophen-caffeine (EXCEDRIN MIGRAINE) 250-250-65 MG per tablet, Take 1 tablet by mouth every 6 (six) hours as needed for headache., Disp: , Rfl:  .  atenolol (TENORMIN) 50 MG tablet, Take 1 tablet by mouth once daily, Disp: 90 tablet, Rfl: 0 .  hydrochlorothiazide (HYDRODIURIL) 25 MG tablet, Take 1 tablet (25 mg total) by mouth daily., Disp: 90 tablet, Rfl: 1 .  cyclobenzaprine (FLEXERIL) 5 MG tablet, 1 pill by mouth up to every 8 hours as needed. Start with one pill by mouth each bedtime as needed due to sedation (Patient not taking: Reported on 02/23/2019), Disp: 15 tablet, Rfl: 0   Allergies  Allergen Reactions  . Aleve [Naproxen] Hives  . Ibuprofen Hives     Review of Systems  Constitutional: Negative.   HENT: Negative.   Eyes: Negative.   Respiratory: Negative.   Cardiovascular: Negative.   Gastrointestinal: Negative.  Endocrine: Negative.   Genitourinary: Negative.   Musculoskeletal: Negative.   Skin: Negative.   Allergic/Immunologic: Negative.   Neurological: Negative.   Hematological: Negative.   Psychiatric/Behavioral: Negative.        Stressed at work.       Today's Vitals   06/07/19 0856  BP: 120/82  Pulse: 67  Temp: 98.5 F (36.9 C)  TempSrc: Oral  Weight: 175 lb 6.4 oz (79.6 kg)  Height: 5' 4.2" (1.631 m)  PainSc: 0-No pain   Body mass index is 29.92 kg/m.   Objective:  Physical Exam Constitutional:      Appearance: Normal appearance. She is well-developed.  HENT:     Head: Normocephalic and atraumatic.      Right Ear: Hearing, tympanic membrane, ear canal and external ear normal. There is no impacted cerumen.     Left Ear: Hearing, tympanic membrane, ear canal and external ear normal. There is no impacted cerumen.  Eyes:     General: Lids are normal.     Extraocular Movements: Extraocular movements intact.     Conjunctiva/sclera: Conjunctivae normal.     Pupils: Pupils are equal, round, and reactive to light.     Funduscopic exam:    Right eye: No papilledema.        Left eye: No papilledema.  Neck:     Thyroid: No thyroid mass.     Vascular: No carotid bruit.  Cardiovascular:     Rate and Rhythm: Normal rate and regular rhythm.     Pulses: Normal pulses.     Heart sounds: Normal heart sounds. No murmur.  Pulmonary:     Effort: Pulmonary effort is normal. No respiratory distress.     Breath sounds: Normal breath sounds.  Abdominal:     General: Abdomen is flat. Bowel sounds are normal.     Palpations: Abdomen is soft. There is no mass.  Musculoskeletal:        General: No swelling. Normal range of motion.     Cervical back: Full passive range of motion without pain, normal range of motion and neck supple.     Right lower leg: No edema.     Left lower leg: No edema.  Skin:    General: Skin is warm and dry.     Capillary Refill: Capillary refill takes less than 2 seconds.  Neurological:     General: No focal deficit present.     Mental Status: She is alert and oriented to person, place, and time.     Cranial Nerves: No cranial nerve deficit.     Sensory: No sensory deficit.  Psychiatric:        Mood and Affect: Mood normal.        Behavior: Behavior normal.        Thought Content: Thought content normal.        Judgment: Judgment normal.         Assessment And Plan:     1. Health maintenance examination . Behavior modifications discussed and diet history reviewed.   . Pt will continue to exercise regularly and modify diet with low GI, plant based foods and decrease intake  of processed foods.  . Recommend intake of daily multivitamin, Vitamin D, and calcium.  . Recommend mammogram and colonoscopy for preventive screenings, as well as recommend immunizations that include influenza, TDAP  2. Essential hypertension Chronic, well controlled Continue with current medications - POCT Urinalysis Dipstick (81002) - POCT UA - Microalbumin - EKG 12-Lead  3.  Class 1 obesity due to excess calories without serious comorbidity with body mass index (BMI) of 30.0 to 30.9 in adult  Will start her on phentermine  EKG done NSR  - phentermine 15 MG capsule; Take 1 capsule (15 mg total) by mouth every morning.  Dispense: 30 capsule; Refill: 1  4. Stress at work  Form brought in to complete for FMLA  Encouraged to focus on self care to help with this stress       [name], FNP    THE PATIENT IS ENCOURAGED TO PRACTICE SOCIAL DISTANCING DUE TO THE COVID-19 PANDEMIC.

## 2019-06-08 LAB — CBC
Hematocrit: 50.1 % — ABNORMAL HIGH (ref 34.0–46.6)
Hemoglobin: 16.8 g/dL — ABNORMAL HIGH (ref 11.1–15.9)
MCH: 29.1 pg (ref 26.6–33.0)
MCHC: 33.5 g/dL (ref 31.5–35.7)
MCV: 87 fL (ref 79–97)
Platelets: 221 10*3/uL (ref 150–450)
RBC: 5.77 x10E6/uL — ABNORMAL HIGH (ref 3.77–5.28)
RDW: 13 % (ref 11.7–15.4)
WBC: 6.4 10*3/uL (ref 3.4–10.8)

## 2019-06-08 LAB — LIPID PANEL
Chol/HDL Ratio: 4.1 ratio (ref 0.0–4.4)
Cholesterol, Total: 220 mg/dL — ABNORMAL HIGH (ref 100–199)
HDL: 54 mg/dL (ref >39)
LDL Chol Calc (NIH): 146 mg/dL — ABNORMAL HIGH (ref 0–99)
Triglycerides: 114 mg/dL (ref 0–149)
VLDL Cholesterol Cal: 20 mg/dL (ref 5–40)

## 2019-06-08 LAB — CMP14+EGFR
ALT: 11 IU/L (ref 0–32)
AST: 17 IU/L (ref 0–40)
Albumin/Globulin Ratio: 1.7 (ref 1.2–2.2)
Albumin: 5.2 g/dL — ABNORMAL HIGH (ref 3.8–4.9)
Alkaline Phosphatase: 101 IU/L (ref 39–117)
BUN/Creatinine Ratio: 13 (ref 9–23)
BUN: 14 mg/dL (ref 6–24)
Bilirubin Total: 0.6 mg/dL (ref 0.0–1.2)
CO2: 28 mmol/L (ref 20–29)
Calcium: 10 mg/dL (ref 8.7–10.2)
Chloride: 98 mmol/L (ref 96–106)
Creatinine, Ser: 1.12 mg/dL — ABNORMAL HIGH (ref 0.57–1.00)
GFR calc Af Amer: 66 mL/min/{1.73_m2} (ref >59)
GFR calc non Af Amer: 57 mL/min/{1.73_m2} — ABNORMAL LOW (ref >59)
Globulin, Total: 3 g/dL (ref 1.5–4.5)
Glucose: 76 mg/dL (ref 65–99)
Potassium: 3.7 mmol/L (ref 3.5–5.2)
Sodium: 142 mmol/L (ref 134–144)
Total Protein: 8.2 g/dL (ref 6.0–8.5)

## 2019-06-08 LAB — HEMOGLOBIN A1C
Est. average glucose Bld gHb Est-mCnc: 111 mg/dL
Hgb A1c MFr Bld: 5.5 % (ref 4.8–5.6)

## 2019-06-15 ENCOUNTER — Encounter: Payer: Self-pay | Admitting: Nurse Practitioner

## 2019-06-16 ENCOUNTER — Telehealth: Payer: Self-pay

## 2019-06-16 NOTE — Telephone Encounter (Signed)
I called patient to notify her that her forms have been completed and she can come pick them up. YRL,RMA

## 2019-08-08 ENCOUNTER — Other Ambulatory Visit: Payer: Self-pay

## 2019-08-08 ENCOUNTER — Other Ambulatory Visit: Payer: Self-pay | Admitting: Nurse Practitioner

## 2019-08-08 ENCOUNTER — Ambulatory Visit: Payer: 59 | Admitting: Nurse Practitioner

## 2019-08-08 ENCOUNTER — Encounter: Payer: Self-pay | Admitting: Nurse Practitioner

## 2019-08-08 VITALS — BP 116/80 | HR 73 | Temp 98.0°F | Ht 63.2 in | Wt 170.4 lb

## 2019-08-08 DIAGNOSIS — Z6830 Body mass index (BMI) 30.0-30.9, adult: Secondary | ICD-10-CM | POA: Diagnosis not present

## 2019-08-08 DIAGNOSIS — E6609 Other obesity due to excess calories: Secondary | ICD-10-CM | POA: Diagnosis not present

## 2019-08-08 DIAGNOSIS — I10 Essential (primary) hypertension: Secondary | ICD-10-CM | POA: Diagnosis not present

## 2019-08-08 MED ORDER — PHENTERMINE HCL 15 MG PO CAPS: 15.0000 mg | ORAL_CAPSULE | ORAL | 1 refills | Status: DC

## 2019-08-08 NOTE — Progress Notes (Signed)
This visit occurred during the SARS-CoV-2 public health emergency.  Safety protocols were in place, including screening questions prior to the visit, additional usage of staff PPE, and extensive cleaning of exam room while observing appropriate contact time as indicated for disinfecting solutions.  Subjective:     Patient ID: Renee Li , female    DOB: [birth year] , 49 y.o.   MRN: [id number]   Chief Complaint  Patient presents with  . Weight Check    HPI  Here for weight check  Wt Readings from Last 3 Encounters: 08/08/19 : 170 lb 6.4 oz (77.3 kg) 06/07/19 : 175 lb 6.4 oz (79.6 kg) 05/13/19 : 175 lb (79.4 kg)  Sometimes she will forget to eat.  Continues to be stressed at work.  She is walking in the morning for about 1 hour.  Total of about 10,000 steps.       Past Medical History:  Diagnosis Date  . Anemia   . Depression   . Heart murmur    never caused any problems  . Hypertension   . Migraines      Family History  Problem Relation Age of Onset  . Cancer Mother        throat  . Stroke Maternal Grandmother   . Diabetes Maternal Grandmother   . Cancer Maternal Grandfather        prostate  . Heart disease Paternal Grandmother   . Colon cancer Neg Hx   . Rectal cancer Neg Hx   . Stomach cancer Neg Hx   . Esophageal cancer Neg Hx      Current Outpatient Medications:  .  aspirin-acetaminophen-caffeine (EXCEDRIN MIGRAINE) 250-250-65 MG per tablet, Take 1 tablet by mouth every 6 (six) hours as needed for headache., Disp: , Rfl:  .  atenolol (TENORMIN) 50 MG tablet, Take 1 tablet (50 mg total) by mouth daily., Disp: 90 tablet, Rfl: 1 .  hydrochlorothiazide (HYDRODIURIL) 25 MG tablet, Take 1 tablet (25 mg total) by mouth daily., Disp: 90 tablet, Rfl: 1 .  phentermine 15 MG capsule, Take 1 capsule (15 mg total) by mouth every morning., Disp: 30 capsule, Rfl: 1 .  hydrOXYzine (VISTARIL) 25 MG capsule, Take 1 capsule (25 mg total) by mouth 3 (three) times daily  as needed. (Patient not taking: Reported on 08/08/2019), Disp: 30 capsule, Rfl: 1   Allergies  Allergen Reactions  . Aleve [Naproxen] Hives  . Ibuprofen Hives     Review of Systems  Constitutional: Negative.  Negative for fatigue.  Respiratory: Negative.   Cardiovascular: Negative.  Negative for chest pain, palpitations and leg swelling.  Psychiatric/Behavioral: Negative.      Today's Vitals   08/08/19 0852  BP: 116/80  Pulse: 73  Temp: 98 F (36.7 C)  TempSrc: Oral  SpO2: 97%  Weight: 170 lb 6.4 oz (77.3 kg)  Height: 5' 3.2" (1.605 m)  PainSc: 0-No pain   Body mass index is 29.99 kg/m.   Objective:  Physical Exam Vitals reviewed.  Constitutional:      General: She is not in acute distress.    Appearance: Normal appearance. She is well-developed.  HENT:     Head: Normocephalic and atraumatic.  Eyes:     Pupils: Pupils are equal, round, and reactive to light.  Cardiovascular:     Rate and Rhythm: Normal rate and regular rhythm.     Pulses: Normal pulses.     Heart sounds: Normal heart sounds. No murmur.  Pulmonary:     Effort:  Pulmonary effort is normal.     Breath sounds: Normal breath sounds.  Musculoskeletal:        General: Normal range of motion.  Skin:    General: Skin is warm and dry.     Capillary Refill: Capillary refill takes less than 2 seconds.  Neurological:     General: No focal deficit present.     Mental Status: She is alert and oriented to person, place, and time.     Cranial Nerves: No cranial nerve deficit.  Psychiatric:        Mood and Affect: Mood normal.        Behavior: Behavior normal.        Thought Content: Thought content normal.        Judgment: Judgment normal.         Assessment And Plan:     1. Class 1 obesity due to excess calories without serious comorbidity with body mass index (BMI) of 30.0 to 30.9 in adult  Tolerating phentermine well without any palpitations  She will occasionally not eat dinner  Encouraged to  continue with her physical activity  She has lost 5 lbs   Will refill phentermine at same dose  2. Essential hypertension  Chronic, blood pressure is well controlled.   [name], FNP    THE PATIENT IS ENCOURAGED TO PRACTICE SOCIAL DISTANCING DUE TO THE COVID-19 PANDEMIC.

## 2019-08-09 ENCOUNTER — Ambulatory Visit: Payer: 59 | Admitting: Nurse Practitioner

## 2019-10-10 ENCOUNTER — Other Ambulatory Visit: Payer: Self-pay | Admitting: Nurse Practitioner

## 2019-10-10 DIAGNOSIS — Z6830 Body mass index (BMI) 30.0-30.9, adult: Secondary | ICD-10-CM

## 2019-10-10 DIAGNOSIS — E6609 Other obesity due to excess calories: Secondary | ICD-10-CM

## 2019-10-10 NOTE — Telephone Encounter (Signed)
Phentermine refill. 

## 2019-11-02 ENCOUNTER — Other Ambulatory Visit: Payer: Self-pay

## 2019-11-02 ENCOUNTER — Ambulatory Visit: Payer: 59 | Admitting: Nurse Practitioner

## 2019-11-02 ENCOUNTER — Other Ambulatory Visit: Payer: Self-pay | Admitting: Nurse Practitioner

## 2019-11-02 ENCOUNTER — Encounter: Payer: Self-pay | Admitting: Nurse Practitioner

## 2019-11-02 VITALS — BP 120/84 | HR 60 | Temp 98.8°F | Ht 62.8 in | Wt 160.4 lb

## 2019-11-02 DIAGNOSIS — I10 Essential (primary) hypertension: Secondary | ICD-10-CM

## 2019-11-02 DIAGNOSIS — E7800 Pure hypercholesterolemia, unspecified: Secondary | ICD-10-CM | POA: Diagnosis not present

## 2019-11-02 DIAGNOSIS — E663 Overweight: Secondary | ICD-10-CM

## 2019-11-02 DIAGNOSIS — Z6828 Body mass index (BMI) 28.0-28.9, adult: Secondary | ICD-10-CM

## 2019-11-02 DIAGNOSIS — Z1159 Encounter for screening for other viral diseases: Secondary | ICD-10-CM

## 2019-11-02 MED ORDER — PHENTERMINE HCL 15 MG PO CAPS: 15.0000 mg | ORAL_CAPSULE | ORAL | 1 refills | Status: DC

## 2019-11-02 NOTE — Progress Notes (Signed)
This visit occurred during the SARS-CoV-2 public health emergency.  Safety protocols were in place, including screening questions prior to the visit, additional usage of staff PPE, and extensive cleaning of exam room while observing appropriate contact time as indicated for disinfecting solutions.  Subjective:     Patient ID: Renee Li , female    DOB: [birth year] , 49 y.o.   MRN: [id number]   Chief Complaint  Patient presents with  . Weight Check    HPI  Presents today for HTN and weight check for phentermine. She is is down 10 lbs and is exercising. She is running and jogging getting ready for a 5K in September. She is struggling with her diet and is eating carbohydrates and sweets. She watches her sodium intake.    She is drinking 8 glasses of water daily. She is eating wild rice and enjoys a glass a wine daily due to cope with related  job stress. We discussed other ways to calm down such as mindfulness and meditation.  She reports that she is having some personal issues with her family that is also contributing to her stress. She is planning to have her niece move in soon. She is wanting the COVID vaccine and would prefers [facility] and I encouraged her to check with local pharmacies. We will perform Hep C screening today.  Wt Readings from Last 3 Encounters: 11/02/19 : 160 lb 6.4 oz (72.8 kg) 08/08/19 : 170 lb 6.4 oz (77.3 kg) 06/07/19 : 175 lb 6.4 oz (79.6 kg)    Past Medical History:  Diagnosis Date  . Anemia   . Depression   . Heart murmur    never caused any problems  . Hypertension   . Migraines      Family History  Problem Relation Age of Onset  . Cancer Mother        throat  . Stroke Maternal Grandmother   . Diabetes Maternal Grandmother   . Cancer Maternal Grandfather        prostate  . Heart disease Paternal Grandmother   . Colon cancer Neg Hx   . Rectal cancer Neg Hx   . Stomach cancer Neg Hx   . Esophageal cancer Neg Hx      Current  Outpatient Medications:  .  aspirin-acetaminophen-caffeine (EXCEDRIN MIGRAINE) 250-250-65 MG per tablet, Take 1 tablet by mouth every 6 (six) hours as needed for headache., Disp: , Rfl:  .  atenolol (TENORMIN) 50 MG tablet, Take 1 tablet (50 mg total) by mouth daily., Disp: 90 tablet, Rfl: 1 .  hydrochlorothiazide (HYDRODIURIL) 25 MG tablet, Take 1 tablet (25 mg total) by mouth daily., Disp: 90 tablet, Rfl: 1 .  hydrOXYzine (VISTARIL) 25 MG capsule, Take 1 capsule (25 mg total) by mouth 3 (three) times daily as needed. (Patient not taking: Reported on 08/08/2019), Disp: 30 capsule, Rfl: 1 .  phentermine 15 MG capsule, Take 1 capsule (15 mg total) by mouth every morning., Disp: 30 capsule, Rfl: 1   Allergies  Allergen Reactions  . Aleve [Naproxen] Hives  . Ibuprofen Hives     Review of Systems  Constitutional: Negative.   Cardiovascular: Negative.  Negative for palpitations and leg swelling.  Gastrointestinal: Negative.   Musculoskeletal: Negative.   Neurological: Negative.  Negative for syncope.  Psychiatric/Behavioral: Negative.  The patient is not nervous/anxious.      Today's Vitals   11/02/19 0845  BP: 120/84  Pulse: 60  Temp: 98.8 F (37.1 C)  TempSrc: Oral  Weight:  160 lb 6.4 oz (72.8 kg)  Height: 5' 2.8" (1.595 m)  PainSc: 0-No pain   Body mass index is 28.59 kg/m.   Objective:  Physical Exam Constitutional:      Appearance: Normal appearance. She is normal weight.  Neck:     Vascular: No carotid bruit.  Cardiovascular:     Rate and Rhythm: Normal rate and regular rhythm.     Pulses: Normal pulses.     Heart sounds: Normal heart sounds. No murmur heard.   Pulmonary:     Effort: Pulmonary effort is normal.     Breath sounds: Normal breath sounds.  Musculoskeletal:     Cervical back: Normal range of motion.  Skin:    General: Skin is warm and dry.     Capillary Refill: Capillary refill takes less than 2 seconds.  Neurological:     General: No focal deficit  present.     Mental Status: She is alert and oriented to person, place, and time.  Psychiatric:        Mood and Affect: Mood normal.        Behavior: Behavior normal.        Thought Content: Thought content normal.        Judgment: Judgment normal.         Assessment And Plan:     1. Essential hypertension Comments: Chronic, well controlled Continue with current medications - BMP8+eGFR  2. Elevated cholesterol Comments: Was elevated at last visit, with her current diet and recent exercise I would expect this to have improved. - Lipid panel  3. Overweight with body mass index (BMI) of 28 to 28.9 in adult Comments: She is now in the overweight category, she has lost another 10 lbs with a total of 15 lb weight loss since February Continue with regular exercise - phentermine 15 MG capsule; Take 1 capsule (15 mg total) by mouth every morning.  Dispense: 30 capsule; Refill: 1  4. Encounter for hepatitis C screening test for low risk patient  Will check Hepatitis C screening due to recent recommendations to screen all adults 18 years and older - Hepatitis C antibody     Patient was given opportunity to ask questions. Patient verbalized understanding of the plan and was able to repeat key elements of the plan. All questions were answered to their satisfaction.  [name], FNP   I have reviewed this encounter including the documentation in this note and/or discussed this patient with the provider. I am certifying that I agree with the content of this note as the primary care nurse practitioner.  Teola Bradley, FNP, have reviewed all documentation for this visit. The documentation on 11/02/19 for the exam, diagnosis, procedures, and orders are all accurate and complete.  THE PATIENT IS ENCOURAGED TO PRACTICE SOCIAL DISTANCING DUE TO THE COVID-19 PANDEMIC.

## 2019-11-03 LAB — LIPID PANEL
Chol/HDL Ratio: 3.8 ratio (ref 0.0–4.4)
Cholesterol, Total: 164 mg/dL (ref 100–199)
HDL: 43 mg/dL (ref >39)
LDL Chol Calc (NIH): 108 mg/dL — ABNORMAL HIGH (ref 0–99)
Triglycerides: 64 mg/dL (ref 0–149)
VLDL Cholesterol Cal: 13 mg/dL (ref 5–40)

## 2019-11-03 LAB — BMP8+EGFR
BUN/Creatinine Ratio: 18 (ref 9–23)
BUN: 19 mg/dL (ref 6–24)
CO2: 26 mmol/L (ref 20–29)
Calcium: 9.8 mg/dL (ref 8.7–10.2)
Chloride: 101 mmol/L (ref 96–106)
Creatinine, Ser: 1.06 mg/dL — ABNORMAL HIGH (ref 0.57–1.00)
GFR calc Af Amer: 70 mL/min/{1.73_m2} (ref >59)
GFR calc non Af Amer: 61 mL/min/{1.73_m2} (ref >59)
Glucose: 95 mg/dL (ref 65–99)
Potassium: 4.1 mmol/L (ref 3.5–5.2)
Sodium: 142 mmol/L (ref 134–144)

## 2019-11-03 LAB — HEPATITIS C ANTIBODY: Hep C Virus Ab: <0.1 s/co ratio (ref 0.0–0.9)

## 2019-11-03 NOTE — Telephone Encounter (Signed)
Phentermine refill. 

## 2020-01-14 ENCOUNTER — Other Ambulatory Visit: Payer: Self-pay | Admitting: Nurse Practitioner

## 2020-01-14 DIAGNOSIS — I10 Essential (primary) hypertension: Secondary | ICD-10-CM

## 2020-01-16 ENCOUNTER — Ambulatory Visit: Payer: 59 | Admitting: Nurse Practitioner

## 2020-01-17 ENCOUNTER — Encounter: Payer: Self-pay | Admitting: Nurse Practitioner

## 2020-01-21 ENCOUNTER — Ambulatory Visit (HOSPITAL_COMMUNITY)
Admission: EM | Admit: 2020-01-21 | Discharge: 2020-01-21 | Disposition: A | Discharge status: Home / Self Care | Payer: 59 | Attending: Family Medicine | Admitting: Family Medicine

## 2020-01-21 ENCOUNTER — Encounter (HOSPITAL_COMMUNITY): Payer: Self-pay

## 2020-01-21 ENCOUNTER — Other Ambulatory Visit: Payer: Self-pay

## 2020-01-21 DIAGNOSIS — M62830 Muscle spasm of back: Secondary | ICD-10-CM | POA: Diagnosis not present

## 2020-01-21 LAB — POCT URINALYSIS DIPSTICK, ED / UC
Bilirubin Urine: NEGATIVE
Glucose, UA: NEGATIVE mg/dL
Ketones, ur: NEGATIVE mg/dL
Nitrite: NEGATIVE
Protein, ur: NEGATIVE mg/dL
Specific Gravity, Urine: 1.025 (ref 1.005–1.030)
Urobilinogen, UA: 0.2 mg/dL (ref 0.0–1.0)
pH: 6 (ref 5.0–8.0)

## 2020-01-21 MED ORDER — METHYLPREDNISOLONE SODIUM SUCC 125 MG IJ SOLR
INTRAMUSCULAR | Status: AC
  Filled 2020-01-21: qty 2

## 2020-01-21 MED ORDER — DIAZEPAM 5 MG PO TABS: 5.0000 mg | ORAL_TABLET | Freq: Two times a day (BID) | ORAL | 0 refills | Status: DC | PRN

## 2020-01-21 MED ORDER — METHYLPREDNISOLONE SODIUM SUCC 125 MG IJ SOLR
125.0000 mg | Freq: Once | INTRAMUSCULAR | Status: AC
  Administered 2020-01-21: 125 mg via INTRAMUSCULAR

## 2020-01-21 NOTE — ED Triage Notes (Signed)
Pt present muscle spasm in her lower back, symptoms started on Wednesday. Pt states that its hard to get up out of bed.

## 2020-01-21 NOTE — ED Provider Notes (Signed)
[facility]   [id number] 01/21/20 Arrival Time: 1962  ASSESSMENT & PLAN:  1. Lumbar paraspinal muscle spasm     Begin: Meds ordered this encounter  Medications  . methylPREDNISolone sodium succinate (SOLU-MEDROL) 125 mg/2 mL injection 125 mg  . diazepam (VALIUM) 5 MG tablet    Sig: Take 1 tablet (5 mg total) by mouth every 12 (twelve) hours as needed for muscle spasms.    Dispense:  10 tablet    Refill:  0    No indication for imaging of back at this time given no trauma and normal neurological exam. Discussed.   Medication sedation precautions given. Encourage ROM/movement as tolerated.  Recommend:  Follow-up Information    [facility].   Why: If worsening or failing to improve as anticipated. Contact information: [address] [place] [phone]              Reviewed expectations re: course of current medical issues. Questions answered. Outlined signs and symptoms indicating need for more acute intervention. Patient verbalized understanding. After Visit Summary given.   SUBJECTIVE: History from: patient.  Renee Li is a 49 y.o. female who presents with complaint of R lumbar back spasm; 3-4 days; no trauma; h/o similar approx 10 years ago. No extremity sensation changes or weakness. Normal bowel/bladder habits. Rx flexeril without much relief. Tylenol with minimal help.   OBJECTIVE:  Vitals:   01/21/20 1055  BP: (!) 139/93  Pulse: 74  Resp: 18  Temp: 98.1 F (36.7 C)  TempSrc: Oral  SpO2: 99%    General appearance: alert; no distress but appears uncomfortable; prefers to stay in wheelchair HEENT: [place]; AT Lungs: unlabored respirations; speaks full sentences without difficulty Abdomen: soft Back: moderate TTP over R lower back paraspinal musculature; without midline TTP Extremities: without edema; symmetrical without gross deformities; normal ROM of bilateral  LE Skin: warm and dry Neurologic: normal gait; normal sensation and strength of bilateral LE Psychological: alert and cooperative; normal mood and affect  Labs: Results for orders placed or performed during the hospital encounter of 01/21/20  POCT Urinalysis Dipstick (ED/UC)  Result Value Ref Range   Glucose, UA NEGATIVE NEGATIVE mg/dL   Bilirubin Urine NEGATIVE NEGATIVE   Ketones, ur NEGATIVE NEGATIVE mg/dL   Specific Gravity, Urine 1.025 1.005 - 1.030   Hgb urine dipstick SMALL (A) NEGATIVE   pH 6.0 5.0 - 8.0   Protein, ur NEGATIVE NEGATIVE mg/dL   Urobilinogen, UA 0.2 0.0 - 1.0 mg/dL   Nitrite NEGATIVE NEGATIVE   Leukocytes,Ua SMALL (A) NEGATIVE   Labs Reviewed  POCT URINALYSIS DIPSTICK, ED / UC - Abnormal; Notable for the following components:      Result Value   Hgb urine dipstick SMALL (*)    Leukocytes,Ua SMALL (*)    All other components within normal limits  URINE CULTURE     Allergies  Allergen Reactions  . Aleve [Naproxen] Hives  . Ibuprofen Hives    Past Medical History:  Diagnosis Date  . Anemia   . Depression   . Heart murmur    never caused any problems  . Hypertension   . Migraines    Social History   Socioeconomic History  . Marital status: Divorced    Spouse name: Not on file  . Number of children: 1  . Years of education: Not on file  . Highest education level: Not on file  Occupational History  . Occupation: Engineer, maintenance  Tobacco Use  . Smoking status: Never Smoker  . Smokeless tobacco: Never Used  Vaping Use  . Vaping Use: Never used  Substance and Sexual Activity  . Alcohol use: Yes    Alcohol/week: 1.0 standard drink    Types: 1 Glasses of wine per week  . Drug use: No  . Sexual activity: Not Currently    Birth control/protection: I.U.D.    Comment: Mirena ID  Other Topics Concern  . Not on file  Social History Narrative  . Not on file   Social Determinants of Health   Financial Resource Strain:   . Difficulty of  Paying Living Expenses: Not on file  Food Insecurity:   . Worried About Charity fundraiser in the Last Year: Not on file  . Ran Out of Food in the Last Year: Not on file  Transportation Needs:   . Lack of Transportation (Medical): Not on file  . Lack of Transportation (Non-Medical): Not on file  Physical Activity:   . Days of Exercise per Week: Not on file  . Minutes of Exercise per Session: Not on file  Stress:   . Feeling of Stress : Not on file  Social Connections:   . Frequency of Communication with Friends and Family: Not on file  . Frequency of Social Gatherings with Friends and Family: Not on file  . Attends Religious Services: Not on file  . Active Member of Clubs or Organizations: Not on file  . Attends Archivist Meetings: Not on file  . Marital Status: Not on file  Intimate Partner Violence:   . Fear of Current or Ex-Partner: Not on file  . Emotionally Abused: Not on file  . Physically Abused: Not on file  . Sexually Abused: Not on file   Family History  Problem Relation Age of Onset  . Cancer Mother        throat  . Stroke Maternal Grandmother   . Diabetes Maternal Grandmother   . Cancer Maternal Grandfather        prostate  . Heart disease Paternal Grandmother   . Colon cancer Neg Hx   . Rectal cancer Neg Hx   . Stomach cancer Neg Hx   . Esophageal cancer Neg Hx    Past Surgical History:  Procedure Laterality Date  . APPENDECTOMY    . WISDOM TOOTH EXTRACTION       Vanessa Kick, MD 01/21/20 [phone]

## 2020-01-21 NOTE — Discharge Instructions (Addendum)
Be aware, you have been prescribed medications that may cause drowsiness. Do not combine with alcohol or other illicit drugs. Please do not drive, operate heavy machinery, or take part in activities that require making important decisions while on this medication as your judgement may be clouded.

## 2020-01-22 LAB — URINE CULTURE

## 2020-01-23 ENCOUNTER — Other Ambulatory Visit: Payer: Self-pay

## 2020-01-23 ENCOUNTER — Ambulatory Visit: Payer: 59 | Admitting: Nurse Practitioner

## 2020-01-23 ENCOUNTER — Encounter: Payer: Self-pay | Admitting: Nurse Practitioner

## 2020-01-23 VITALS — BP 122/86 | HR 69 | Temp 98.5°F | Ht 62.8 in | Wt 163.4 lb

## 2020-01-23 DIAGNOSIS — E663 Overweight: Secondary | ICD-10-CM | POA: Diagnosis not present

## 2020-01-23 DIAGNOSIS — M62838 Other muscle spasm: Secondary | ICD-10-CM

## 2020-01-23 DIAGNOSIS — Z6828 Body mass index (BMI) 28.0-28.9, adult: Secondary | ICD-10-CM | POA: Diagnosis not present

## 2020-01-23 DIAGNOSIS — I10 Essential (primary) hypertension: Secondary | ICD-10-CM | POA: Diagnosis not present

## 2020-01-23 MED ORDER — PHENTERMINE HCL 15 MG PO CAPS: 15.0000 mg | ORAL_CAPSULE | ORAL | 1 refills | Status: DC

## 2020-01-23 NOTE — Patient Instructions (Signed)

## 2020-01-23 NOTE — Progress Notes (Signed)
Rutherford Nail as a scribe for [name], FNP.,have documented all relevant documentation on the behalf of [name], FNP,as directed by  [name], FNP while in the presence of [name], [place]. This visit occurred during the SARS-CoV-2 public health emergency.  Safety protocols were in place, including screening questions prior to the visit, additional usage of staff PPE, and extensive cleaning of exam room while observing appropriate contact time as indicated for disinfecting solutions.  Subjective:     Patient ID: Renee Li , female    DOB: 03-Nov-1970 , 49 y.o.   MRN: [id number]   Chief Complaint  Patient presents with  . Weight Check    HPI  Here for weight check  Wt Readings from Last 3 Encounters: 01/23/20 : 163 lb 6.4 oz (74.1 kg) 11/02/19 : 160 lb 6.4 oz (72.8 kg) 08/08/19 : 170 lb 6.4 oz (77.3 kg)  Her niece has come to live with her for a little while after she attempted to commit suicide. So she has been under more stress and having to work around having an 51 year old living with her.  She is planning to take vacation at the end of the month.  She has also received the covid vaccines.    She has not been taking her phentermine daily due to taking Valium.  She was also given a prednisone injection at the Urgent care on Saturday.  She is to follow up sports medicine if she is not better.      Past Medical History:  Diagnosis Date  . Anemia   . Depression   . Heart murmur    never caused any problems  . Hypertension   . Migraines      Family History  Problem Relation Age of Onset  . Cancer Mother        throat  . Stroke Maternal Grandmother   . Diabetes Maternal Grandmother   . Cancer Maternal Grandfather        prostate  . Heart disease Paternal Grandmother   . Colon cancer Neg Hx   . Rectal cancer Neg Hx   . Stomach cancer Neg Hx   . Esophageal cancer Neg Hx      Current Outpatient Medications:  .   aspirin-acetaminophen-caffeine (EXCEDRIN MIGRAINE) 250-250-65 MG per tablet, Take 1 tablet by mouth every 6 (six) hours as needed for headache., Disp: , Rfl:  .  atenolol (TENORMIN) 50 MG tablet, Take 1 tablet (50 mg total) by mouth daily., Disp: 90 tablet, Rfl: 1 .  diazepam (VALIUM) 5 MG tablet, Take 1 tablet (5 mg total) by mouth every 12 (twelve) hours as needed for muscle spasms., Disp: 10 tablet, Rfl: 0 .  hydrochlorothiazide (HYDRODIURIL) 25 MG tablet, Take 1 tablet by mouth once daily, Disp: 30 tablet, Rfl: 0 .  hydrOXYzine (VISTARIL) 25 MG capsule, Take 1 capsule (25 mg total) by mouth 3 (three) times daily as needed., Disp: 30 capsule, Rfl: 1 .  phentermine 15 MG capsule, Take 1 capsule (15 mg total) by mouth every morning., Disp: 30 capsule, Rfl: 1   Allergies  Allergen Reactions  . Aleve [Naproxen] Hives  . Ibuprofen Hives     Review of Systems  Constitutional: Negative.  Negative for fatigue.  HENT: Negative.   Respiratory: Negative.  Negative for cough.   Cardiovascular: Negative.  Negative for chest pain, palpitations and leg swelling.  Endocrine: Negative for polydipsia, polyphagia and polyuria.  Skin: Negative.   Neurological: Negative for dizziness  and headaches.  Psychiatric/Behavioral: Negative.      Today's Vitals   01/23/20 1046  BP: 122/86  Pulse: 69  Temp: 98.5 F (36.9 C)  TempSrc: Oral  Weight: 163 lb 6.4 oz (74.1 kg)  Height: 5' 2.8" (1.595 m)  PainSc: 4   PainLoc: Back   Body mass index is 29.13 kg/m.   Objective:  Physical Exam Vitals reviewed.  Constitutional:      General: She is not in acute distress.    Appearance: Normal appearance.  Cardiovascular:     Rate and Rhythm: Normal rate and regular rhythm.     Pulses: Normal pulses.     Heart sounds: Normal heart sounds. No murmur heard.   Pulmonary:     Effort: Pulmonary effort is normal. No respiratory distress.     Breath sounds: Normal breath sounds. No wheezing.  Neurological:      General: No focal deficit present.     Mental Status: She is alert and oriented to person, place, and time.     Cranial Nerves: No cranial nerve deficit.  Psychiatric:        Mood and Affect: Mood normal.        Behavior: Behavior normal.        Thought Content: Thought content normal.        Judgment: Judgment normal.         Assessment And Plan:     1. Essential hypertension  Chronic, blood pressure is under good control  Continue with current medications  2. Muscle spasm  She is to continue with the medications given to her at the Urgent care if not better she is to follow up with sports medicine  3. Overweight with body mass index (BMI) of 28 to 28.9 in adult  weight has increased slightly under more stress which could be contributing  Continue with regular exercise, phentermine and healthy diet - phentermine 15 MG capsule; Take 1 capsule (15 mg total) by mouth every morning.  Dispense: 30 capsule; Refill: 1   Patient was given opportunity to ask questions. Patient verbalized understanding of the plan and was able to repeat key elements of the plan. All questions were answered to their satisfaction.     Teola Bradley, FNP, have reviewed all documentation for this visit. The documentation on 01/23/20 for the exam, diagnosis, procedures, and orders are all accurate and complete.  THE PATIENT IS ENCOURAGED TO PRACTICE SOCIAL DISTANCING DUE TO THE COVID-19 PANDEMIC.

## 2020-02-11 ENCOUNTER — Other Ambulatory Visit: Payer: Self-pay | Admitting: Nurse Practitioner

## 2020-02-11 DIAGNOSIS — I10 Essential (primary) hypertension: Secondary | ICD-10-CM

## 2020-02-20 ENCOUNTER — Other Ambulatory Visit: Payer: Self-pay | Admitting: Nurse Practitioner

## 2020-02-20 DIAGNOSIS — I10 Essential (primary) hypertension: Secondary | ICD-10-CM

## 2020-03-24 ENCOUNTER — Other Ambulatory Visit: Payer: Self-pay | Admitting: Nurse Practitioner

## 2020-03-24 DIAGNOSIS — I10 Essential (primary) hypertension: Secondary | ICD-10-CM

## 2020-06-12 ENCOUNTER — Encounter: Payer: 59 | Admitting: Nurse Practitioner

## 2020-07-17 ENCOUNTER — Other Ambulatory Visit: Payer: Self-pay | Admitting: Nurse Practitioner

## 2020-07-17 ENCOUNTER — Telehealth: Payer: Self-pay

## 2020-07-17 DIAGNOSIS — I10 Essential (primary) hypertension: Secondary | ICD-10-CM

## 2020-07-17 NOTE — Telephone Encounter (Signed)
lvm for patient to call to schedule appt

## 2020-07-18 ENCOUNTER — Encounter: Payer: Self-pay | Admitting: Nurse Practitioner

## 2020-07-18 ENCOUNTER — Other Ambulatory Visit: Payer: Self-pay

## 2020-07-18 ENCOUNTER — Ambulatory Visit (INDEPENDENT_AMBULATORY_CARE_PROVIDER_SITE_OTHER): Payer: Self-pay | Admitting: Nurse Practitioner

## 2020-07-18 VITALS — BP 120/84 | HR 70 | Temp 97.8°F | Ht 62.8 in | Wt 172.6 lb

## 2020-07-18 DIAGNOSIS — Z6830 Body mass index (BMI) 30.0-30.9, adult: Secondary | ICD-10-CM

## 2020-07-18 DIAGNOSIS — E6609 Other obesity due to excess calories: Secondary | ICD-10-CM

## 2020-07-18 DIAGNOSIS — I10 Essential (primary) hypertension: Secondary | ICD-10-CM

## 2020-07-18 LAB — BMP8+EGFR
BUN/Creatinine Ratio: 13 (ref 9–23)
BUN: 12 mg/dL (ref 6–24)
CO2: 26 mmol/L (ref 20–29)
Calcium: 9.8 mg/dL (ref 8.7–10.2)
Chloride: 100 mmol/L (ref 96–106)
Creatinine, Ser: 0.95 mg/dL (ref 0.57–1.00)
Glucose: 93 mg/dL (ref 65–99)
Potassium: 3.7 mmol/L (ref 3.5–5.2)
Sodium: 143 mmol/L (ref 134–144)
eGFR: 72 mL/min/{1.73_m2} (ref >59)

## 2020-07-18 MED ORDER — HYDROCHLOROTHIAZIDE 25 MG PO TABS: 25.0000 mg | ORAL_TABLET | Freq: Every day | ORAL | 1 refills | Status: DC

## 2020-07-18 MED ORDER — ATENOLOL 50 MG PO TABS: 50.0000 mg | ORAL_TABLET | Freq: Every day | ORAL | 1 refills | Status: DC

## 2020-07-18 NOTE — Progress Notes (Signed)
I,Yamilka Roman [employer] as a Education administrator for [employer], FNP.,have documented all relevant documentation on the behalf of [name], FNP,as directed by  [name], FNP while in the presence of [name], [place]. This visit occurred during the SARS-CoV-2 public health emergency.  Safety protocols were in place, including screening questions prior to the visit, additional usage of staff PPE, and extensive cleaning of exam room while observing appropriate contact time as indicated for disinfecting solutions.  Subjective:     Patient ID: Renee Li , female    DOB: 1971/03/18 , 50 y.o.   MRN: [id number]   Chief Complaint  Patient presents with  . Hypertension    HPI  Patient presents today for a blood pressure f/u. She is no longer working for the last 2-3 weeks. Her little sister is living with her now as she is going through a divorce.   Wt Readings from Last 3 Encounters: 07/18/20 : 172 lb 9.6 oz (78.3 kg) 01/23/20 : 163 lb 6.4 oz (74.1 kg) 11/02/19 : 160 lb 6.4 oz (72.8 kg)  Hypertension This is a chronic problem. The current episode started more than 1 year ago. The problem is controlled. Pertinent negatives include no anxiety. There are no associated agents to hypertension. Risk factors for coronary artery disease include sedentary lifestyle and obesity.     Past Medical History:  Diagnosis Date  . Anemia   . Depression   . Heart murmur    never caused any problems  . Hypertension   . Migraines      Family History  Problem Relation Age of Onset  . Cancer Mother        throat  . Stroke Maternal Grandmother   . Diabetes Maternal Grandmother   . Cancer Maternal Grandfather        prostate  . Heart disease Paternal Grandmother   . Colon cancer Neg Hx   . Rectal cancer Neg Hx   . Stomach cancer Neg Hx   . Esophageal cancer Neg Hx      Current Outpatient Medications:  .  aspirin-acetaminophen-caffeine (EXCEDRIN MIGRAINE) 250-250-65 MG per tablet,  Take 1 tablet by mouth every 6 (six) hours as needed for headache., Disp: , Rfl:  .  diazepam (VALIUM) 5 MG tablet, Take 1 tablet (5 mg total) by mouth every 12 (twelve) hours as needed for muscle spasms., Disp: 10 tablet, Rfl: 0 .  hydrOXYzine (VISTARIL) 25 MG capsule, Take 1 capsule (25 mg total) by mouth 3 (three) times daily as needed., Disp: 30 capsule, Rfl: 1 .  phentermine 15 MG capsule, Take 1 capsule (15 mg total) by mouth every morning., Disp: 30 capsule, Rfl: 1 .  atenolol (TENORMIN) 50 MG tablet, Take 1 tablet (50 mg total) by mouth daily., Disp: 90 tablet, Rfl: 1 .  hydrochlorothiazide (HYDRODIURIL) 25 MG tablet, Take 1 tablet (25 mg total) by mouth daily., Disp: 90 tablet, Rfl: 1   Allergies  Allergen Reactions  . Aleve [Naproxen] Hives  . Ibuprofen Hives     Review of Systems  Constitutional: Negative.   Respiratory: Negative.   Cardiovascular: Negative.   Musculoskeletal: Negative.   Skin: Negative.   Psychiatric/Behavioral: Negative.      Today's Vitals   07/18/20 0943  BP: 120/84  Pulse: 70  Temp: 97.8 F (36.6 C)  TempSrc: Oral  Weight: 172 lb 9.6 oz (78.3 kg)  Height: 5' 2.8" (1.595 m)  PainSc: 0-No pain   Body mass index is 30.77 kg/m.  Objective:  Physical Exam Vitals reviewed.  Constitutional:      General: She is not in acute distress.    Appearance: Normal appearance.  Cardiovascular:     Rate and Rhythm: Normal rate and regular rhythm.     Pulses: Normal pulses.     Heart sounds: Normal heart sounds. No murmur heard.   Pulmonary:     Effort: Pulmonary effort is normal. No respiratory distress.     Breath sounds: Normal breath sounds. No wheezing.  Neurological:     General: No focal deficit present.     Mental Status: She is alert and oriented to person, place, and time.     Cranial Nerves: No cranial nerve deficit.  Psychiatric:        Mood and Affect: Mood normal.        Behavior: Behavior normal.        Thought Content: Thought  content normal.        Judgment: Judgment normal.         Assessment And Plan:     1. Essential hypertension . B/P is controlled.  Marland Kitchen BMP ordered to check renal function.  . The importance of regular exercise and dietary modification was stressed to the patient.  - hydrochlorothiazide (HYDRODIURIL) 25 MG tablet; Take 1 tablet (25 mg total) by mouth daily.  Dispense: 90 tablet; Refill: 1 - atenolol (TENORMIN) 50 MG tablet; Take 1 tablet (50 mg total) by mouth daily.  Dispense: 90 tablet; Refill: 1 - BMP8+eGFR  2. Class 1 obesity due to excess calories without serious comorbidity with body mass index (BMI) of 30.0 to 30.9 in adult  Chronic  Discussed healthy diet and regular exercise options   Continue with your regular exercise.    Patient was given opportunity to ask questions. Patient verbalized understanding of the plan and was able to repeat key elements of the plan. All questions were answered to their satisfaction.  [name], FNP   I, [name], FNP, have reviewed all documentation for this visit. The documentation on 07/18/20 for the exam, diagnosis, procedures, and orders are all accurate and complete.   IF YOU HAVE BEEN REFERRED TO A SPECIALIST, IT MAY TAKE 1-2 WEEKS TO SCHEDULE/PROCESS THE REFERRAL. IF YOU HAVE NOT HEARD FROM US/SPECIALIST IN TWO WEEKS, PLEASE GIVE [place] A CALL AT [phone].   THE PATIENT IS ENCOURAGED TO PRACTICE SOCIAL DISTANCING DUE TO THE COVID-19 PANDEMIC.

## 2020-07-18 NOTE — Patient Instructions (Signed)

## 2020-12-27 ENCOUNTER — Other Ambulatory Visit: Payer: Self-pay | Admitting: Nurse Practitioner

## 2020-12-27 DIAGNOSIS — I10 Essential (primary) hypertension: Secondary | ICD-10-CM

## 2021-01-20 ENCOUNTER — Other Ambulatory Visit: Payer: Self-pay | Admitting: Nurse Practitioner

## 2021-01-20 DIAGNOSIS — I10 Essential (primary) hypertension: Secondary | ICD-10-CM

## 2021-01-22 ENCOUNTER — Encounter: Payer: Self-pay | Admitting: Nurse Practitioner

## 2021-01-30 ENCOUNTER — Ambulatory Visit (INDEPENDENT_AMBULATORY_CARE_PROVIDER_SITE_OTHER): Payer: Commercial Managed Care - PPO | Admitting: Nurse Practitioner

## 2021-01-30 ENCOUNTER — Encounter: Payer: Self-pay | Admitting: Nurse Practitioner

## 2021-01-30 ENCOUNTER — Other Ambulatory Visit: Payer: Self-pay

## 2021-01-30 VITALS — BP 122/82 | HR 86 | Temp 98.7°F | Ht 62.0 in | Wt 171.2 lb

## 2021-01-30 DIAGNOSIS — R430 Anosmia: Secondary | ICD-10-CM | POA: Diagnosis not present

## 2021-01-30 DIAGNOSIS — I10 Essential (primary) hypertension: Secondary | ICD-10-CM | POA: Diagnosis not present

## 2021-01-30 DIAGNOSIS — E7800 Pure hypercholesterolemia, unspecified: Secondary | ICD-10-CM

## 2021-01-30 MED ORDER — FLUTICASONE PROPIONATE 50 MCG/ACT NA SUSP: 2.0000 | Freq: Every day | NASAL | 2 refills | Status: DC

## 2021-01-30 NOTE — Patient Instructions (Signed)

## 2021-01-30 NOTE — Progress Notes (Signed)
I, [place],acting as a Education administrator for [employer], FNP.,have documented all relevant documentation on the behalf of [name], FNP,as directed by  [name], FNP while in the presence of [name], [place].   This visit occurred during the SARS-CoV-2 public health emergency.  Safety protocols were in place, including screening questions prior to the visit, additional usage of staff PPE, and extensive cleaning of exam room while observing appropriate contact time as indicated for disinfecting solutions.  Subjective:     Patient ID: Renee Li , female    DOB: [birth year] , 50 y.o.   MRN: [id number]   Chief Complaint  Patient presents with   Hypertension     HPI  Pt here for BPC. She is walking 10,000 steps daily - she is working for the [employer]) graduated in July. She is based in [place].  She is eating out more since her new job.   The patient said she has been unable to smell for a few months.  She does take an antihistamine.  She does not take a nasal spray.  Unable to smell cookies last night. Denies headache or stuffiness.   Hypertension This is a chronic problem. The current episode started more than 1 year ago. The problem is controlled. Pertinent negatives include no anxiety. There are no associated agents to hypertension. Risk factors for coronary artery disease include sedentary lifestyle and obesity. Past treatments include diuretics. There are no compliance problems.  There is no history of angina. There is no history of chronic renal disease.    Past Medical History:  Diagnosis Date   Anemia    Depression    Heart murmur    never caused any problems   Hypertension    Migraines      Family History  Problem Relation Age of Onset   Cancer Mother        throat   Stroke Maternal Grandmother    Diabetes Maternal Grandmother    Cancer Maternal Grandfather        prostate   Heart disease Paternal Grandmother    Colon cancer Neg Hx     Rectal cancer Neg Hx    Stomach cancer Neg Hx    Esophageal cancer Neg Hx      Current Outpatient Medications:    aspirin-acetaminophen-caffeine (EXCEDRIN MIGRAINE) 250-250-65 MG per tablet, Take 1 tablet by mouth every 6 (six) hours as needed for headache., Disp: , Rfl:    atenolol (TENORMIN) 50 MG tablet, Take 1 tablet by mouth once daily, Disp: 90 tablet, Rfl: 0   diazepam (VALIUM) 5 MG tablet, Take 1 tablet (5 mg total) by mouth every 12 (twelve) hours as needed for muscle spasms., Disp: 10 tablet, Rfl: 0   fluticasone (FLONASE) 50 MCG/ACT nasal spray, Place 2 sprays into both nostrils daily., Disp: 18.2 mL, Rfl: 2   hydrochlorothiazide (HYDRODIURIL) 25 MG tablet, Take 1 tablet by mouth once daily, Disp: 90 tablet, Rfl: 0   Allergies  Allergen Reactions   Aleve [Naproxen] Hives   Ibuprofen Hives     Review of Systems  Constitutional: Negative.   Respiratory: Negative.    Cardiovascular: Negative.   Gastrointestinal: Negative.   Neurological: Negative.   Psychiatric/Behavioral: Negative.    All other systems reviewed and are negative.   Today's Vitals   01/30/21 0938  BP: 122/82  Pulse: 86  Temp: 98.7 F (37.1 C)  Weight: 171 lb 3.2 oz (77.7 kg)  Height: '5\' 2"'  (1.575 m)  Body mass index is 31.31 kg/m.   Objective:  Physical Exam Vitals reviewed.  Constitutional:      General: She is not in acute distress.    Appearance: Normal appearance. She is obese.  HENT:     Nose:     Right Turbinates: Enlarged (more than left).     Left Turbinates: Enlarged.  Cardiovascular:     Rate and Rhythm: Normal rate and regular rhythm.     Pulses: Normal pulses.     Heart sounds: Normal heart sounds. No murmur heard. Pulmonary:     Effort: Pulmonary effort is normal. No respiratory distress.     Breath sounds: Normal breath sounds. No wheezing.  Neurological:     General: No focal deficit present.     Mental Status: She is alert and oriented to person, place, and time.      Cranial Nerves: No cranial nerve deficit.  Psychiatric:        Mood and Affect: Mood normal.        Behavior: Behavior normal.        Thought Content: Thought content normal.        Judgment: Judgment normal.        Assessment And Plan:     1. Essential hypertension Comments: Blood pressure is well controlled Continue current medications - BMP8+eGFR  2. Elevated cholesterol Comments: Improving at last visit, continue to limit intake of fried and fatty foods - Lipid panel  3. Anosmia Comments: Turbinates are enlarged right more than left  Flonase nasal spray sent to pharmacy - fluticasone (FLONASE) 50 MCG/ACT nasal spray; Place 2 sprays into both nostrils daily.  Dispense: 18.2 mL; Refill: 2    Patient was given opportunity to ask questions. Patient verbalized understanding of the plan and was able to repeat key elements of the plan. All questions were answered to their satisfaction.  [name], FNP   I, [name], FNP, have reviewed all documentation for this visit. The documentation on 01/30/21 for the exam, diagnosis, procedures, and orders are all accurate and complete.   IF YOU HAVE BEEN REFERRED TO A SPECIALIST, IT MAY TAKE 1-2 WEEKS TO SCHEDULE/PROCESS THE REFERRAL. IF YOU HAVE NOT HEARD FROM US/SPECIALIST IN TWO WEEKS, PLEASE GIVE [place] A CALL AT [phone].   THE PATIENT IS ENCOURAGED TO PRACTICE SOCIAL DISTANCING DUE TO THE COVID-19 PANDEMIC.

## 2021-01-31 LAB — LIPID PANEL
Chol/HDL Ratio: 3.7 ratio (ref 0.0–4.4)
Cholesterol, Total: 196 mg/dL (ref 100–199)
HDL: 53 mg/dL (ref >39)
LDL Chol Calc (NIH): 128 mg/dL — ABNORMAL HIGH (ref 0–99)
Triglycerides: 81 mg/dL (ref 0–149)
VLDL Cholesterol Cal: 15 mg/dL (ref 5–40)

## 2021-01-31 LAB — BMP8+EGFR
BUN/Creatinine Ratio: 15 (ref 9–23)
BUN: 16 mg/dL (ref 6–24)
CO2: 26 mmol/L (ref 20–29)
Calcium: 10.4 mg/dL — ABNORMAL HIGH (ref 8.7–10.2)
Chloride: 104 mmol/L (ref 96–106)
Creatinine, Ser: 1.06 mg/dL — ABNORMAL HIGH (ref 0.57–1.00)
Glucose: 98 mg/dL (ref 70–99)
Potassium: 4.6 mmol/L (ref 3.5–5.2)
Sodium: 146 mmol/L — ABNORMAL HIGH (ref 134–144)
eGFR: 63 mL/min/{1.73_m2} (ref >59)

## 2021-03-17 ENCOUNTER — Encounter (HOSPITAL_BASED_OUTPATIENT_CLINIC_OR_DEPARTMENT_OTHER): Payer: Self-pay | Admitting: *Deleted

## 2021-03-17 ENCOUNTER — Emergency Department (HOSPITAL_BASED_OUTPATIENT_CLINIC_OR_DEPARTMENT_OTHER): Payer: Commercial Managed Care - PPO

## 2021-03-17 ENCOUNTER — Other Ambulatory Visit: Payer: Self-pay

## 2021-03-17 ENCOUNTER — Emergency Department (HOSPITAL_BASED_OUTPATIENT_CLINIC_OR_DEPARTMENT_OTHER)
Admission: EM | Admit: 2021-03-17 | Discharge: 2021-03-17 | Disposition: A | Discharge status: Home / Self Care | Payer: Commercial Managed Care - PPO | Attending: Emergency Medicine | Admitting: Emergency Medicine

## 2021-03-17 DIAGNOSIS — S59911A Unspecified injury of right forearm, initial encounter: Secondary | ICD-10-CM | POA: Diagnosis present

## 2021-03-17 DIAGNOSIS — Z7982 Long term (current) use of aspirin: Secondary | ICD-10-CM | POA: Diagnosis not present

## 2021-03-17 DIAGNOSIS — S0990XA Unspecified injury of head, initial encounter: Secondary | ICD-10-CM | POA: Insufficient documentation

## 2021-03-17 DIAGNOSIS — I10 Essential (primary) hypertension: Secondary | ICD-10-CM | POA: Insufficient documentation

## 2021-03-17 DIAGNOSIS — S52124A Nondisplaced fracture of head of right radius, initial encounter for closed fracture: Secondary | ICD-10-CM | POA: Insufficient documentation

## 2021-03-17 DIAGNOSIS — S29001A Unspecified injury of muscle and tendon of front wall of thorax, initial encounter: Secondary | ICD-10-CM | POA: Insufficient documentation

## 2021-03-17 DIAGNOSIS — M5430 Sciatica, unspecified side: Secondary | ICD-10-CM | POA: Insufficient documentation

## 2021-03-17 LAB — COMPREHENSIVE METABOLIC PANEL
ALT: 15 U/L (ref 0–44)
AST: 19 U/L (ref 15–41)
Albumin: 4.2 g/dL (ref 3.5–5.0)
Alkaline Phosphatase: 78 U/L (ref 38–126)
Anion gap: 7 (ref 5–15)
BUN: 21 mg/dL — ABNORMAL HIGH (ref 6–20)
CO2: 29 mmol/L (ref 22–32)
Calcium: 9.3 mg/dL (ref 8.9–10.3)
Chloride: 104 mmol/L (ref 98–111)
Creatinine, Ser: 0.92 mg/dL (ref 0.44–1.00)
GFR, Estimated: >60 mL/min (ref >60)
Glucose, Bld: 127 mg/dL — ABNORMAL HIGH (ref 70–99)
Potassium: 3.5 mmol/L (ref 3.5–5.1)
Sodium: 140 mmol/L (ref 135–145)
Total Bilirubin: 0.3 mg/dL (ref 0.3–1.2)
Total Protein: 7.5 g/dL (ref 6.5–8.1)

## 2021-03-17 LAB — CBC WITH DIFFERENTIAL/PLATELET
Abs Immature Granulocytes: 0.02 10*3/uL (ref 0.00–0.07)
Basophils Absolute: 0 10*3/uL (ref 0.0–0.1)
Basophils Relative: 0 %
Eosinophils Absolute: 0 10*3/uL (ref 0.0–0.5)
Eosinophils Relative: 1 %
HCT: 45.9 % (ref 36.0–46.0)
Hemoglobin: 15.8 g/dL — ABNORMAL HIGH (ref 12.0–15.0)
Immature Granulocytes: 0 %
Lymphocytes Relative: 22 %
Lymphs Abs: 1.9 10*3/uL (ref 0.7–4.0)
MCH: 29.8 pg (ref 26.0–34.0)
MCHC: 34.4 g/dL (ref 30.0–36.0)
MCV: 86.4 fL (ref 80.0–100.0)
Monocytes Absolute: 0.6 10*3/uL (ref 0.1–1.0)
Monocytes Relative: 7 %
Neutro Abs: 5.9 10*3/uL (ref 1.7–7.7)
Neutrophils Relative %: 70 %
Platelets: 245 10*3/uL (ref 150–400)
RBC: 5.31 MIL/uL — ABNORMAL HIGH (ref 3.87–5.11)
RDW: 12.7 % (ref 11.5–15.5)
WBC: 8.6 10*3/uL (ref 4.0–10.5)
nRBC: 0 % (ref 0.0–0.2)

## 2021-03-17 LAB — TROPONIN I (HIGH SENSITIVITY): Troponin I (High Sensitivity): 4 ng/L (ref <18)

## 2021-03-17 LAB — PREGNANCY, URINE: Preg Test, Ur: NEGATIVE

## 2021-03-17 MED ORDER — IOHEXOL 300 MG/ML  SOLN
100.0000 mL | Freq: Once | INTRAMUSCULAR | Status: AC | PRN
  Administered 2021-03-17: 17:00:00 100 mL via INTRAVENOUS

## 2021-03-17 MED ORDER — ACETAMINOPHEN 325 MG PO TABS
650.0000 mg | ORAL_TABLET | Freq: Once | ORAL | Status: AC
  Administered 2021-03-17: 20:00:00 650 mg via ORAL
  Filled 2021-03-17: qty 2

## 2021-03-17 MED ORDER — MORPHINE SULFATE (PF) 2 MG/ML IV SOLN
2.0000 mg | Freq: Once | INTRAVENOUS | Status: AC
  Administered 2021-03-17: 15:00:00 2 mg via INTRAVENOUS
  Filled 2021-03-17: qty 1

## 2021-03-17 MED ORDER — MORPHINE SULFATE (PF) 2 MG/ML IV SOLN: 2.0000 mg | Freq: Once | INTRAVENOUS | Status: DC

## 2021-03-17 MED ORDER — HYDROCODONE-ACETAMINOPHEN 5-325 MG PO TABS: 1.0000 | ORAL_TABLET | Freq: Four times a day (QID) | ORAL | 0 refills | Status: DC

## 2021-03-17 MED ORDER — MORPHINE SULFATE (PF) 2 MG/ML IV SOLN
2.0000 mg | Freq: Once | INTRAVENOUS | Status: AC
  Administered 2021-03-17: 19:00:00 2 mg via INTRAVENOUS
  Filled 2021-03-17: qty 1
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
## 2021-03-17 NOTE — ED Notes (Signed)
Patient transported to CT 

## 2021-03-17 NOTE — Discharge Instructions (Addendum)
You have a fracture of one of the bones near the elbow.  Please remain in the sling to follow-up with Ortho.  I would like for you to follow-up with them in the next week or so.  With regards to the rest of your imaging was negative for any fractures or dislocations.  You will be sore in the coming days.  I will prescribe you a short course of Norco which is a narcotic pain medication.  Please take for breakthrough pain.  Would like for you to take 600 mg of ibuprofen every 6 hours for pain control.  Please return to the emergency department if you experience worsening pain, trouble breathing, intractable nausea/vomiting/diarrhea, loss of consciousness, weakness to your extremities, numbness to your extremities, or any other concerns you might have.
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
## 2021-03-17 NOTE — ED Notes (Signed)
Pt d/c home per MD order. Discharge summary reviewed, verbalize understanding. Ambulatory off unit. No s/s of acute distress noted. Reports daughter is discharge ride home.

## 2021-03-21 ENCOUNTER — Ambulatory Visit (INDEPENDENT_AMBULATORY_CARE_PROVIDER_SITE_OTHER): Payer: Self-pay | Admitting: Orthopaedic Surgery

## 2021-03-21 ENCOUNTER — Other Ambulatory Visit: Payer: Self-pay

## 2021-03-21 DIAGNOSIS — S52124A Nondisplaced fracture of head of right radius, initial encounter for closed fracture: Secondary | ICD-10-CM
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
## 2021-04-09 ENCOUNTER — Other Ambulatory Visit (HOSPITAL_BASED_OUTPATIENT_CLINIC_OR_DEPARTMENT_OTHER): Payer: Self-pay | Admitting: Orthopaedic Surgery

## 2021-04-09 ENCOUNTER — Ambulatory Visit (INDEPENDENT_AMBULATORY_CARE_PROVIDER_SITE_OTHER): Payer: Self-pay | Admitting: Orthopaedic Surgery

## 2021-04-09 ENCOUNTER — Ambulatory Visit (HOSPITAL_BASED_OUTPATIENT_CLINIC_OR_DEPARTMENT_OTHER)
Admission: RE | Admit: 2021-04-09 | Discharge: 2021-04-09 | Disposition: A | Discharge status: Home / Self Care | Payer: Commercial Managed Care - PPO | Source: Ambulatory Visit | Attending: Orthopaedic Surgery | Admitting: Orthopaedic Surgery

## 2021-04-09 ENCOUNTER — Other Ambulatory Visit: Payer: Self-pay

## 2021-04-09 DIAGNOSIS — S52124A Nondisplaced fracture of head of right radius, initial encounter for closed fracture: Secondary | ICD-10-CM | POA: Insufficient documentation
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
## 2021-04-12 ENCOUNTER — Other Ambulatory Visit: Payer: Self-pay | Admitting: Nurse Practitioner

## 2021-04-12 DIAGNOSIS — I10 Essential (primary) hypertension: Secondary | ICD-10-CM

## 2021-04-16 ENCOUNTER — Other Ambulatory Visit: Payer: Self-pay | Admitting: Nurse Practitioner

## 2021-04-16 DIAGNOSIS — I10 Essential (primary) hypertension: Secondary | ICD-10-CM

## 2021-04-17 ENCOUNTER — Ambulatory Visit (INDEPENDENT_AMBULATORY_CARE_PROVIDER_SITE_OTHER): Payer: Commercial Managed Care - PPO | Admitting: Nurse Practitioner

## 2021-04-17 ENCOUNTER — Other Ambulatory Visit: Payer: Self-pay

## 2021-04-17 ENCOUNTER — Encounter: Payer: Self-pay | Admitting: Nurse Practitioner

## 2021-04-17 VITALS — BP 122/80 | HR 66 | Temp 99.0°F | Ht 63.0 in | Wt 179.0 lb

## 2021-04-17 DIAGNOSIS — E6609 Other obesity due to excess calories: Secondary | ICD-10-CM

## 2021-04-17 DIAGNOSIS — Z6831 Body mass index (BMI) 31.0-31.9, adult: Secondary | ICD-10-CM

## 2021-04-17 DIAGNOSIS — Z1231 Encounter for screening mammogram for malignant neoplasm of breast: Secondary | ICD-10-CM

## 2021-04-17 DIAGNOSIS — Z0000 Encounter for general adult medical examination without abnormal findings: Secondary | ICD-10-CM | POA: Diagnosis not present

## 2021-04-17 DIAGNOSIS — I10 Essential (primary) hypertension: Secondary | ICD-10-CM

## 2021-04-17 DIAGNOSIS — E7800 Pure hypercholesterolemia, unspecified: Secondary | ICD-10-CM

## 2021-04-17 DIAGNOSIS — S52124D Nondisplaced fracture of head of right radius, subsequent encounter for closed fracture with routine healing: Secondary | ICD-10-CM

## 2021-04-17 DIAGNOSIS — Z23 Encounter for immunization: Secondary | ICD-10-CM | POA: Diagnosis not present

## 2021-04-17 DIAGNOSIS — E66811 Obesity, class 1: Secondary | ICD-10-CM

## 2021-04-17 DIAGNOSIS — Z79899 Other long term (current) drug therapy: Secondary | ICD-10-CM

## 2021-04-17 LAB — CMP14+EGFR
ALT: 13 IU/L (ref 0–32)
AST: 18 IU/L (ref 0–40)
Albumin/Globulin Ratio: 1.7 (ref 1.2–2.2)
Albumin: 4.8 g/dL (ref 3.8–4.9)
Alkaline Phosphatase: 105 IU/L (ref 44–121)
BUN/Creatinine Ratio: 17 (ref 9–23)
BUN: 15 mg/dL (ref 6–24)
Bilirubin Total: 0.4 mg/dL (ref 0.0–1.2)
CO2: 27 mmol/L (ref 20–29)
Calcium: 9.8 mg/dL (ref 8.7–10.2)
Chloride: 102 mmol/L (ref 96–106)
Creatinine, Ser: 0.87 mg/dL (ref 0.57–1.00)
Globulin, Total: 2.8 g/dL (ref 1.5–4.5)
Glucose: 89 mg/dL (ref 70–99)
Potassium: 3.8 mmol/L (ref 3.5–5.2)
Sodium: 141 mmol/L (ref 134–144)
Total Protein: 7.6 g/dL (ref 6.0–8.5)
eGFR: 80 mL/min/{1.73_m2} (ref >59)

## 2021-04-17 LAB — CBC
Hematocrit: 48 % — ABNORMAL HIGH (ref 34.0–46.6)
Hemoglobin: 16.2 g/dL — ABNORMAL HIGH (ref 11.1–15.9)
MCH: 29.7 pg (ref 26.6–33.0)
MCHC: 33.8 g/dL (ref 31.5–35.7)
MCV: 88 fL (ref 79–97)
Platelets: 227 10*3/uL (ref 150–450)
RBC: 5.46 x10E6/uL — ABNORMAL HIGH (ref 3.77–5.28)
RDW: 13.1 % (ref 11.7–15.4)
WBC: 6.1 10*3/uL (ref 3.4–10.8)

## 2021-04-17 LAB — LIPID PANEL
Chol/HDL Ratio: 4.2 ratio (ref 0.0–4.4)
Cholesterol, Total: 222 mg/dL — ABNORMAL HIGH (ref 100–199)
HDL: 53 mg/dL (ref >39)
LDL Chol Calc (NIH): 144 mg/dL — ABNORMAL HIGH (ref 0–99)
Triglycerides: 142 mg/dL (ref 0–149)
VLDL Cholesterol Cal: 25 mg/dL (ref 5–40)

## 2021-04-17 LAB — POCT URINALYSIS DIPSTICK
Bilirubin, UA: NEGATIVE
Glucose, UA: NEGATIVE
Ketones, UA: NEGATIVE
Leukocytes, UA: NEGATIVE
Nitrite, UA: NEGATIVE
Protein, UA: NEGATIVE
Spec Grav, UA: 1.02 (ref 1.010–1.025)
Urobilinogen, UA: 0.2 E.U./dL
pH, UA: 5.5 (ref 5.0–8.0)

## 2021-04-17 LAB — POCT UA - MICROALBUMIN
Albumin/Creatinine Ratio, Urine, POC: 30
Creatinine, POC: 100 mg/dL
Microalbumin Ur, POC: 10 mg/L

## 2021-04-17 NOTE — Patient Instructions (Addendum)
Health Maintenance, Female °Adopting a healthy lifestyle and getting preventive care are important in promoting health and wellness. Ask your health care provider about: °The right schedule for you to have regular tests and exams. °Things you can do on your own to prevent diseases and keep yourself healthy. °What should I know about diet, weight, and exercise? °Eat a healthy diet ° °Eat a diet that includes plenty of vegetables, fruits, low-fat dairy products, and lean protein. °Do not eat a lot of foods that are high in solid fats, added sugars, or sodium. °Maintain a healthy weight °Body mass index (BMI) is used to identify weight problems. It estimates body fat based on height and weight. Your health care provider can help determine your BMI and help you achieve or maintain a healthy weight. °Get regular exercise °Get regular exercise. This is one of the most important things you can do for your health. Most adults should: °Exercise for at least 150 minutes each week. The exercise should increase your heart rate and make you sweat (moderate-intensity exercise). °Do strengthening exercises at least twice a week. This is in addition to the moderate-intensity exercise. °Spend less time sitting. Even light physical activity can be beneficial. °Watch cholesterol and blood lipids °Have your blood tested for lipids and cholesterol at 53 years of age, then have this test every 5 years. °Have your cholesterol levels checked more often if: °Your lipid or cholesterol levels are high. °You are older than 53 years of age. °You are at high risk for heart disease. °What should I know about cancer screening? °Depending on your health history and family history, you may need to have cancer screening at various ages. This may include screening for: °Breast cancer. °Cervical cancer. °Colorectal cancer. °Skin cancer. °Lung cancer. °What should I know about heart disease, diabetes, and high blood pressure? °Blood pressure and heart  disease °High blood pressure causes heart disease and increases the risk of stroke. This is more likely to develop in people who have high blood pressure readings or are overweight. °Have your blood pressure checked: °Every 3-5 years if you are 18-39 years of age. °Every year if you are 40 years old or older. °Diabetes °Have regular diabetes screenings. This checks your fasting blood sugar level. Have the screening done: °Once every three years after age 40 if you are at a normal weight and have a low risk for diabetes. °More often and at a younger age if you are overweight or have a high risk for diabetes. °What should I know about preventing infection? °Hepatitis B °If you have a higher risk for hepatitis B, you should be screened for this virus. Talk with your health care provider to find out if you are at risk for hepatitis B infection. °Hepatitis C °Testing is recommended for: °Everyone born from 1945 through 1965. °Anyone with known risk factors for hepatitis C. °Sexually transmitted infections (STIs) °Get screened for STIs, including gonorrhea and chlamydia, if: °You are sexually active and are younger than 53 years of age. °You are older than 53 years of age and your health care provider tells you that you are at risk for this type of infection. °Your sexual activity has changed since you were last screened, and you are at increased risk for chlamydia or gonorrhea. Ask your health care provider if you are at risk. °Ask your health care provider about whether you are at high risk for HIV. Your health care provider may recommend a prescription medicine to help prevent HIV   infection. If you choose to take medicine to prevent HIV, you should first get tested for HIV. You should then be tested every 3 months for as long as you are taking the medicine. Pregnancy If you are about to stop having your period (premenopausal) and you may become pregnant, seek counseling before you get pregnant. Take 400 to 800  micrograms (mcg) of folic acid every day if you become pregnant. Ask for birth control (contraception) if you want to prevent pregnancy. Osteoporosis and menopause Osteoporosis is a disease in which the bones lose minerals and strength with aging. This can result in bone fractures. If you are 12 years old or older, or if you are at risk for osteoporosis and fractures, ask your health care provider if you should: Be screened for bone loss. Take a calcium or vitamin D supplement to lower your risk of fractures. Be given hormone replacement therapy (HRT) to treat symptoms of menopause. Follow these instructions at home: Alcohol use Do not drink alcohol if: Your health care provider tells you not to drink. You are pregnant, may be pregnant, or are planning to become pregnant. If you drink alcohol: Limit how much you have to: 0-1 drink a day. Know how much alcohol is in your drink. In the [place] one drink equals one 12 oz bottle of beer (355 mL), one 5 oz glass of wine (148 mL), or one 1 oz glass of hard liquor (44 mL). Lifestyle Do not use any products that contain nicotine or tobacco. These products include cigarettes, chewing tobacco, and vaping devices, such as e-cigarettes. If you need help quitting, ask your health care provider. Do not use street drugs. Do not share needles. Ask your health care provider for help if you need support or information about quitting drugs. General instructions Schedule regular health, dental, and eye exams. Stay current with your vaccines. Tell your health care provider if: You often feel depressed. You have ever been abused or do not feel safe at home. Summary Adopting a healthy lifestyle and getting preventive care are important in promoting health and wellness. Follow your health care provider's instructions about healthy diet, exercising, and getting tested or screened for diseases. Follow your health care provider's instructions on monitoring your  cholesterol and blood pressure. This information is not intended to replace advice given to you by your health care provider. Make sure you discuss any questions you have with your health care provider. Document Revised: 08/20/2020 Document Reviewed: 08/20/2020 Elsevier Patient Education  [place].   Influenza (Flu) Vaccine (Inactivated or Recombinant): What You Need to Know 1. Why get vaccinated? Influenza vaccine can prevent influenza (flu). Flu is a contagious disease that spreads around the [place] every year, usually between October and May. Anyone can get the flu, but it is more dangerous for some people. Infants and young children, people 13 years and older, pregnant people, and people with certain health conditions or a weakened immune system are at greatest risk of flu complications. Pneumonia, bronchitis, sinus infections, and ear infections are examples of flu-related complications. If you have a medical condition, such as heart disease, cancer, or diabetes, flu can make it worse. Flu can cause fever and chills, sore throat, muscle aches, fatigue, cough, headache, and runny or stuffy nose. Some people may have vomiting and diarrhea, though this is more common in children than adults. In an average year, thousands of people in the [place] die from flu, and many more are hospitalized. Flu vaccine prevents  millions of illnesses and flu-related visits to the doctor each year. 2. Influenza vaccines CDC recommends everyone 6 months and older get vaccinated every flu season. Children 6 months through 17 years of age may need 2 doses during a single flu season. Everyone else needs only 1 dose each flu season. It takes about 2 weeks for protection to develop after vaccination. There are many flu viruses, and they are always changing. Each year a new flu vaccine is made to protect against the influenza viruses believed to be likely to cause disease in the upcoming flu season.  Even when the vaccine doesn't exactly match these viruses, it may still provide some protection. Influenza vaccine does not cause flu. Influenza vaccine may be given at the same time as other vaccines. 3. Talk with your health care provider Tell your vaccination provider if the person getting the vaccine: Has had an allergic reaction after a previous dose of influenza vaccine, or has any severe, life-threatening allergies Has ever had Guillain-Barr Syndrome (also called "GBS") In some cases, your health care provider may decide to postpone influenza vaccination until a future visit. Influenza vaccine can be administered at any time during pregnancy. People who are or will be pregnant during influenza season should receive inactivated influenza vaccine. People with minor illnesses, such as a cold, may be vaccinated. People who are moderately or severely ill should usually wait until they recover before getting influenza vaccine. Your health care provider can give you more information. 4. Risks of a vaccine reaction Soreness, redness, and swelling where the shot is given, fever, muscle aches, and headache can happen after influenza vaccination. There may be a very small increased risk of Guillain-Barr Syndrome (GBS) after inactivated influenza vaccine (the flu shot). Young children who get the flu shot along with pneumococcal vaccine (PCV13) and/or DTaP vaccine at the same time might be slightly more likely to have a seizure caused by fever. Tell your health care provider if a child who is getting flu vaccine has ever had a seizure. People sometimes faint after medical procedures, including vaccination. Tell your provider if you feel dizzy or have vision changes or ringing in the ears. As with any medicine, there is a very remote chance of a vaccine causing a severe allergic reaction, other serious injury, or death. 5. What if there is a serious problem? An allergic reaction could occur after the  vaccinated person leaves the clinic. If you see signs of a severe allergic reaction (hives, swelling of the face and throat, difficulty breathing, a fast heartbeat, dizziness, or weakness), call 9-1-1 and get the person to the nearest hospital. For other signs that concern you, call your health care provider. Adverse reactions should be reported to the Vaccine Adverse Event Reporting System (VAERS). Your health care provider will usually file this report, or you can do it yourself. Visit the VAERS website at www.vaers.SamedayNews.es or call 647-882-6654. VAERS is only for reporting reactions, and VAERS staff members do not give medical advice. 6. The National Vaccine Injury Compensation Program The [employer] Vaccine Injury Compensation Program (VICP) is a federal program that was created to compensate people who may have been injured by certain vaccines. Claims regarding alleged injury or death due to vaccination have a time limit for filing, which may be as short as two years. Visit the VICP website at GoldCloset.com.ee or call 709-461-3735 to learn about the program and about filing a claim. 7. How can I learn more? Ask your health care provider. Call your  local or state health department. Visit the website of the Food and Drug Administration (FDA) for vaccine package inserts and additional information at TraderRating.uy. Contact the Centers for Disease Control and Prevention (CDC): Call 228-251-3896 (1-800-CDC-INFO) or Visit CDC's website at https://gibson.com/. Vaccine Information Statement Inactivated Influenza Vaccine (11/18/2019) This information is not intended to replace advice given to you by your health care provider. Make sure you discuss any questions you have with your health care provider. Document Revised: 12/20/2020 Document Reviewed: 12/20/2020 Elsevier Patient Education  2022 [employer].
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
## 2021-04-27 ENCOUNTER — Encounter: Payer: Self-pay | Admitting: Nurse Practitioner

## 2021-05-09 ENCOUNTER — Other Ambulatory Visit: Payer: Self-pay | Admitting: Nurse Practitioner

## 2021-05-09 DIAGNOSIS — I10 Essential (primary) hypertension: Secondary | ICD-10-CM

## 2021-05-10 ENCOUNTER — Other Ambulatory Visit: Payer: Self-pay

## 2021-05-10 DIAGNOSIS — R430 Anosmia: Secondary | ICD-10-CM

## 2021-05-10 DIAGNOSIS — I10 Essential (primary) hypertension: Secondary | ICD-10-CM

## 2021-05-10 MED ORDER — FLUTICASONE PROPIONATE 50 MCG/ACT NA SUSP: 2.0000 | Freq: Every day | NASAL | 2 refills | Status: DC

## 2021-05-10 MED ORDER — ATENOLOL 50 MG PO TABS: 50.0000 mg | ORAL_TABLET | Freq: Every day | ORAL | 0 refills | Status: DC

## 2021-05-10 MED ORDER — HYDROCHLOROTHIAZIDE 25 MG PO TABS: 25.0000 mg | ORAL_TABLET | Freq: Every day | ORAL | 0 refills | Status: DC

## 2021-05-23 ENCOUNTER — Other Ambulatory Visit: Payer: Self-pay | Admitting: Nurse Practitioner

## 2021-05-23 ENCOUNTER — Other Ambulatory Visit: Payer: Self-pay

## 2021-05-23 ENCOUNTER — Ambulatory Visit
Admission: RE | Admit: 2021-05-23 | Discharge: 2021-05-23 | Disposition: A | Discharge status: Home / Self Care | Payer: Commercial Managed Care - PPO | Source: Ambulatory Visit | Attending: Nurse Practitioner | Admitting: Nurse Practitioner

## 2021-05-23 DIAGNOSIS — Z1231 Encounter for screening mammogram for malignant neoplasm of breast: Secondary | ICD-10-CM

## 2021-05-23 DIAGNOSIS — S52124D Nondisplaced fracture of head of right radius, subsequent encounter for closed fracture with routine healing: Secondary | ICD-10-CM

## 2021-05-27 ENCOUNTER — Other Ambulatory Visit: Payer: Self-pay | Admitting: Nurse Practitioner

## 2021-05-27 DIAGNOSIS — D751 Secondary polycythemia: Secondary | ICD-10-CM

## 2021-06-03 ENCOUNTER — Telehealth: Payer: Self-pay | Admitting: Physician Assistant

## 2021-06-03 NOTE — Telephone Encounter (Signed)
Scheduled appt per 2/13 referral. Pt is aware of appt date and time. Pt is aware to arrive 15 mins prior to appt time and to bring and updated insurance card. Pt is aware of appt location.   

## 2021-06-05 ENCOUNTER — Ambulatory Visit: Payer: Commercial Managed Care - PPO | Admitting: Nurse Practitioner

## 2021-06-14 DIAGNOSIS — M4726 Other spondylosis with radiculopathy, lumbar region: Secondary | ICD-10-CM | POA: Insufficient documentation

## 2021-06-14 DIAGNOSIS — M62830 Muscle spasm of back: Secondary | ICD-10-CM | POA: Insufficient documentation

## 2021-06-14 DIAGNOSIS — M545 Low back pain, unspecified: Secondary | ICD-10-CM

## 2021-06-14 HISTORY — DX: Low back pain, unspecified: M54.50

## 2021-06-19 ENCOUNTER — Emergency Department (HOSPITAL_COMMUNITY): Payer: Commercial Managed Care - PPO

## 2021-06-19 ENCOUNTER — Emergency Department (HOSPITAL_COMMUNITY)
Admission: EM | Admit: 2021-06-19 | Discharge: 2021-06-19 | Disposition: A | Discharge status: Home / Self Care | Payer: Commercial Managed Care - PPO | Attending: Emergency Medicine | Admitting: Emergency Medicine

## 2021-06-19 ENCOUNTER — Ambulatory Visit (HOSPITAL_BASED_OUTPATIENT_CLINIC_OR_DEPARTMENT_OTHER): Payer: Commercial Managed Care - PPO | Admitting: Orthopaedic Surgery

## 2021-06-19 ENCOUNTER — Other Ambulatory Visit: Payer: Self-pay

## 2021-06-19 DIAGNOSIS — G834 Cauda equina syndrome: Secondary | ICD-10-CM | POA: Diagnosis not present

## 2021-06-19 DIAGNOSIS — M4726 Other spondylosis with radiculopathy, lumbar region: Secondary | ICD-10-CM | POA: Insufficient documentation

## 2021-06-19 DIAGNOSIS — S52124A Nondisplaced fracture of head of right radius, initial encounter for closed fracture: Secondary | ICD-10-CM

## 2021-06-19 DIAGNOSIS — M5431 Sciatica, right side: Secondary | ICD-10-CM

## 2021-06-19 DIAGNOSIS — M5126 Other intervertebral disc displacement, lumbar region: Secondary | ICD-10-CM

## 2021-06-19 DIAGNOSIS — Z20822 Contact with and (suspected) exposure to covid-19: Secondary | ICD-10-CM | POA: Diagnosis not present

## 2021-06-19 DIAGNOSIS — M5441 Lumbago with sciatica, right side: Secondary | ICD-10-CM | POA: Diagnosis not present

## 2021-06-19 DIAGNOSIS — M545 Low back pain, unspecified: Secondary | ICD-10-CM | POA: Diagnosis present

## 2021-06-19 LAB — URINALYSIS, ROUTINE W REFLEX MICROSCOPIC
Bilirubin Urine: NEGATIVE
Glucose, UA: NEGATIVE mg/dL
Ketones, ur: NEGATIVE mg/dL
Leukocytes,Ua: NEGATIVE
Nitrite: NEGATIVE
Protein, ur: NEGATIVE mg/dL
Specific Gravity, Urine: 1.012 (ref 1.005–1.030)
pH: 5 (ref 5.0–8.0)

## 2021-06-19 LAB — RESP PANEL BY RT-PCR (FLU A&B, COVID) ARPGX2
Influenza A by PCR: NEGATIVE
Influenza B by PCR: NEGATIVE
SARS Coronavirus 2 by RT PCR: NEGATIVE

## 2021-06-19 LAB — CBC WITH DIFFERENTIAL/PLATELET
Abs Immature Granulocytes: 0.01 10*3/uL (ref 0.00–0.07)
Basophils Absolute: 0 10*3/uL (ref 0.0–0.1)
Basophils Relative: 1 %
Eosinophils Absolute: 0 10*3/uL (ref 0.0–0.5)
Eosinophils Relative: 1 %
HCT: 49.9 % — ABNORMAL HIGH (ref 36.0–46.0)
Hemoglobin: 16.7 g/dL — ABNORMAL HIGH (ref 12.0–15.0)
Immature Granulocytes: 0 %
Lymphocytes Relative: 22 %
Lymphs Abs: 1.5 10*3/uL (ref 0.7–4.0)
MCH: 29.7 pg (ref 26.0–34.0)
MCHC: 33.5 g/dL (ref 30.0–36.0)
MCV: 88.6 fL (ref 80.0–100.0)
Monocytes Absolute: 0.4 10*3/uL (ref 0.1–1.0)
Monocytes Relative: 6 %
Neutro Abs: 4.8 10*3/uL (ref 1.7–7.7)
Neutrophils Relative %: 70 %
Platelets: 221 10*3/uL (ref 150–400)
RBC: 5.63 MIL/uL — ABNORMAL HIGH (ref 3.87–5.11)
RDW: 12.2 % (ref 11.5–15.5)
WBC: 6.8 10*3/uL (ref 4.0–10.5)
nRBC: 0 % (ref 0.0–0.2)

## 2021-06-19 LAB — BASIC METABOLIC PANEL
Anion gap: 9 (ref 5–15)
BUN: 16 mg/dL (ref 6–20)
CO2: 27 mmol/L (ref 22–32)
Calcium: 9.4 mg/dL (ref 8.9–10.3)
Chloride: 101 mmol/L (ref 98–111)
Creatinine, Ser: 0.78 mg/dL (ref 0.44–1.00)
GFR, Estimated: >60 mL/min (ref >60)
Glucose, Bld: 83 mg/dL (ref 70–99)
Potassium: 3.3 mmol/L — ABNORMAL LOW (ref 3.5–5.1)
Sodium: 137 mmol/L (ref 135–145)

## 2021-06-19 NOTE — Progress Notes (Signed)
? ?                            ? ? ?Chief Complaint: right lower back and leg pain ?  ? ? ?History of Present Illness:  ? ? ?Renee Li is a 51 y.o. female presents with right lower back pain with radiation down the right leg.  She states that this has been going on for several weeks although over the course of the last several days she has had loss of bladder control and is urinating at night which she is not able to catch.  She states that the symptoms are completely new.  Denies any history of lumbar spine disease.  She did have x-rays done in outside facility which showed some degenerative disc disease. ? ? ? ?Surgical History:   ?None ? ?PMH/PSH/Family History/Social History/Meds/Allergies:   ? ?Past Medical History:  ?Diagnosis Date  ? Anemia   ? Depression   ? Heart murmur   ? never caused any problems  ? Hypertension   ? Migraines   ? ?Past Surgical History:  ?Procedure Laterality Date  ? APPENDECTOMY    ? WISDOM TOOTH EXTRACTION    ? ?Social History  ? ?Socioeconomic History  ? Marital status: Divorced  ?  Spouse name: Not on file  ? Number of children: 1  ? Years of education: Not on file  ? Highest education level: Not on file  ?Occupational History  ? 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 Smoking status: Never  ? Smokeless tobacco: Never  ?Vaping Use  ? Vaping Use: Never used  ?Substance and Sexual Activity  ? Alcohol use: Yes  ?  Alcohol/week: 1.0 standard drink  ?  Types: 1 Glasses of wine per week  ? Drug use: No  ? Sexual activity: Not Currently  ?  Birth control/protection: I.U.D.  ?  Comment: Mirena ID  ?Other Topics Concern  ? Not on file  ?Social History Narrative  ? Not on file  ? ?Social Determinants of Health  ? ?Financial Resource Strain: Not on file  ?Food Insecurity: Not on file  ?Transportation Needs: Not on file  ?Physical Activity: Not on file  ?Stress: Not on file  ?Social Connections: Not on file  ? ?Family History  ?Problem Relation Age of Onset  ? Cancer Mother   ?      throat  ? Stroke Maternal Grandmother   ? Diabetes Maternal Grandmother   ? Cancer Maternal Grandfather   ?     prostate  ? Heart disease Paternal Grandmother   ? Colon cancer Neg Hx   ? Rectal cancer Neg Hx   ? Stomach cancer Neg Hx   ? Esophageal cancer Neg Hx   ? Breast cancer Neg Hx   ? ?Allergies  ?Allergen Reactions  ? Aleve [Naproxen] Hives  ? Ibuprofen Hives, Itching and Rash  ? ?Current Outpatient Medications  ?Medication Sig Dispense Refill  ? aspirin-acetaminophen-caffeine (EXCEDRIN MIGRAINE) 916-945-03 MG per tablet Take 1 tablet by mouth every 6 (six) hours as needed for headache.    ? atenolol (TENORMIN) 50 MG tablet Take 1 tablet (50 mg total) by mouth daily. 90 tablet 0  ? diazepam (VALIUM) 5 MG tablet Take 1 tablet (5 mg total) by mouth every 12 (twelve) hours as needed for muscle spasms. 10 tablet 0  ? fluticasone (FLONASE) 50 MCG/ACT nasal spray Place 2 sprays into both nostrils daily. 18.2 mL  2  ? hydrochlorothiazide (HYDRODIURIL) 25 MG tablet Take 1 tablet (25 mg total) by mouth daily. 90 tablet 0  ? HYDROcodone-acetaminophen (NORCO/VICODIN) 5-325 MG tablet Take 1 tablet by mouth every 6 (six) hours. 10 tablet 0  ? ?No current facility-administered medications for this visit.  ? ?No results found. ? ?Review of Systems:   ?A ROS was performed including pertinent positives and negatives as documented in the HPI. ? ?Physical Exam :   ?Constitutional: NAD and appears stated age ?Neurological: Alert and oriented ?Psych: Appropriate affect and cooperative ?There were no vitals taken for this visit.  ? ?Comprehensive Musculoskeletal Exam:   ? ?She has pain radiating from the right side down the leg.  She does have weakness with knee extension as well as dorsiflexion of the right foot.  Reflexes diminished on the side compared to contralateral. ? ?Imaging:   ? ? ?I personally reviewed and interpreted the radiographs. ? ? ?Assessment:   ?51 year old female with back pain radiating down the right lower  extremity.  I did discuss that given her urinary changes at this time I am concerned for cauda equina syndrome.  As result I would recommend that she go to the emergency room at this time for urgent MRI and spine consultation.  We did discuss that without getting this addressed in a timely fashion is possible that the urinary symptoms might persist.  She will plan to proceed with the emergency room at this time. ? ?Plan :   ? ?-Return to clinic 2 months with me for reassessment of the right elbow ? ? ? ? ?I personally saw and evaluated the patient, and participated in the management and treatment plan. ? ?Vanetta Mulders, MD ?Attending Physician, Orthopedic Surgery ? ?This document was dictated using Systems analyst. A reasonable attempt at proof reading has been made to minimize errors. ?

## 2021-06-19 NOTE — ED Triage Notes (Signed)
Pt states she on 2/19 she was carrying bag up stairs and that night she started having back spasms. Since then, she has had lower back pain that radiates down her right leg. ?

## 2021-06-19 NOTE — ED Provider Notes (Signed)
?[facility] DEPT ?Provider Note ? ? ?CSN: [id number] ?Arrival date & time: 06/19/21  1013 ? ?  ? ?History ? ?Chief Complaint  ?Patient presents with  ? Back Pain  ? ? ?Renee Li is a 51 y.o. female. ? ?HPI ?She is here for progressive symptoms with lower back and right leg discomfort, progressive since an original injury on 06/02/2021.  At that time she was carrying some luggage up a stairway, and experienced low back pain.  She was evaluated at that time and started on prednisone.  This did not change her symptoms.  She has now developed nocturnal incontinence of urination.  She was seen by an orthopedist this morning who directed her here for evaluation of "cauda equina syndrome."  She denies fever, chills, nausea or vomiting.  She has current active prescriptions for Ultracet, and Flexeril and taking them sporadically.  She drove here today so has not taken that treatment today.  These were received from an urgent care visit, 6 days ago.  She works as a Catering manager. ?  ? ?Home Medications ?Prior to Admission medications   ?Medication Sig Start Date End Date Taking? Authorizing Provider  ?aspirin-acetaminophen-caffeine (EXCEDRIN MIGRAINE) 250-250-65 MG per tablet Take 1 tablet by mouth every 6 (six) hours as needed for headache.    [provider]  ?atenolol (TENORMIN) 50 MG tablet Take 1 tablet (50 mg total) by mouth daily. 05/10/21   [name], FNP  ?diazepam (VALIUM) 5 MG tablet Take 1 tablet (5 mg total) by mouth every 12 (twelve) hours as needed for muscle spasms. 01/21/20   Vanessa Kick, MD  ?fluticasone (FLONASE) 50 MCG/ACT nasal spray Place 2 sprays into both nostrils daily. 05/10/21 05/10/22  [name], FNP  ?hydrochlorothiazide (HYDRODIURIL) 25 MG tablet Take 1 tablet (25 mg total) by mouth daily. 05/10/21   [name], [place]  ?HYDROcodone-acetaminophen (NORCO/VICODIN) 5-325 MG tablet Take 1 tablet by mouth every 6 (six) hours. 03/17/21   Hendricks Limes, PA-C  ?   ? ?Allergies    ?Aleve [naproxen] and Ibuprofen   ? ?Review of Systems   ?Review of Systems ? ?Physical Exam ?Updated Vital Signs ?BP (!) 122/97 (BP Location: Left Arm)   Pulse 65   Temp 99.1 ?F (37.3 ?C) (Oral)   Resp 20   Ht '5\' 3"'$  (1.6 m)   Wt 77.1 kg   SpO2 93%   BMI 30.11 kg/m?  ?Physical Exam ? ?ED Results / Procedures / Treatments   ?Labs ?(all labs ordered are listed, but only abnormal results are displayed) ?Labs Reviewed  ?BASIC METABOLIC PANEL - Abnormal; Notable for the following components:  ?    Result Value  ? Potassium 3.3 (*)   ? All other components within normal limits  ?CBC WITH DIFFERENTIAL/PLATELET - Abnormal; Notable for the following components:  ? RBC 5.63 (*)   ? Hemoglobin 16.7 (*)   ? HCT 49.9 (*)   ? All other components within normal limits  ?URINALYSIS, ROUTINE W REFLEX MICROSCOPIC - Abnormal; Notable for the following components:  ? Hgb urine dipstick SMALL (*)   ? Bacteria, UA RARE (*)   ? All other components within normal limits  ?RESP PANEL BY RT-PCR (FLU A&B, COVID) ARPGX2  ? ? ?EKG ?None ? ?Radiology ?MR LUMBAR SPINE WO CONTRAST ? ?Result Date: 06/19/2021 ?CLINICAL DATA:  Low back pain, cauda equina syndrome suspected EXAM: MRI LUMBAR SPINE WITHOUT CONTRAST TECHNIQUE: Multiplanar, multisequence MR imaging of the lumbar spine was  performed. No intravenous contrast was administered. COMPARISON:  None. FINDINGS: Segmentation:  Standard. Alignment:  No significant listhesis. Vertebrae: Minor loss of height at the superior endplate of L3 eccentric to the right with underlying marrow edema. Vertebral body heights are otherwise maintained. No suspicious osseous lesion. Conus medullaris and cauda equina: Conus extends to the L1 level. Conus and cauda equina appear normal. Paraspinal and other soft tissues: Unremarkable. Disc levels: T12-L1: Shallow central protrusion.  No canal or foraminal stenosis. L1-L2: Right central/subarticular disc protrusion. No canal  stenosis. Narrowing of the right subarticular recess with potential compression of right L2 nerve root. No foraminal stenosis. L2-L3: Right subarticular disc protrusion superimposed on mild disc bulge. No canal stenosis. Effacement of right subarticular recess with compression of right L3 nerve root. There is also a superiorly directed extrusion component of the disc herniation that likely compresses the exiting L2 nerve root. No foraminal stenosis. L3-L4:  Minimal disc bulge.  No canal or foraminal stenosis. L4-L5: Minimal disc bulge with a small central extrusion extending just below disc level. Mild facet arthropathy. No canal or foraminal stenosis. L5-S1:  Mild facet arthropathy.  No canal or foraminal stenosis. IMPRESSION: Minor loss of height at the superior endplate of L3. Underlying marrow edema may reflect recent compression fracture or may be on a degenerative basis. Multilevel degenerative changes as detailed above. Disc herniations at at L1-L2 and L2-L3. Compression of the right L3 nerve root at L2-L3. Probable compression of right L2 nerve root at L2-L3. Possible compression of right L2 nerve root at L1-L2. Electronically Signed   By: Macy Mis M.D.   On: 06/19/2021 13:14   ? ?Procedures ?Procedures  ? ? ?Medications Ordered in ED ?Medications - No data to display ? ?ED Course/ Medical Decision Making/ A&P ?Clinical Course as of 06/20/21 1528  ?Wed Jun 19, 2021  ?1529 I discussed case with neurosurgery, Dr. Earle Gell.  He reviewed the images with me.  He will evaluate the patient in his office, now [EW]  ?  ?Clinical Course User Index ?[EW] Daleen Bo, MD  ? ?                        ?Medical Decision Making ?Presenting with sciatica symptoms, low back pain and nonspecific urinary incontinence.  These were previously evaluated, and she has persistent symptoms. ? ?Amount and/or Complexity of Data Reviewed ?Independent Historian:  ?   Details: She is a cogent historian ?External Data Reviewed:  labs, radiology and notes. ?   Details: Diagnosed with degenerative joint disease, 6 days ago during urgent care visit.  Had urinalysis and urine culture done at that time but did not reveal urinary tract infection.  Office note today from orthopedics concerning for lumbar myelopathy so she was sent here. ?Labs: ordered. ?   Details: CBC, metabolic panel, viral panel-within normal limits, except potassium slightly low at ?Radiology: ordered and independent interpretation performed. ?   Details: MRI lumbar-herniated disc at L1-L2 and L to L3; findings consistent with bowel 2 and L3 nerve root compression. ?Discussion of management or test interpretation with external provider(s): Case discussed with neurosurgeon, Dr. Arnoldo Morale who will see the patient in the office today for further evaluation and management planning. ? ?Risk ?Decision regarding hospitalization. ?Risk Details: Patient with back pain, numbness and weakness in the right leg and difficulty walking secondary to pain.  She had an injury several weeks ago and has been found to have herniated disc.  Doubt cauda  equina syndrome, her postvoid urinary bladder residual is 0.  No evidence for fracture.  No indication for hospitalization at this time.  Patient was able to drive herself here and is amatory at discharge.  She is going to the neurosurgeons office for reevaluation. ? ? ? ? ? ? ? ? ? ? ?Final Clinical Impression(s) / ED Diagnoses ?Final diagnoses:  ?Sciatica of right side  ?Osteoarthritis of spine with radiculopathy, lumbar region  ?Herniated intervertebral disc of lumbar spine  ? ? ?Rx / DC Orders ?ED Discharge Orders   ? ? None  ? ?  ? ? ?  ?Daleen Bo, MD ?06/20/21 1530 ? ?

## 2021-06-21 ENCOUNTER — Telehealth: Payer: Self-pay

## 2021-06-21 NOTE — Telephone Encounter (Signed)
Transition Care Management Unsuccessful Follow-up Telephone Call ? ?Date of discharge and from where:  06/19/2021, [facility] ? ?Attempts:  1st Attempt ? ?Reason for unsuccessful TCM follow-up call:  Left voice message ? ?  ?

## 2021-06-25 ENCOUNTER — Ambulatory Visit: Payer: Commercial Managed Care - PPO | Admitting: Nurse Practitioner

## 2021-06-25 ENCOUNTER — Other Ambulatory Visit: Payer: Self-pay

## 2021-06-25 ENCOUNTER — Encounter: Payer: Self-pay | Admitting: Nurse Practitioner

## 2021-06-25 VITALS — BP 106/80 | HR 73 | Temp 98.1°F | Ht 63.0 in | Wt 171.0 lb

## 2021-06-25 DIAGNOSIS — F330 Major depressive disorder, recurrent, mild: Secondary | ICD-10-CM

## 2021-06-25 DIAGNOSIS — M5126 Other intervertebral disc displacement, lumbar region: Secondary | ICD-10-CM

## 2021-06-25 MED ORDER — BUPROPION HCL ER (XL) 150 MG PO TB24: 150.0000 mg | ORAL_TABLET | ORAL | 1 refills | Status: DC
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
## 2021-06-28 ENCOUNTER — Encounter: Payer: Self-pay | Admitting: Physician Assistant

## 2021-06-28 ENCOUNTER — Other Ambulatory Visit: Payer: Self-pay

## 2021-06-28 ENCOUNTER — Inpatient Hospital Stay: Payer: Commercial Managed Care - PPO

## 2021-06-28 ENCOUNTER — Inpatient Hospital Stay: Payer: Commercial Managed Care - PPO | Attending: Physician Assistant | Admitting: Physician Assistant

## 2021-06-28 VITALS — BP 119/82 | HR 51 | Temp 97.7°F | Resp 18 | Ht 63.0 in | Wt 173.4 lb

## 2021-06-28 DIAGNOSIS — D751 Secondary polycythemia: Secondary | ICD-10-CM | POA: Insufficient documentation

## 2021-06-28 DIAGNOSIS — Z79899 Other long term (current) drug therapy: Secondary | ICD-10-CM | POA: Insufficient documentation

## 2021-06-28 DIAGNOSIS — R221 Localized swelling, mass and lump, neck: Secondary | ICD-10-CM | POA: Insufficient documentation

## 2021-06-28 DIAGNOSIS — I10 Essential (primary) hypertension: Secondary | ICD-10-CM | POA: Diagnosis not present

## 2021-06-28 LAB — CBC WITH DIFFERENTIAL (CANCER CENTER ONLY)
Abs Immature Granulocytes: 0.03 10*3/uL (ref 0.00–0.07)
Basophils Absolute: 0 10*3/uL (ref 0.0–0.1)
Basophils Relative: 0 %
Eosinophils Absolute: 0 10*3/uL (ref 0.0–0.5)
Eosinophils Relative: 0 %
HCT: 46.1 % — ABNORMAL HIGH (ref 36.0–46.0)
Hemoglobin: 15.4 g/dL — ABNORMAL HIGH (ref 12.0–15.0)
Immature Granulocytes: 0 %
Lymphocytes Relative: 11 %
Lymphs Abs: 1 10*3/uL (ref 0.7–4.0)
MCH: 29.2 pg (ref 26.0–34.0)
MCHC: 33.4 g/dL (ref 30.0–36.0)
MCV: 87.3 fL (ref 80.0–100.0)
Monocytes Absolute: 0.2 10*3/uL (ref 0.1–1.0)
Monocytes Relative: 2 %
Neutro Abs: 7.4 10*3/uL (ref 1.7–7.7)
Neutrophils Relative %: 87 %
Platelet Count: 248 10*3/uL (ref 150–400)
RBC: 5.28 MIL/uL — ABNORMAL HIGH (ref 3.87–5.11)
RDW: 12.2 % (ref 11.5–15.5)
WBC Count: 8.6 10*3/uL (ref 4.0–10.5)
nRBC: 0 % (ref 0.0–0.2)

## 2021-06-28 LAB — CMP (CANCER CENTER ONLY)
ALT: 16 U/L (ref 0–44)
AST: 16 U/L (ref 15–41)
Albumin: 4.7 g/dL (ref 3.5–5.0)
Alkaline Phosphatase: 75 U/L (ref 38–126)
Anion gap: 8 (ref 5–15)
BUN: 22 mg/dL — ABNORMAL HIGH (ref 6–20)
CO2: 31 mmol/L (ref 22–32)
Calcium: 9.8 mg/dL (ref 8.9–10.3)
Chloride: 101 mmol/L (ref 98–111)
Creatinine: 1.09 mg/dL — ABNORMAL HIGH (ref 0.44–1.00)
GFR, Estimated: >60 mL/min (ref >60)
Glucose, Bld: 96 mg/dL (ref 70–99)
Potassium: 3.2 mmol/L — ABNORMAL LOW (ref 3.5–5.1)
Sodium: 140 mmol/L (ref 135–145)
Total Bilirubin: 0.4 mg/dL (ref 0.3–1.2)
Total Protein: 8.1 g/dL (ref 6.5–8.1)
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
## 2021-06-30 DIAGNOSIS — R221 Localized swelling, mass and lump, neck: Secondary | ICD-10-CM | POA: Insufficient documentation

## 2021-06-30 DIAGNOSIS — D751 Secondary polycythemia: Secondary | ICD-10-CM | POA: Insufficient documentation

## 2021-06-30 HISTORY — DX: Localized swelling, mass and lump, neck: R22.1

## 2021-06-30 LAB — ERYTHROPOIETIN: Erythropoietin: 8.8 m[IU]/mL (ref 2.6–18.5)

## 2021-07-02 ENCOUNTER — Other Ambulatory Visit: Payer: Self-pay

## 2021-07-02 ENCOUNTER — Ambulatory Visit (HOSPITAL_COMMUNITY)
Admission: RE | Admit: 2021-07-02 | Discharge: 2021-07-02 | Disposition: A | Discharge status: Home / Self Care | Payer: Commercial Managed Care - PPO | Source: Ambulatory Visit | Attending: Physician Assistant | Admitting: Physician Assistant

## 2021-07-02 DIAGNOSIS — R221 Localized swelling, mass and lump, neck: Secondary | ICD-10-CM | POA: Diagnosis present

## 2021-07-04 ENCOUNTER — Telehealth: Payer: Self-pay

## 2021-07-04 NOTE — Telephone Encounter (Signed)
-----   Message from Lincoln Brigham, PA-C sent at 07/02/2021  3:02 PM EDT ----- ?Please notify patient that [place] results were negative. ?

## 2021-07-04 NOTE — Telephone Encounter (Signed)
Left 2 messages for pt to call back for test results. ? ?Pt advised via My Chart ?

## 2021-07-08 LAB — JAK2 (INCLUDING V617F AND EXON 12), MPL,& CALR W/RFL MPN PANEL (NGS)

## 2021-07-08 LAB — BCR ABL1 FISH (GENPATH)

## 2021-07-09 ENCOUNTER — Telehealth: Payer: Self-pay | Admitting: Physician Assistant

## 2021-07-09 DIAGNOSIS — D751 Secondary polycythemia: Secondary | ICD-10-CM

## 2021-07-09 NOTE — Telephone Encounter (Signed)
Scheduled per 3/28 secure chat, pt has been called and confirmed  ?

## 2021-07-09 NOTE — Telephone Encounter (Signed)
I called Ms. Renee Li to review the lab results from 06/28/2021. Findings show improvement of polycythemia with Hgb of 15.4. No evidence of myeloproliferative disorder or other bone marrow conditions. Recommend to be evaluated for obstructve sleep apnea so I will make a referral to sleep center.  ? ?Additionally, there is mild hypokalemia with K of 3.2 that has been stable over the last two weeks. Encouraged potassium rich foods and will request PCP to follow up to closely monitor levels.  ? ?We will see patient back in clinic in 6 months. Patient expressed understanding and satisfaction with the plan provided. ?

## 2021-07-09 NOTE — Telephone Encounter (Signed)
Have her to come in for repeat BMP for low potassium in 2 weeks.

## 2021-07-12 ENCOUNTER — Other Ambulatory Visit: Payer: Self-pay

## 2021-07-12 DIAGNOSIS — E876 Hypokalemia: Secondary | ICD-10-CM

## 2021-07-25 ENCOUNTER — Other Ambulatory Visit: Payer: Commercial Managed Care - PPO

## 2021-08-21 ENCOUNTER — Ambulatory Visit (HOSPITAL_BASED_OUTPATIENT_CLINIC_OR_DEPARTMENT_OTHER): Payer: Commercial Managed Care - PPO | Admitting: Orthopaedic Surgery
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
## 2021-10-02 ENCOUNTER — Ambulatory Visit (HOSPITAL_BASED_OUTPATIENT_CLINIC_OR_DEPARTMENT_OTHER): Payer: Commercial Managed Care - PPO | Attending: Neurosurgery | Admitting: Physical Therapy

## 2021-10-02 DIAGNOSIS — M6281 Muscle weakness (generalized): Secondary | ICD-10-CM | POA: Diagnosis present

## 2021-10-02 DIAGNOSIS — M79604 Pain in right leg: Secondary | ICD-10-CM | POA: Diagnosis present

## 2021-10-02 DIAGNOSIS — M5459 Other low back pain: Secondary | ICD-10-CM | POA: Insufficient documentation

## 2021-10-13 NOTE — Therapy (Signed)
OUTPATIENT PHYSICAL THERAPY TREATMENT NOTE   Patient Name: Renee Li MRN: [id number] DOB:1971-04-10, 51 y.o., female Today's Date: 10/14/2021     END OF SESSION:   PT End of Session - 10/14/21 1615     Visit Number 2    Number of Visits 20    Date for PT Re-Evaluation 12/11/21    Authorization Type [employer]    PT Start Time 1603    PT Stop Time 1740    PT Time Calculation (min) 49 min    Activity Tolerance Patient tolerated treatment well    Behavior During Therapy WFL for tasks assessed/performed             Past Medical History:  Diagnosis Date   Anemia    Depression    Heart murmur    never caused any problems   Hypertension    Migraines    Past Surgical History:  Procedure Laterality Date   APPENDECTOMY     WISDOM TOOTH EXTRACTION     Patient Active Problem List   Diagnosis Date Noted   Polycythemia 06/30/2021   Neck fullness 06/30/2021   Essential hypertension 05/28/2018   Class 1 obesity due to excess calories without serious comorbidity with body mass index (BMI) of 30.0 to 30.9 in adult 05/28/2018   Cervicalgia 10/14/2016   Scalp laceration, sequela 07/22/2016   Concussion with loss of consciousness 07/15/2016   Osteoarthritis cervical spine 07/15/2016   Syncope and collapse 07/15/2016   Depression 04/09/2013     REFERRING PROVIDER: Newman Pies, MD    REFERRING DIAG: M51.26 (ICD-10-CM) - Other intervertebral disc displacement, lumbar region ; herniated nucleus pulposus, lumbar   Rationale for Evaluation and Treatment Rehabilitation   THERAPY DIAG:  Other low back pain   Pain in right leg   Muscle weakness (generalized)   ONSET DATE: DOS 07/08/2021   SUBJECTIVE:                                                                                                                                                                                            SUBJECTIVE STATEMENT: Pt is 14 weeks s/p R L1-2 and L2-3 diskectomy on 07/08/2021.   Pt has pain with sitting.  Her worst pain is with prolonged driving.  She has stiffness when getting out of car and has to be careful getting out of her car.  She also has stiffness in R leg.  Pt is limited with performing household chores.  Pt has not returned to work.  Pt states she has B/L/T precautions.  Pt is able to ambulate her normal community distance without significant pain.  Pt performs walking program 4x/wk and has increased her walking program to 3 miles.  She didn't perform walking program as much this past week due to the heat.  Pt states she has < than 4/10 pain with walking program.  Pt denies any adverse effects after prior Rx.  Pt reports compliance with HEP and has some minimal pain with HEP.  Pt reports having pain during the 20 min drive her.       PERTINENT HISTORY:  R L1-2 and L2-3 diskectomy on 07/08/2021 depression   PAIN:  Are you having pain? Yes NPRS:  Current:  4/10, Worst:  6-7/10, Best: 1/10 Location:  R sided lower lumbar.  Pt denies leg pain and N/T.      PRECAUTIONS: Back; Pt has B/L/T precautions   WEIGHT BEARING RESTRICTIONS No   FALLS:  Has patient fallen in last 6 months? 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   PATIENT GOALS to get back to work     OBJECTIVE:    DIAGNOSTIC FINDINGS:  Lumbar MRI on 06/19/21 (prior to surgery): FINDINGS: Segmentation:  Standard.   Alignment:  No significant listhesis.   Vertebrae: Minor loss of height at the superior endplate of L3 eccentric to the right with underlying marrow edema. Vertebral body heights are otherwise maintained. No suspicious osseous lesion.   Conus medullaris and cauda equina: Conus extends to the L1 level. Conus and cauda equina appear normal.   Paraspinal and other soft tissues: Unremarkable.   Disc levels:   T12-L1: Shallow central  protrusion.  No canal or foraminal stenosis.   L1-L2: Right central/subarticular disc protrusion. No canal stenosis. Narrowing of the right subarticular recess with potential compression of right L2 nerve root. No foraminal stenosis.   L2-L3: Right subarticular disc protrusion superimposed on mild disc bulge. No canal stenosis. Effacement of right subarticular recess with compression of right L3 nerve root. There is also a superiorly directed extrusion component of the disc herniation that likely compresses the exiting L2 nerve root. No foraminal stenosis.   L3-L4:  Minimal disc bulge.  No canal or foraminal stenosis.   L4-L5: Minimal disc bulge with a small central extrusion extending just below disc level. Mild facet arthropathy. No canal or foraminal stenosis.   L5-S1:  Mild facet arthropathy.  No canal or foraminal stenosis.   IMPRESSION: Minor loss of height at the superior endplate of L3. Underlying marrow edema may reflect recent compression fracture or may be on a degenerative basis.   Multilevel degenerative changes as detailed above. Disc herniations at at L1-L2 and L2-L3. Compression of the right L3 nerve root at L2-L3. Probable compression of right L2 nerve root at L2-L3. Possible compression of right L2 nerve root at L1-L2.         TODAY'S TREATMENT   SENSATION: 2+ to LT t/o bilat LE dermatomes  -Reviewed current function, response to prior Rx, HEP compliance, and pain levels.   -Pt performed:     Pt was educated in correct form and palpation of TrA contraction.   Pt performed TrA contractions with 5 sec hold x 5 reps.     supine marching with TrA x 10 reps      Supine alt LE extension with TrA 2x10 reps     Supine bridge with TrA 2x10 reps  Supine SLR with TrA x 10 reps bilat     S/L clams 2x10 reps bilat     Supine HS stretch with strap 3x20 sec bilat     Standing rows with TrA and with scap retraction 2x10 with RTB -PT spent time going thru her post  op book that she brought in.  Educated pt with appropriate exercises.     -Pt received a HEP handout and was educated in correct form and appropriate frequency.     PATIENT EDUCATION:  Education details:  educated pt in and reviewed Pt's post op book.  PT answered pt's questions.  HEP, POC, posture, relevant anatomy, dx, and prognosis. Person educated: Patient Education method: Explanation, Demonstration, Tactile cues, Verbal cues, and Handouts Education comprehension: verbalized understanding, returned demonstration, verbal cues required, tactile cues required, and needs further education     HOME EXERCISE PROGRAM: Access Code: [employer] URL: [email or web address] Date: 10/03/2021 Prepared by: Ronny Flurry   Exercises - Supine Transversus Abdominis Bracing - Hands on Stomach  - 2-3 x daily - 7 x weekly - 1-2 sets - 10 reps - 5 seconds hold - Supine March  - 1-2 x daily - 7 x weekly - 1-2 sets - 10 reps Updated HEP: Access Code: [email or web address] URL: [email or web address] Date: 10/14/2021 Prepared by: Ronny Flurry  Exercises - Supine Transversus Abdominis Bracing - Hands on Stomach  - 2-3 x daily - 7 x weekly - 1-2 sets - 10 reps - 5 seconds hold - Supine March  - 1-2 x daily - 7 x weekly - 1-2 sets - 10 reps - Supine Bridge  - 1 x daily - 5 x weekly - 2 sets - 10 reps - Supine Transversus Abdominis Bracing with Leg Extension  - 1 x daily - 7 x weekly - 2 sets - 10 reps - Supine Hamstring Stretch with Strap  - 1-2 x daily - 7 x weekly - 2 reps - 20 seconds hold - Clamshell  - 1 x daily - 5-6 x weekly - 2 sets - 10 reps   ASSESSMENT:   CLINICAL IMPRESSION: Pt is compliant with HEP and walking program though has been unable to do as much walking due to the hot weather.  Reviewed and performed HEP.  Pt demonstrates good form with supine TrA contraction though was holding leg up for 5 seconds with supine marching.  PT educated pt in correct form including to  not hold leg.  PT progressed exercises per protocol.  Pt performed exercises well with cuing and instruction in correct form.  She tolerated progression well without c/o's.  PT updated HEP and gave pt a HEP handout.  Pt responded well to Rx having no increased pain after Rx.  Pt should benefit from skilled PT services per protocol to address impairments and to assist with returning to PLOF.      OBJECTIVE IMPAIRMENTS decreased activity tolerance, decreased endurance, decreased mobility, decreased strength, improper body mechanics, and pain.    ACTIVITY LIMITATIONS carrying, lifting, bending, sitting, standing, squatting, and reach over head   PARTICIPATION LIMITATIONS: meal prep, cleaning, laundry, driving, community activity, and occupation   PERSONAL FACTORS 1 comorbidity: depression  are also affecting patient's functional outcome.    REHAB POTENTIAL: Good   CLINICAL DECISION MAKING: Stable/uncomplicated   EVALUATION COMPLEXITY: Low     GOALS:   SHORT TERM GOALS:    Pt will be independent and compliant with HEP for improved pain, strength, mobility, and function.  Baseline: Goal status: INITIAL Target  date: 10/23/2021   2.  Pt will demo improved core strength as evidenced by progression of core exercises without adverse effects for improved tolerance to daily activities and mobility.  Baseline:  Goal status: INITIAL Target date: 10/30/2021   3.  Pt will demo good control with core exercises for improved neuromuscular control and core strength to perform work activities and functional lifting.  Baseline:  Goal status: INITIAL Target date:  11/13/2021     4.  Pt will cont with walking program and report less pain with walking program.  Baseline:  Goal status: INITIAL Target date:  11/13/2021       LONG TERM GOALS: Target date: 12/11/2021   Pt will demonstrate good form and body mechanics with squat to lift in order to perform functional lifting safely with reduced stress to  lumbar.   Baseline:  Goal status: INITIAL   2.  Pt will demo R LE strength to = L LE strength for performance of functional mobility and to assist with performing occupational activities.  Baseline:  Goal status: INITIAL   3.  Pt will return to work without adverse effects.  Baseline:  Goal status: INITIAL   4.  Pt will be able to perform her functional lifting and carrying as allowed by MD without significant pain and difficulty.  Baseline:  Goal status: INITIAL         PLAN: PT FREQUENCY: 2x/week   PT DURATION:10 weeks   PLANNED INTERVENTIONS: Therapeutic exercises, Therapeutic activity, Neuromuscular re-education, Balance training, Gait training, Patient/Family education, Joint mobilization, Stair training, Aquatic Therapy, Dry Needling, Electrical stimulation, Spinal mobilization, Cryotherapy, Moist heat, Taping, Ultrasound, Manual therapy, and Re-evaluation.   PLAN FOR NEXT SESSION:   Cont with core strengthening and flexibility per protocol.   Selinda Michaels III PT, DPT 10/14/21 5:53 PM

## 2021-10-14 ENCOUNTER — Encounter (HOSPITAL_BASED_OUTPATIENT_CLINIC_OR_DEPARTMENT_OTHER): Payer: Self-pay | Admitting: Physical Therapy

## 2021-10-14 ENCOUNTER — Ambulatory Visit (HOSPITAL_BASED_OUTPATIENT_CLINIC_OR_DEPARTMENT_OTHER): Payer: Commercial Managed Care - PPO | Attending: Neurosurgery | Admitting: Physical Therapy

## 2021-10-14 DIAGNOSIS — M6281 Muscle weakness (generalized): Secondary | ICD-10-CM | POA: Diagnosis present

## 2021-10-14 DIAGNOSIS — M79604 Pain in right leg: Secondary | ICD-10-CM | POA: Diagnosis present

## 2021-10-14 DIAGNOSIS — M5459 Other low back pain: Secondary | ICD-10-CM | POA: Diagnosis present

## 2021-10-16 ENCOUNTER — Ambulatory Visit (HOSPITAL_BASED_OUTPATIENT_CLINIC_OR_DEPARTMENT_OTHER): Payer: Commercial Managed Care - PPO | Admitting: Physical Therapy

## 2021-10-16 ENCOUNTER — Encounter (HOSPITAL_BASED_OUTPATIENT_CLINIC_OR_DEPARTMENT_OTHER): Payer: Self-pay | Admitting: Physical Therapy

## 2021-10-16 DIAGNOSIS — M5459 Other low back pain: Secondary | ICD-10-CM | POA: Diagnosis not present

## 2021-10-16 DIAGNOSIS — M6281 Muscle weakness (generalized): Secondary | ICD-10-CM

## 2021-10-16 DIAGNOSIS — M79604 Pain in right leg: Secondary | ICD-10-CM

## 2021-10-16 NOTE — Therapy (Signed)
OUTPATIENT PHYSICAL THERAPY TREATMENT NOTE   Patient Name: Renee Li MRN: [id number] DOB:06-06-1970, 51 y.o., female Today's Date: 10/16/2021     END OF SESSION:   PT End of Session - 10/16/21 1150     Visit Number 3    Number of Visits 20    Date for PT Re-Evaluation 12/11/21    Authorization Type [employer]    PT Start Time 3664    PT Stop Time 1143    PT Time Calculation (min) 40 min    Activity Tolerance Patient tolerated treatment well    Behavior During Therapy WFL for tasks assessed/performed              Past Medical History:  Diagnosis Date   Anemia    Depression    Heart murmur    never caused any problems   Hypertension    Migraines    Past Surgical History:  Procedure Laterality Date   APPENDECTOMY     WISDOM TOOTH EXTRACTION     Patient Active Problem List   Diagnosis Date Noted   Polycythemia 06/30/2021   Neck fullness 06/30/2021   Essential hypertension 05/28/2018   Class 1 obesity due to excess calories without serious comorbidity with body mass index (BMI) of 30.0 to 30.9 in adult 05/28/2018   Cervicalgia 10/14/2016   Scalp laceration, sequela 07/22/2016   Concussion with loss of consciousness 07/15/2016   Osteoarthritis cervical spine 07/15/2016   Syncope and collapse 07/15/2016   Depression 04/09/2013     REFERRING PROVIDER: Newman Pies, MD    REFERRING DIAG: M51.26 (ICD-10-CM) - Other intervertebral disc displacement, lumbar region ; herniated nucleus pulposus, lumbar   Rationale for Evaluation and Treatment Rehabilitation   THERAPY DIAG:  Other low back pain   Pain in right leg   Muscle weakness (generalized)   ONSET DATE: DOS 07/08/2021   SUBJECTIVE:                                                                                                                                                                                            SUBJECTIVE STATEMENT: Things are going well, but still having pain when I'm  sitting and driving around. I'm glad I'm working on getting everything stronger. Pain tends to be worse when I'm up on my feet and walking a lot. I don't see  my surgeon until August 1st.      PERTINENT HISTORY:  R L1-2 and L2-3 diskectomy on 07/08/2021 depression   PAIN:  Are you having pain? Yes NPRS:  Current:  3/10, Worst:  6-7/10, Best: 1/10 Location:  R sided lower lumbar.  Pt denies leg pain and N/T.      PRECAUTIONS: Back; Pt has B/L/T precautions   WEIGHT BEARING RESTRICTIONS No   FALLS:  Has patient fallen in last 6 months? 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   PATIENT GOALS to get back to work     OBJECTIVE:    DIAGNOSTIC FINDINGS:  Lumbar MRI on 06/19/21 (prior to surgery): FINDINGS: Segmentation:  Standard.   Alignment:  No significant listhesis.   Vertebrae: Minor loss of height at the superior endplate of L3 eccentric to the right with underlying marrow edema. Vertebral body heights are otherwise maintained. No suspicious osseous lesion.   Conus medullaris and cauda equina: Conus extends to the L1 level. Conus and cauda equina appear normal.   Paraspinal and other soft tissues: Unremarkable.   Disc levels:   T12-L1: Shallow central protrusion.  No canal or foraminal stenosis.   L1-L2: Right central/subarticular disc protrusion. No canal stenosis. Narrowing of the right subarticular recess with potential compression of right L2 nerve root. No foraminal stenosis.   L2-L3: Right subarticular disc protrusion superimposed on mild disc bulge. No canal stenosis. Effacement of right subarticular recess with compression of right L3 nerve root. There is also a superiorly directed extrusion component of the disc herniation that likely compresses the exiting L2 nerve root. No foraminal stenosis.   L3-L4:  Minimal  disc bulge.  No canal or foraminal stenosis.   L4-L5: Minimal disc bulge with a small central extrusion extending just below disc level. Mild facet arthropathy. No canal or foraminal stenosis.   L5-S1:  Mild facet arthropathy.  No canal or foraminal stenosis.   IMPRESSION: Minor loss of height at the superior endplate of L3. Underlying marrow edema may reflect recent compression fracture or may be on a degenerative basis.   Multilevel degenerative changes as detailed above. Disc herniations at at L1-L2 and L2-L3. Compression of the right L3 nerve root at L2-L3. Probable compression of right L2 nerve root at L2-L3. Possible compression of right L2 nerve root at L1-L2.         TODAY'S TREATMENT 10/16/21  TrA sets 10x10 second holds  PPT 10 x5 second holds  PPT with march 1x10 B PPT with U LE heel slide (foot touching table) 1x10 B  Bridge with TrA activation 1x15  PPT with SLR 1x10 B Quadruped TrA sets 1x10 3 seconds neutral spine Wall elbow planks 5x10 second holds Standing TA set against door for tactile feedback of neutral spine x10 with 3 second holds  Standing TA set with mini march/neutral spine x10  Standing TA set with alternating hip ABD/neutral spine x10   Hamstring stretches 3x30 seconds B Piriformis stretches 3x30 seconds B     SENSATION     PATIENT EDUCATION:  Education details:  exercise form and purpose,progressions of exercise today  Person educated: Patient Education method: Explanation, Demonstration, Tactile cues, Verbal cues, and Handouts Education comprehension: verbalized understanding, returned demonstration, verbal cues required, tactile cues required, and needs further education     HOME EXERCISE PROGRAM: Access Code: [facility] URL: [email or web address] Date: 10/03/2021 Prepared by: Ronny Flurry   Exercises - Supine Transversus Abdominis Bracing - Hands on Stomach  - 2-3 x daily - 7 x weekly - 1-2 sets - 10 reps - 5 seconds  hold - Supine March  -  1-2 x daily - 7 x weekly - 1-2 sets - 10 reps Updated HEP: Access Code: [email or web address] URL: [email or web address] Date: 10/14/2021 Prepared by: Ronny Flurry  Exercises - Supine Transversus Abdominis Bracing - Hands on Stomach  - 2-3 x daily - 7 x weekly - 1-2 sets - 10 reps - 5 seconds hold - Supine March  - 1-2 x daily - 7 x weekly - 1-2 sets - 10 reps - Supine Bridge  - 1 x daily - 5 x weekly - 2 sets - 10 reps - Supine Transversus Abdominis Bracing with Leg Extension  - 1 x daily - 7 x weekly - 2 sets - 10 reps - Supine Hamstring Stretch with Strap  - 1-2 x daily - 7 x weekly - 2 reps - 20 seconds hold - Clamshell  - 1 x daily - 5-6 x weekly - 2 sets - 10 reps   ASSESSMENT:   CLINICAL IMPRESSION:   Renee Li arrives today doing well, felt fatigued but good after last session. Continued focus on core strengthening today and progressed as able. Definitely has impaired proprioception/kinesthetic awareness of core activation in standing, we will work on this. Will continue to progress as tolerated per protocol.     OBJECTIVE IMPAIRMENTS decreased activity tolerance, decreased endurance, decreased mobility, decreased strength, improper body mechanics, and pain.    ACTIVITY LIMITATIONS carrying, lifting, bending, sitting, standing, squatting, and reach over head   PARTICIPATION LIMITATIONS: meal prep, cleaning, laundry, driving, community activity, and occupation   PERSONAL FACTORS 1 comorbidity: depression  are also affecting patient's functional outcome.    REHAB POTENTIAL: Good   CLINICAL DECISION MAKING: Stable/uncomplicated   EVALUATION COMPLEXITY: Low     GOALS:   SHORT TERM GOALS:    Pt will be independent and compliant with HEP for improved pain, strength, mobility, and function.  Baseline: Goal status: INITIAL Target date: 10/23/2021   2.  Pt will demo improved core strength as evidenced by progression of core exercises without adverse  effects for improved tolerance to daily activities and mobility.  Baseline:  Goal status: INITIAL Target date: 10/30/2021   3.  Pt will demo good control with core exercises for improved neuromuscular control and core strength to perform work activities and functional lifting.  Baseline:  Goal status: INITIAL Target date:  11/13/2021     4.  Pt will cont with walking program and report less pain with walking program.  Baseline:  Goal status: INITIAL Target date:  11/13/2021       LONG TERM GOALS: Target date: 12/11/2021   Pt will demonstrate good form and body mechanics with squat to lift in order to perform functional lifting safely with reduced stress to lumbar.   Baseline:  Goal status: INITIAL   2.  Pt will demo R LE strength to = L LE strength for performance of functional mobility and to assist with performing occupational activities.  Baseline:  Goal status: INITIAL   3.  Pt will return to work without adverse effects.  Baseline:  Goal status: INITIAL   4.  Pt will be able to perform her functional lifting and carrying as allowed by MD without significant pain and difficulty.  Baseline:  Goal status: INITIAL         PLAN: PT FREQUENCY: 2x/week   PT DURATION:10 weeks   PLANNED INTERVENTIONS: Therapeutic exercises, Therapeutic activity, Neuromuscular re-education, Balance training, Gait training, Patient/Family education, Joint mobilization, Stair training, Aquatic Therapy, Dry Needling, Electrical stimulation, Spinal mobilization, Cryotherapy, Moist heat,  Taping, Ultrasound, Manual therapy, and Re-evaluation.   PLAN FOR NEXT SESSION:   Cont with core strengthening and flexibility per protocol.   Khilee Hendricksen U PT DPT PN2  10/16/2021, 11:51 AM

## 2021-10-20 NOTE — Therapy (Signed)
OUTPATIENT PHYSICAL THERAPY TREATMENT NOTE   Patient Name: Renee Li MRN: [id number] DOB:06/01/1970, 51 y.o., female 89 Date: 10/20/2021     END OF SESSION:      Past Medical History:  Diagnosis Date   Anemia    Depression    Heart murmur    never caused any problems   Hypertension    Migraines    Past Surgical History:  Procedure Laterality Date   APPENDECTOMY     WISDOM TOOTH EXTRACTION     Patient Active Problem List   Diagnosis Date Noted   Polycythemia 06/30/2021   Neck fullness 06/30/2021   Essential hypertension 05/28/2018   Class 1 obesity due to excess calories without serious comorbidity with body mass index (BMI) of 30.0 to 30.9 in adult 05/28/2018   Cervicalgia 10/14/2016   Scalp laceration, sequela 07/22/2016   Concussion with loss of consciousness 07/15/2016   Osteoarthritis cervical spine 07/15/2016   Syncope and collapse 07/15/2016   Depression 04/09/2013     REFERRING PROVIDER: Newman Pies, MD    REFERRING DIAG: M51.26 (ICD-10-CM) - Other intervertebral disc displacement, lumbar region ; herniated nucleus pulposus, lumbar   Rationale for Evaluation and Treatment Rehabilitation   THERAPY DIAG:  Other low back pain   Pain in right leg   Muscle weakness (generalized)   ONSET DATE: DOS 07/08/2021   SUBJECTIVE:                                                                                                                                                                                            SUBJECTIVE STATEMENT: Pt is 15 weeks s/p R L1-2, L2-3 diskectomy.  Things are going well, but still having pain when I'm sitting and driving around. I'm glad I'm working on getting everything stronger. Pain tends to be worse when I'm up on my feet and walking a lot. I don't see  my surgeon until August 1st.      PERTINENT HISTORY:  R L1-2 and L2-3 diskectomy on 07/08/2021 depression   PAIN:  Are you having pain? Yes NPRS:   Current:  3/10, Worst:  6-7/10, Best: 1/10 Location:  R sided lower lumbar.  Pt denies leg pain and N/T.      PRECAUTIONS: Back; Pt has B/L/T precautions   WEIGHT BEARING RESTRICTIONS No   FALLS:  Has patient fallen in last 6 months? 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   PATIENT GOALS to get  back to work     OBJECTIVE:    DIAGNOSTIC FINDINGS:  Lumbar MRI on 06/19/21 (prior to surgery): FINDINGS: Segmentation:  Standard.   Alignment:  No significant listhesis.   Vertebrae: Minor loss of height at the superior endplate of L3 eccentric to the right with underlying marrow edema. Vertebral body heights are otherwise maintained. No suspicious osseous lesion.   Conus medullaris and cauda equina: Conus extends to the L1 level. Conus and cauda equina appear normal.   Paraspinal and other soft tissues: Unremarkable.   Disc levels:   T12-L1: Shallow central protrusion.  No canal or foraminal stenosis.   L1-L2: Right central/subarticular disc protrusion. No canal stenosis. Narrowing of the right subarticular recess with potential compression of right L2 nerve root. No foraminal stenosis.   L2-L3: Right subarticular disc protrusion superimposed on mild disc bulge. No canal stenosis. Effacement of right subarticular recess with compression of right L3 nerve root. There is also a superiorly directed extrusion component of the disc herniation that likely compresses the exiting L2 nerve root. No foraminal stenosis.   L3-L4:  Minimal disc bulge.  No canal or foraminal stenosis.   L4-L5: Minimal disc bulge with a small central extrusion extending just below disc level. Mild facet arthropathy. No canal or foraminal stenosis.   L5-S1:  Mild facet arthropathy.  No canal or foraminal stenosis.   IMPRESSION: Minor loss of height at the  superior endplate of L3. Underlying marrow edema may reflect recent compression fracture or may be on a degenerative basis.   Multilevel degenerative changes as detailed above. Disc herniations at at L1-L2 and L2-L3. Compression of the right L3 nerve root at L2-L3. Probable compression of right L2 nerve root at L2-L3. Possible compression of right L2 nerve root at L1-L2.         TODAY'S TREATMENT 10/16/21  TrA sets 10x10 second holds  PPT 10 x5 second holds  PPT with march 1x10 B PPT with U LE heel slide (foot touching table) 1x10 B  Bridge with TrA activation 1x15  PPT with SLR 1x10 B Quadruped TrA sets 1x10 3 seconds neutral spine Wall elbow planks 5x10 second holds Standing TA set against door for tactile feedback of neutral spine x10 with 3 second holds  Standing TA set with mini march/neutral spine x10  Standing TA set with alternating hip ABD/neutral spine x10   Hamstring stretches 3x30 seconds B Piriformis stretches 3x30 seconds B    Pt was educated in correct form and palpation of TrA contraction.   Pt performed TrA contractions with 5 sec hold x 5 reps.     supine marching with TrA x 10 reps      Supine alt LE extension with TrA 2x10 reps     Supine bridge with TrA 2x10 reps     Supine SLR with TrA x 10 reps bilat     S/L clams 2x10 reps bilat     Supine HS stretch with strap 3x20 sec bilat     Standing rows with TrA and with scap retraction 2x10 with RTB  S/L'ing Hip abduction Step ups Heel raises Tandem stance with TrA SLS with TrA Supine alt UE/LE with TrA   SENSATION     PATIENT EDUCATION:  Education details:  exercise form and purpose,progressions of exercise today  Person educated: Patient Education method: Explanation, Demonstration, Tactile cues, Verbal cues, and Handouts Education comprehension: verbalized understanding, returned demonstration, verbal cues required, tactile cues required, and needs further education  HOME EXERCISE  PROGRAM: Access Code: [email or web address] URL: [email or web address] Date: 10/03/2021 Prepared by: Ronny Flurry   Exercises - Supine Transversus Abdominis Bracing - Hands on Stomach  - 2-3 x daily - 7 x weekly - 1-2 sets - 10 reps - 5 seconds hold - Supine March  - 1-2 x daily - 7 x weekly - 1-2 sets - 10 reps Updated HEP: Access Code: [email or web address] URL: [email or web address] Date: 10/14/2021 Prepared by: Ronny Flurry  Exercises - Supine Transversus Abdominis Bracing - Hands on Stomach  - 2-3 x daily - 7 x weekly - 1-2 sets - 10 reps - 5 seconds hold - Supine March  - 1-2 x daily - 7 x weekly - 1-2 sets - 10 reps - Supine Bridge  - 1 x daily - 5 x weekly - 2 sets - 10 reps - Supine Transversus Abdominis Bracing with Leg Extension  - 1 x daily - 7 x weekly - 2 sets - 10 reps - Supine Hamstring Stretch with Strap  - 1-2 x daily - 7 x weekly - 2 reps - 20 seconds hold - Clamshell  - 1 x daily - 5-6 x weekly - 2 sets - 10 reps   ASSESSMENT:   CLINICAL IMPRESSION:   Monaye arrives today doing well, felt fatigued but good after last session. Continued focus on core strengthening today and progressed as able. Definitely has impaired proprioception/kinesthetic awareness of core activation in standing, we will work on this. Will continue to progress as tolerated per protocol.     OBJECTIVE IMPAIRMENTS decreased activity tolerance, decreased endurance, decreased mobility, decreased strength, improper body mechanics, and pain.    ACTIVITY LIMITATIONS carrying, lifting, bending, sitting, standing, squatting, and reach over head   PARTICIPATION LIMITATIONS: meal prep, cleaning, laundry, driving, community activity, and occupation   PERSONAL FACTORS 1 comorbidity: depression  are also affecting patient's functional outcome.    REHAB POTENTIAL: Good   CLINICAL DECISION MAKING: Stable/uncomplicated   EVALUATION COMPLEXITY: Low     GOALS:   SHORT TERM GOALS:    Pt will be  independent and compliant with HEP for improved pain, strength, mobility, and function.  Baseline: Goal status: INITIAL Target date: 10/23/2021   2.  Pt will demo improved core strength as evidenced by progression of core exercises without adverse effects for improved tolerance to daily activities and mobility.  Baseline:  Goal status: INITIAL Target date: 10/30/2021   3.  Pt will demo good control with core exercises for improved neuromuscular control and core strength to perform work activities and functional lifting.  Baseline:  Goal status: INITIAL Target date:  11/13/2021     4.  Pt will cont with walking program and report less pain with walking program.  Baseline:  Goal status: INITIAL Target date:  11/13/2021       LONG TERM GOALS: Target date: 12/11/2021   Pt will demonstrate good form and body mechanics with squat to lift in order to perform functional lifting safely with reduced stress to lumbar.   Baseline:  Goal status: INITIAL   2.  Pt will demo R LE strength to = L LE strength for performance of functional mobility and to assist with performing occupational activities.  Baseline:  Goal status: INITIAL   3.  Pt will return to work without adverse effects.  Baseline:  Goal status: INITIAL   4.  Pt will be able to perform her functional lifting and carrying as allowed by MD without significant pain and difficulty.  Baseline:  Goal status: INITIAL         PLAN: PT FREQUENCY: 2x/week   PT DURATION:10 weeks   PLANNED INTERVENTIONS: Therapeutic exercises, Therapeutic activity, Neuromuscular re-education, Balance training, Gait training, Patient/Family education, Joint mobilization, Stair training, Aquatic Therapy, Dry Needling, Electrical stimulation, Spinal mobilization, Cryotherapy, Moist heat, Taping, Ultrasound, Manual therapy, and Re-evaluation.   PLAN FOR NEXT SESSION:   Cont with core strengthening and flexibility per protocol.   Kristen U PT DPT PN2   10/20/2021, 8:07 AM

## 2021-10-21 ENCOUNTER — Ambulatory Visit (HOSPITAL_BASED_OUTPATIENT_CLINIC_OR_DEPARTMENT_OTHER): Payer: Commercial Managed Care - PPO | Admitting: Physical Therapy

## 2021-10-21 ENCOUNTER — Encounter (HOSPITAL_BASED_OUTPATIENT_CLINIC_OR_DEPARTMENT_OTHER): Payer: Self-pay | Admitting: Physical Therapy

## 2021-10-21 DIAGNOSIS — M5459 Other low back pain: Secondary | ICD-10-CM

## 2021-10-21 DIAGNOSIS — M6281 Muscle weakness (generalized): Secondary | ICD-10-CM

## 2021-10-21 DIAGNOSIS — M79604 Pain in right leg: Secondary | ICD-10-CM

## 2021-10-22 ENCOUNTER — Ambulatory Visit (INDEPENDENT_AMBULATORY_CARE_PROVIDER_SITE_OTHER): Payer: Commercial Managed Care - PPO | Admitting: Nurse Practitioner

## 2021-10-22 ENCOUNTER — Encounter: Payer: Self-pay | Admitting: Nurse Practitioner

## 2021-10-22 VITALS — BP 106/78 | HR 69 | Temp 98.1°F | Ht 63.0 in | Wt 178.8 lb

## 2021-10-22 DIAGNOSIS — F331 Major depressive disorder, recurrent, moderate: Secondary | ICD-10-CM

## 2021-10-22 DIAGNOSIS — E6609 Other obesity due to excess calories: Secondary | ICD-10-CM

## 2021-10-22 DIAGNOSIS — Z6831 Body mass index (BMI) 31.0-31.9, adult: Secondary | ICD-10-CM | POA: Diagnosis not present

## 2021-10-22 DIAGNOSIS — I10 Essential (primary) hypertension: Secondary | ICD-10-CM

## 2021-10-22 MED ORDER — PHENTERMINE HCL 15 MG PO CAPS: 15.0000 mg | ORAL_CAPSULE | ORAL | 1 refills | Status: DC

## 2021-10-22 MED ORDER — BUPROPION HCL ER (XL) 300 MG PO TB24
300.0000 mg | ORAL_TABLET | ORAL | 1 refills | Status: DC
Start: 2021-10-22 — End: 2022-03-20

## 2021-10-22 NOTE — Progress Notes (Signed)
I,Renee Li,acting as a Education administrator for Renee Brine, FNP.,have documented all relevant documentation on the behalf of Renee Brine, FNP,as directed by  Renee Brine, FNP while in the presence of Renee Li, Renee Li.  Subjective:     Patient ID: Renee Li , female    DOB: 1970/11/20 , 51 y.o.   MRN: [id number]   Chief Complaint  Patient presents with   Hypertension    HPI  Pt here for BPC.  She reports being out of work since February 26th, after her back surgery. She has started PT 2 weeks ago and is unsure when she will be going back to work. She sees her surgeon on August 4th.  She does take wellbutrin but she reports life struggles while being out of work, working with what she has for now.  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   Wt Readings from Last 3 Encounters: 10/22/21 : 178 lb 12.8 oz (81.1 kg) 06/28/21 : 173 lb 6.4 oz (78.7 kg) 06/25/21 : 171 lb (77.6 kg)     Hypertension This is a chronic problem. The current episode started more than 1 year ago. The problem is controlled. Pertinent negatives include no anxiety. There are no associated agents to hypertension. Risk factors for coronary artery disease include sedentary lifestyle and obesity. Past treatments include diuretics. There are no compliance problems.  There is no history of angina. There is no history of chronic renal disease.     Past Medical History:  Diagnosis Date   Anemia    Depression    Heart murmur    never caused any problems   Hypertension    Migraines      Family History  Problem Relation Age of Onset   Cancer Mother        throat   Stroke Maternal Grandmother    Diabetes Maternal Grandmother    Cancer Maternal Grandfather        prostate   Heart disease Paternal Grandmother    Colon cancer Neg Hx    Rectal cancer Neg Hx    Stomach cancer Neg Hx    Esophageal cancer Neg Hx    Breast cancer Neg Hx      Current Outpatient  Medications:    aspirin-acetaminophen-caffeine (EXCEDRIN MIGRAINE) 250-250-65 MG per tablet, Take 1 tablet by mouth every 6 (six) hours as needed for headache., Disp: , Rfl:    atenolol (TENORMIN) 50 MG tablet, Take 1 tablet (50 mg total) by mouth daily., Disp: 90 tablet, Rfl: 0   diazepam (VALIUM) 5 MG tablet, Take 1 tablet (5 mg total) by mouth every 12 (twelve) hours as needed for muscle spasms., Disp: 10 tablet, Rfl: 0   fluticasone (FLONASE) 50 MCG/ACT nasal spray, Place 2 sprays into both nostrils daily., Disp: 18.2 mL, Rfl: 2   hydrochlorothiazide (HYDRODIURIL) 25 MG tablet, Take 1 tablet (25 mg total) by mouth daily., Disp: 90 tablet, Rfl: 0   phentermine 15 MG capsule, Take 1 capsule (15 mg total) by mouth every morning., Disp: 30 capsule, Rfl: 1   buPROPion (WELLBUTRIN XL) 300 MG 24 hr tablet, Take 1 tablet (300 mg total) by mouth every morning., Disp: 90 tablet, Rfl: 1   docusate sodium (COLACE) 50 MG capsule, Take 50 mg by mouth 2 (two) times daily. As needed (Patient not taking: Reported on 10/03/2021), Disp: , Rfl:    HYDROcodone-acetaminophen (NORCO/VICODIN) 5-325 MG tablet, Take 1 tablet by mouth every  6 (six) hours. (Patient not taking: Reported on 10/03/2021), Disp: 10 tablet, Rfl: 0   Allergies  Allergen Reactions   Aleve [Naproxen] Hives   Ibuprofen Hives, Itching and Rash     Review of Systems  Constitutional: Negative.   Respiratory: Negative.    Cardiovascular: Negative.   Neurological: Negative.   Psychiatric/Behavioral: Negative.       Today's Vitals   10/22/21 1031  BP: 106/78  Pulse: 69  Temp: 98.1 F (36.7 C)  SpO2: 95%  Weight: 178 lb 12.8 oz (81.1 kg)  Height: _0  (1.6 m)  PainSc: 0-No pain   Body mass index is 31.67 kg/m.  Wt Readings from Last 3 Encounters:  10/22/21 178 lb 12.8 oz (81.1 kg)  06/28/21 173 lb 6.4 oz (78.7 kg)  06/25/21 171 lb (77.6 kg)    Objective:  Physical Exam Vitals reviewed.  Constitutional:      General: She is not  in acute distress.    Appearance: Normal appearance.  Cardiovascular:     Rate and Rhythm: Normal rate and regular rhythm.     Pulses: Normal pulses.     Heart sounds: Normal heart sounds. No murmur heard. Pulmonary:     Effort: Pulmonary effort is normal. No respiratory distress.     Breath sounds: Normal breath sounds. No wheezing.  Neurological:     General: No focal deficit present.     Mental Status: She is alert and oriented to person, place, and time.     Cranial Nerves: No cranial nerve deficit.     Motor: No weakness.  Psychiatric:        Behavior: Behavior normal.        Thought Content: Thought content normal.        Judgment: Judgment normal.     Comments: Sad appearance         Assessment And Plan:     1. Essential hypertension Blood pressure is normal, continue current medications. - BMP8+eGFR  2. Moderate episode of recurrent major depressive disorder ([place]) Comments: Depression screen up to 14 will increase wellbutrin to 300 mg. Denies suicidal and homicidal ideations - buPROPion (WELLBUTRIN XL) 300 MG 24 hr tablet; Take 1 tablet (300 mg total) by mouth every morning.  Dispense: 90 tablet; Refill: 1  3. Class 1 obesity due to excess calories without serious comorbidity with body mass index (BMI) of 31.0 to 31.9 in adult Comments: Will restart phentermine 15 mg for 2 months at this time. Encouraged to walk more based on restrictions by back surgeon.  She is encouraged to strive for BMI less than 30 to decrease cardiac risk. Advised to aim for at least 150 minutes of exercise per week.  - phentermine 15 MG capsule; Take 1 capsule (15 mg total) by mouth every morning.  Dispense: 30 capsule; Refill: 1   Patient was given opportunity to ask questions. Patient verbalized understanding of the plan and was able to repeat key elements of the plan. All questions were answered to their satisfaction.  Renee Brine, FNP   I, Renee Brine, FNP, have reviewed all  documentation for this visit. The documentation on 10/22/21 for the exam, diagnosis, procedures, and orders are all accurate and complete.   IF YOU HAVE BEEN REFERRED TO A SPECIALIST, IT MAY TAKE 1-2 WEEKS TO SCHEDULE/PROCESS THE REFERRAL. IF YOU HAVE NOT HEARD FROM US/SPECIALIST IN TWO WEEKS, PLEASE GIVE [place] A CALL AT [phone].   THE PATIENT IS ENCOURAGED TO PRACTICE SOCIAL DISTANCING  DUE TO THE COVID-19 PANDEMIC.

## 2021-10-22 NOTE — Patient Instructions (Signed)
Hypertension, Adult ?Hypertension is another name for high blood pressure. High blood pressure forces your heart to work harder to pump blood. This can cause problems over time. ?There are two numbers in a blood pressure reading. There is a top number (systolic) over a bottom number (diastolic). It is best to have a blood pressure that is below 120/80. ?What are the causes? ?The cause of this condition is not known. Some other conditions can lead to high blood pressure. ?What increases the risk? ?Some lifestyle factors can make you more likely to develop high blood pressure: ?Smoking. ?Not getting enough exercise or physical activity. ?Being overweight. ?Having too much fat, sugar, calories, or salt (sodium) in your diet. ?Drinking too much alcohol. ?Other risk factors include: ?Having any of these conditions: ?Heart disease. ?Diabetes. ?High cholesterol. ?Kidney disease. ?Obstructive sleep apnea. ?Having a family history of high blood pressure and high cholesterol. ?Age. The risk increases with age. ?Stress. ?What are the signs or symptoms? ?High blood pressure may not cause symptoms. Very high blood pressure (hypertensive crisis) may cause: ?Headache. ?Fast or uneven heartbeats (palpitations). ?Shortness of breath. ?Nosebleed. ?Vomiting or feeling like you may vomit (nauseous). ?Changes in how you see. ?Very bad chest pain. ?Feeling dizzy. ?Seizures. ?How is this treated? ?This condition is treated by making healthy lifestyle changes, such as: ?Eating healthy foods. ?Exercising more. ?Drinking less alcohol. ?Your doctor may prescribe medicine if lifestyle changes do not help enough and if: ?Your top number is above 130. ?Your bottom number is above 80. ?Your personal target blood pressure may vary. ?Follow these instructions at home: ?Eating and drinking ? ?If told, follow the DASH eating plan. To follow this plan: ?Fill one half of your plate at each meal with fruits and vegetables. ?Fill one fourth of your plate  at each meal with whole grains. Whole grains include whole-wheat pasta, brown rice, and whole-grain bread. ?Eat or drink low-fat dairy products, such as skim milk or low-fat yogurt. ?Fill one fourth of your plate at each meal with low-fat (lean) proteins. Low-fat proteins include fish, chicken without skin, eggs, beans, and tofu. ?Avoid fatty meat, cured and processed meat, or chicken with skin. ?Avoid pre-made or processed food. ?Limit the amount of salt in your diet to less than 1,500 mg each day. ?Do not drink alcohol if: ?Your doctor tells you not to drink. ?You are pregnant, may be pregnant, or are planning to become pregnant. ?If you drink alcohol: ?Limit how much you have to: ?0-1 drink a day for women. ?0-2 drinks a day for men. ?Know how much alcohol is in your drink. In the [place] one drink equals one 12 oz bottle of beer (355 mL), one 5 oz glass of wine (148 mL), or one 1? oz glass of hard liquor (44 mL). ?Lifestyle ? ?Work with your doctor to stay at a healthy weight or to lose weight. Ask your doctor what the best weight is for you. ?Get at least 30 minutes of exercise that causes your heart to beat faster (aerobic exercise) most days of the week. This may include walking, swimming, or biking. ?Get at least 30 minutes of exercise that strengthens your muscles (resistance exercise) at least 3 days a week. This may include lifting weights or doing Pilates. ?Do not smoke or use any products that contain nicotine or tobacco. If you need help quitting, ask your doctor. ?Check your blood pressure at home as told by your doctor. ?Keep all follow-up visits. ?Medicines ?Take over-the-counter and prescription medicines   only as told by your doctor. Follow directions carefully. ?Do not skip doses of blood pressure medicine. The medicine does not work as well if you skip doses. Skipping doses also puts you at risk for problems. ?Ask your doctor about side effects or reactions to medicines that you should watch  for. ?Contact a doctor if: ?You think you are having a reaction to the medicine you are taking. ?You have headaches that keep coming back. ?You feel dizzy. ?You have swelling in your ankles. ?You have trouble with your vision. ?Get help right away if: ?You get a very bad headache. ?You start to feel mixed up (confused). ?You feel weak or numb. ?You feel faint. ?You have very bad pain in your: ?Chest. ?Belly (abdomen). ?You vomit more than once. ?You have trouble breathing. ?These symptoms may be an emergency. Get help right away. Call 911. ?Do not wait to see if the symptoms will go away. ?Do not drive yourself to the hospital. ?Summary ?Hypertension is another name for high blood pressure. ?High blood pressure forces your heart to work harder to pump blood. ?For most people, a normal blood pressure is less than 120/80. ?Making healthy choices can help lower blood pressure. If your blood pressure does not get lower with healthy choices, you may need to take medicine. ?This information is not intended to replace advice given to you by your health care provider. Make sure you discuss any questions you have with your health care provider. ?Document Revised: 01/17/2021 Document Reviewed: 01/17/2021 ?Elsevier Patient Education ? 2023 [employer] ? ?

## 2021-10-22 NOTE — Therapy (Signed)
OUTPATIENT PHYSICAL THERAPY TREATMENT NOTE   Patient Name: Renee Li MRN: [id number] DOB:[birth year], 51 y.o., female Today's Date: 10/24/2021     END OF SESSION:   PT End of Session - 10/23/21 1024     Visit Number 5    Number of Visits 20    Date for PT Re-Evaluation 12/11/21    Authorization Type [employer]    PT Start Time 1020    PT Stop Time 1100    PT Time Calculation (min) 40 min    Activity Tolerance Patient tolerated treatment well    Behavior During Therapy WFL for tasks assessed/performed                Past Medical History:  Diagnosis Date   Anemia    Depression    Heart murmur    never caused any problems   Hypertension    Migraines    Past Surgical History:  Procedure Laterality Date   APPENDECTOMY     WISDOM TOOTH EXTRACTION     Patient Active Problem List   Diagnosis Date Noted   Polycythemia 06/30/2021   Neck fullness 06/30/2021   Essential hypertension 05/28/2018   Class 1 obesity due to excess calories without serious comorbidity with body mass index (BMI) of 30.0 to 30.9 in adult 05/28/2018   Cervicalgia 10/14/2016   Scalp laceration, sequela 07/22/2016   Concussion with loss of consciousness 07/15/2016   Osteoarthritis cervical spine 07/15/2016   Syncope and collapse 07/15/2016   Depression 04/09/2013     REFERRING PROVIDER: Newman Pies, MD    REFERRING DIAG: M51.26 (ICD-10-CM) - Other intervertebral disc displacement, lumbar region ; herniated nucleus pulposus, lumbar   Rationale for Evaluation and Treatment Rehabilitation   THERAPY DIAG:  Other low back pain   Pain in right leg   Muscle weakness (generalized)   ONSET DATE: DOS 07/08/2021   SUBJECTIVE:                                                                                                                                                                                            SUBJECTIVE STATEMENT: Pt is 15 weeks and 2 days s/p R L1-2, L2-3  diskectomy.  Pt states she is doing well and is improving.  Pt felt good while sitting and driving here this AM.  Pt used a small stool and was able to perform planting in her herb garden.  She had no increased pain with planting in her garden.  Pt reports compliance with HEP.  Pt is walking 3 miles 3-4x/wk.  Pt is feeling better with doing things around her house.  She  has pain with sitting and driving.  Pt states she could feel a pinch after prior Rx which went away and had no increased pain later.       PERTINENT HISTORY:  R L1-2 and L2-3 diskectomy on 07/08/2021 depression   PAIN:  Are you having pain? Yes NPRS:  Current:  0/10, Worst:  6-7/10, Best: 1/10 Location:  R sided lower lumbar.  Pt denies leg pain and N/T.      PRECAUTIONS: Back; Pt has B/L/T precautions   WEIGHT BEARING RESTRICTIONS No   FALLS:  Has patient fallen in last 6 months? 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   PATIENT GOALS to get back to work     OBJECTIVE:    DIAGNOSTIC FINDINGS:  Lumbar MRI on 06/19/21 (prior to surgery): FINDINGS: Segmentation:  Standard.   Alignment:  No significant listhesis.   Vertebrae: Minor loss of height at the superior endplate of L3 eccentric to the right with underlying marrow edema. Vertebral body heights are otherwise maintained. No suspicious osseous lesion.   Conus medullaris and cauda equina: Conus extends to the L1 level. Conus and cauda equina appear normal.   Paraspinal and other soft tissues: Unremarkable.   Disc levels:   T12-L1: Shallow central protrusion.  No canal or foraminal stenosis.   L1-L2: Right central/subarticular disc protrusion. No canal stenosis. Narrowing of the right subarticular recess with potential compression of right L2 nerve root. No foraminal stenosis.   L2-L3: Right subarticular disc  protrusion superimposed on mild disc bulge. No canal stenosis. Effacement of right subarticular recess with compression of right L3 nerve root. There is also a superiorly directed extrusion component of the disc herniation that likely compresses the exiting L2 nerve root. No foraminal stenosis.   L3-L4:  Minimal disc bulge.  No canal or foraminal stenosis.   L4-L5: Minimal disc bulge with a small central extrusion extending just below disc level. Mild facet arthropathy. No canal or foraminal stenosis.   L5-S1:  Mild facet arthropathy.  No canal or foraminal stenosis.   IMPRESSION: Minor loss of height at the superior endplate of L3. Underlying marrow edema may reflect recent compression fracture or may be on a degenerative basis.   Multilevel degenerative changes as detailed above. Disc herniations at at L1-L2 and L2-L3. Compression of the right L3 nerve root at L2-L3. Probable compression of right L2 nerve root at L2-L3. Possible compression of right L2 nerve root at L1-L2.         TODAY'S TREATMENT 10/16/21 Therapeutic Exercise: Reviewed current function, pain level, HEP compliance, and response to prior Rx.  Pt performed:  Ambulated on TM with UE support 1.5 - 2.5 MPH x 6 mins Supine alt UE/LE with TrA 2x10 reps  Supine alt LE extension with TrA 2x10 reps  Supine bridge with TrA 2x10 reps  S/L'ing Hip abduction with TrA 2x10 reps S/L Clams with Tra 2x10 reps  Supine SLR with TrA 1x10 reps Supine HS stretch with strap 2x30 sec bilat Standing Heel raises with TrA 2x10 reps Step ups with TrA on 6 inch step x 10 reps each leg Standing rows with TrA and with scap retraction 2x10 with RTB   PATIENT EDUCATION:  Education details:  exercise form and purpose, progressions of exercise today  Person educated: Patient Education method: Explanation, Demonstration, Tactile  cues, Verbal cues, and Handouts Education comprehension: verbalized understanding, returned demonstration,  verbal cues required, tactile cues required, and needs further education     HOME EXERCISE PROGRAM: Access Code: [facility] URL: [email or web address] Date: 10/03/2021 Prepared by: Ronny Flurry   Exercises - Supine Transversus Abdominis Bracing - Hands on Stomach  - 2-3 x daily - 7 x weekly - 1-2 sets - 10 reps - 5 seconds hold - Supine March  - 1-2 x daily - 7 x weekly - 1-2 sets - 10 reps Updated HEP: Access Code: [email or web address] URL: [email or web address] Date: 10/14/2021 Prepared by: Ronny Flurry  Exercises - Supine Transversus Abdominis Bracing - Hands on Stomach  - 2-3 x daily - 7 x weekly - 1-2 sets - 10 reps - 5 seconds hold - Supine March  - 1-2 x daily - 7 x weekly - 1-2 sets - 10 reps - Supine Bridge  - 1 x daily - 5 x weekly - 2 sets - 10 reps - Supine Transversus Abdominis Bracing with Leg Extension  - 1 x daily - 7 x weekly - 2 sets - 10 reps - Supine Hamstring Stretch with Strap  - 1-2 x daily - 7 x weekly - 2 reps - 20 seconds hold - Clamshell  - 1 x daily - 5-6 x weekly - 2 sets - 10 reps   ASSESSMENT:   CLINICAL IMPRESSION: Pt is progressing well with core strength, function, and protocol.  She demonstrates improved function and pain as evidenced by subjective reports including improved household activities and not having pain driving here today.  Pt continues to perform her walking program.  Pt performed exercises per protocol well and had good tolerance with exercises. She responded well to Rx having no pain and no pinching sensation after Rx.  Pt should benefit from cont skilled PT services to address ongoing goals and to restore desired level of function.         OBJECTIVE IMPAIRMENTS decreased activity tolerance, decreased endurance, decreased mobility, decreased strength, improper body mechanics, and pain.    ACTIVITY LIMITATIONS carrying, lifting, bending, sitting, standing, squatting, and reach over head   PARTICIPATION LIMITATIONS:  meal prep, cleaning, laundry, driving, community activity, and occupation   PERSONAL FACTORS 1 comorbidity: depression  are also affecting patient's functional outcome.    REHAB POTENTIAL: Good   CLINICAL DECISION MAKING: Stable/uncomplicated   EVALUATION COMPLEXITY: Low     GOALS:   SHORT TERM GOALS:    Pt will be independent and compliant with HEP for improved pain, strength, mobility, and function.  Baseline: Goal status: INITIAL Target date: 10/23/2021   2.  Pt will demo improved core strength as evidenced by progression of core exercises without adverse effects for improved tolerance to daily activities and mobility.  Baseline:  Goal status: INITIAL Target date: 10/30/2021   3.  Pt will demo good control with core exercises for improved neuromuscular control and core strength to perform work activities and functional lifting.  Baseline:  Goal status: INITIAL Target date:  11/13/2021     4.  Pt will cont with walking program and report less pain with walking program.  Baseline:  Goal status: INITIAL Target date:  11/13/2021       LONG TERM GOALS: Target date: 12/11/2021   Pt will demonstrate good form and body mechanics with squat to lift in order to perform functional lifting safely with reduced stress to lumbar.   Baseline:  Goal status: INITIAL   2.  Pt will demo R  LE strength to = L LE strength for performance of functional mobility and to assist with performing occupational activities.  Baseline:  Goal status: INITIAL   3.  Pt will return to work without adverse effects.  Baseline:  Goal status: INITIAL   4.  Pt will be able to perform her functional lifting and carrying as allowed by MD without significant pain and difficulty.  Baseline:  Goal status: INITIAL         PLAN: PT FREQUENCY: 2x/week   PT DURATION:10 weeks   PLANNED INTERVENTIONS: Therapeutic exercises, Therapeutic activity, Neuromuscular re-education, Balance training, Gait training,  Patient/Family education, Joint mobilization, Stair training, Aquatic Therapy, Dry Needling, Electrical stimulation, Spinal mobilization, Cryotherapy, Moist heat, Taping, Ultrasound, Manual therapy, and Re-evaluation.   PLAN FOR NEXT SESSION:   Cont with core strengthening and flexibility per protocol.  Add SLS with TrA next visit.  Return to MD on 11/12/2021   Selinda Michaels III PT, DPT 10/24/21 10:36 AM

## 2021-10-23 ENCOUNTER — Ambulatory Visit (HOSPITAL_BASED_OUTPATIENT_CLINIC_OR_DEPARTMENT_OTHER): Payer: Commercial Managed Care - PPO | Admitting: Physical Therapy

## 2021-10-23 ENCOUNTER — Encounter (HOSPITAL_BASED_OUTPATIENT_CLINIC_OR_DEPARTMENT_OTHER): Payer: Self-pay | Admitting: Physical Therapy

## 2021-10-23 DIAGNOSIS — M5459 Other low back pain: Secondary | ICD-10-CM | POA: Diagnosis not present

## 2021-10-23 DIAGNOSIS — M6281 Muscle weakness (generalized): Secondary | ICD-10-CM

## 2021-10-23 DIAGNOSIS — M79604 Pain in right leg: Secondary | ICD-10-CM

## 2021-10-23 LAB — BMP8+EGFR
BUN/Creatinine Ratio: 15 (ref 9–23)
BUN: 17 mg/dL (ref 6–24)
CO2: 26 mmol/L (ref 20–29)
Calcium: 10.4 mg/dL — ABNORMAL HIGH (ref 8.7–10.2)
Chloride: 102 mmol/L (ref 96–106)
Creatinine, Ser: 1.11 mg/dL — ABNORMAL HIGH (ref 0.57–1.00)
Glucose: 93 mg/dL (ref 70–99)
Potassium: 4 mmol/L (ref 3.5–5.2)
Sodium: 144 mmol/L (ref 134–144)
eGFR: 59 mL/min/{1.73_m2} — ABNORMAL LOW (ref >59)

## 2021-10-27 NOTE — Therapy (Incomplete)
OUTPATIENT PHYSICAL THERAPY TREATMENT NOTE   Patient Name: Renee Li MRN: [id number] DOB:1971-03-07, 51 y.o., female Today's Date: 10/28/2021     END OF SESSION:   PT End of Session - 10/28/21 1027     Visit Number 6    Number of Visits 20    Date for PT Re-Evaluation 12/11/21    Authorization Type [employer]    PT Start Time 1019    PT Stop Time 1101    PT Time Calculation (min) 42 min    Activity Tolerance Patient tolerated treatment well    Behavior During Therapy WFL for tasks assessed/performed                 Past Medical History:  Diagnosis Date   Anemia    Depression    Heart murmur    never caused any problems   Hypertension    Migraines    Past Surgical History:  Procedure Laterality Date   APPENDECTOMY     WISDOM TOOTH EXTRACTION     Patient Active Problem List   Diagnosis Date Noted   Polycythemia 06/30/2021   Neck fullness 06/30/2021   Essential hypertension 05/28/2018   Class 1 obesity due to excess calories without serious comorbidity with body mass index (BMI) of 30.0 to 30.9 in adult 05/28/2018   Cervicalgia 10/14/2016   Scalp laceration, sequela 07/22/2016   Concussion with loss of consciousness 07/15/2016   Osteoarthritis cervical spine 07/15/2016   Syncope and collapse 07/15/2016   Depression 04/09/2013     REFERRING PROVIDER: Newman Pies, MD    REFERRING DIAG: M51.26 (ICD-10-CM) - Other intervertebral disc displacement, lumbar region ; herniated nucleus pulposus, lumbar   Rationale for Evaluation and Treatment Rehabilitation   THERAPY DIAG:  Other low back pain   Pain in right leg   Muscle weakness (generalized)   ONSET DATE: DOS 07/08/2021   SUBJECTIVE:                                                                                                                                                                                            SUBJECTIVE STATEMENT: Pt is 16 weeks s/p R L1-2, L2-3 diskectomy.  Pt  states she is doing well and is improving.  Pt denies any adverse effects after prior Rx.  Pt states she did more gardening this weekend and had minimal pain in R LE.   Pt reports compliance with HEP.  She has been consistent with walking program and decreased her walking to 2 miles this week.  Pt states her niece was over and she didn't pick her up.  Pt is limited  with lifting due to MD restrictions and surgical protocol.     PERTINENT HISTORY:  R L1-2 and L2-3 diskectomy on 07/08/2021 depression   PAIN:  Are you having pain? Yes NPRS:  Current:  0/10, Worst:  6-7/10, Best: 1/10 Location:  R sided lower lumbar.  Pt denies leg pain and N/T.      PRECAUTIONS: Back; Pt has B/L/T precautions   WEIGHT BEARING RESTRICTIONS No   FALLS:  Has patient fallen in last 6 months? 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   PATIENT GOALS to get back to work     OBJECTIVE:    DIAGNOSTIC FINDINGS:  Lumbar MRI on 06/19/21 (prior to surgery): FINDINGS: Segmentation:  Standard.   Alignment:  No significant listhesis.   Vertebrae: Minor loss of height at the superior endplate of L3 eccentric to the right with underlying marrow edema. Vertebral body heights are otherwise maintained. No suspicious osseous lesion.   Conus medullaris and cauda equina: Conus extends to the L1 level. Conus and cauda equina appear normal.   Paraspinal and other soft tissues: Unremarkable.   Disc levels:   T12-L1: Shallow central protrusion.  No canal or foraminal stenosis.   L1-L2: Right central/subarticular disc protrusion. No canal stenosis. Narrowing of the right subarticular recess with potential compression of right L2 nerve root. No foraminal stenosis.   L2-L3: Right subarticular disc protrusion superimposed on mild disc bulge. No canal stenosis. Effacement of  right subarticular recess with compression of right L3 nerve root. There is also a superiorly directed extrusion component of the disc herniation that likely compresses the exiting L2 nerve root. No foraminal stenosis.   L3-L4:  Minimal disc bulge.  No canal or foraminal stenosis.   L4-L5: Minimal disc bulge with a small central extrusion extending just below disc level. Mild facet arthropathy. No canal or foraminal stenosis.   L5-S1:  Mild facet arthropathy.  No canal or foraminal stenosis.   IMPRESSION: Minor loss of height at the superior endplate of L3. Underlying marrow edema may reflect recent compression fracture or may be on a degenerative basis.   Multilevel degenerative changes as detailed above. Disc herniations at at L1-L2 and L2-L3. Compression of the right L3 nerve root at L2-L3. Probable compression of right L2 nerve root at L2-L3. Possible compression of right L2 nerve root at L1-L2.         TODAY'S TREATMENT 10/16/21 Therapeutic Exercise: Reviewed current function, pain level, HEP compliance, and response to prior Rx.  Pt performed:  Ambulated on TM with UE support 1.5 - 2.7 MPH x 6 mins Supine alt UE/LE with TrA 2x10 reps  Supine alt LE extension with TrA 2x10 reps  S/L'ing Hip abduction with TrA 2x10 reps S/L Clams with Tra 2x10 reps  Supine SLR with TrA 2x10 reps Supine HS stretch with strap 2x30 sec bilat Standing Heel raises with TrA 2x10 reps Step ups with TrA on 6 inch step x 10 reps each leg Standing rows with TrA and with scap retraction 2x10 with GTB SLS with TrA 2x10 sec without UE support Marching on airex with TrA 2x10 without UE support    PATIENT EDUCATION:  Education details:  exercise form and purpose, progressions of exercise today  Person educated: Patient Education method: Explanation, Demonstration, Tactile cues, Verbal cues, and Handouts Education comprehension: verbalized  understanding, returned demonstration, verbal cues required,  tactile cues required, and needs further education     HOME EXERCISE PROGRAM: Access Code: [place] URL: [email or web address] Date: 10/03/2021 Prepared by: Ronny Flurry   Exercises - Supine Transversus Abdominis Bracing - Hands on Stomach  - 2-3 x daily - 7 x weekly - 1-2 sets - 10 reps - 5 seconds hold - Supine March  - 1-2 x daily - 7 x weekly - 1-2 sets - 10 reps Updated HEP: Access Code: [email or web address] URL: [email or web address] Date: 10/14/2021 Prepared by: Ronny Flurry  Exercises - Supine Transversus Abdominis Bracing - Hands on Stomach  - 2-3 x daily - 7 x weekly - 1-2 sets - 10 reps - 5 seconds hold - Supine March  - 1-2 x daily - 7 x weekly - 1-2 sets - 10 reps - Supine Bridge  - 1 x daily - 5 x weekly - 2 sets - 10 reps - Supine Transversus Abdominis Bracing with Leg Extension  - 1 x daily - 7 x weekly - 2 sets - 10 reps - Supine Hamstring Stretch with Strap  - 1-2 x daily - 7 x weekly - 2 reps - 20 seconds hold - Clamshell  - 1 x daily - 5-6 x weekly - 2 sets - 10 reps   ASSESSMENT:   CLINICAL IMPRESSION: Pt is progressing well with core strength, function, and protocol.  She presents to Rx reporting no pain.  Pt performed exercises per protocol well and had good tolerance with exercises.  PT progressed exercises with adding proprioceptive activities and pt was able to perform without UE support.  She responded well to Rx having no pain though does report fatigue after Rx.  Pt should benefit from cont skilled PT services to address ongoing goals and to restore desired level of function.           OBJECTIVE IMPAIRMENTS decreased activity tolerance, decreased endurance, decreased mobility, decreased strength, improper body mechanics, and pain.    ACTIVITY LIMITATIONS carrying, lifting, bending, sitting, standing, squatting, and reach over head   PARTICIPATION LIMITATIONS: meal prep, cleaning, laundry, driving, community activity, and occupation    PERSONAL FACTORS 1 comorbidity: depression  are also affecting patient's functional outcome.    REHAB POTENTIAL: Good   CLINICAL DECISION MAKING: Stable/uncomplicated   EVALUATION COMPLEXITY: Low     GOALS:   SHORT TERM GOALS:    Pt will be independent and compliant with HEP for improved pain, strength, mobility, and function.  Baseline: Goal status: INITIAL Target date: 10/23/2021   2.  Pt will demo improved core strength as evidenced by progression of core exercises without adverse effects for improved tolerance to daily activities and mobility.  Baseline:  Goal status: INITIAL Target date: 10/30/2021   3.  Pt will demo good control with core exercises for improved neuromuscular control and core strength to perform work activities and functional lifting.  Baseline:  Goal status: INITIAL Target date:  11/13/2021     4.  Pt will cont with walking program and report less pain with walking program.  Baseline:  Goal status: INITIAL Target date:  11/13/2021       LONG TERM GOALS: Target date: 12/11/2021   Pt will demonstrate good form and body mechanics with squat to lift in order to perform functional lifting safely with reduced stress to lumbar.   Baseline:  Goal status: INITIAL   2.  Pt will demo R LE strength to = L LE strength for performance of functional  mobility and to assist with performing occupational activities.  Baseline:  Goal status: INITIAL   3.  Pt will return to work without adverse effects.  Baseline:  Goal status: INITIAL   4.  Pt will be able to perform her functional lifting and carrying as allowed by MD without significant pain and difficulty.  Baseline:  Goal status: INITIAL         PLAN: PT FREQUENCY: 2x/week   PT DURATION:10 weeks   PLANNED INTERVENTIONS: Therapeutic exercises, Therapeutic activity, Neuromuscular re-education, Balance training, Gait training, Patient/Family education, Joint mobilization, Stair training, Aquatic Therapy,  Dry Needling, Electrical stimulation, Spinal mobilization, Cryotherapy, Moist heat, Taping, Ultrasound, Manual therapy, and Re-evaluation.   PLAN FOR NEXT SESSION:   Cont with core strengthening and flexibility per protocol.  Return to MD on 11/12/2021   Selinda Michaels III PT, DPT 10/28/21 11:15 AM

## 2021-10-28 ENCOUNTER — Ambulatory Visit (HOSPITAL_BASED_OUTPATIENT_CLINIC_OR_DEPARTMENT_OTHER): Payer: Commercial Managed Care - PPO | Admitting: Physical Therapy

## 2021-10-28 ENCOUNTER — Other Ambulatory Visit: Payer: Self-pay | Admitting: Nurse Practitioner

## 2021-10-28 ENCOUNTER — Encounter (HOSPITAL_BASED_OUTPATIENT_CLINIC_OR_DEPARTMENT_OTHER): Payer: Self-pay | Admitting: Physical Therapy

## 2021-10-28 DIAGNOSIS — M6281 Muscle weakness (generalized): Secondary | ICD-10-CM

## 2021-10-28 DIAGNOSIS — M79604 Pain in right leg: Secondary | ICD-10-CM

## 2021-10-28 DIAGNOSIS — M5459 Other low back pain: Secondary | ICD-10-CM

## 2021-10-28 DIAGNOSIS — I10 Essential (primary) hypertension: Secondary | ICD-10-CM

## 2021-10-28 MED ORDER — HYDROCHLOROTHIAZIDE 25 MG PO TABS: 25.0000 mg | ORAL_TABLET | Freq: Every day | ORAL | 0 refills | Status: DC

## 2021-10-30 ENCOUNTER — Encounter (HOSPITAL_BASED_OUTPATIENT_CLINIC_OR_DEPARTMENT_OTHER): Payer: Self-pay | Admitting: Physical Therapy

## 2021-10-30 ENCOUNTER — Ambulatory Visit (HOSPITAL_BASED_OUTPATIENT_CLINIC_OR_DEPARTMENT_OTHER): Payer: Commercial Managed Care - PPO | Admitting: Physical Therapy

## 2021-10-30 DIAGNOSIS — M6281 Muscle weakness (generalized): Secondary | ICD-10-CM

## 2021-10-30 DIAGNOSIS — M79604 Pain in right leg: Secondary | ICD-10-CM

## 2021-10-30 DIAGNOSIS — M5459 Other low back pain: Secondary | ICD-10-CM | POA: Diagnosis not present

## 2021-10-30 NOTE — Therapy (Signed)
OUTPATIENT PHYSICAL THERAPY TREATMENT NOTE   Patient Name: Renee Li MRN: [id number] DOB:Apr 17, 1970, 51 y.o.,, female Today's Date: 10/30/2021     END OF SESSION:   PT End of Session - 10/30/21 1111     Visit Number 7    Number of Visits 20    Date for PT Re-Evaluation 12/11/21    Authorization Type [employer]    PT Start Time 1020    PT Stop Time 1104    PT Time Calculation (min) 44 min    Activity Tolerance Patient tolerated treatment well    Behavior During Therapy WFL for tasks assessed/performed                  Past Medical History:  Diagnosis Date   Anemia    Depression    Heart murmur    never caused any problems   Hypertension    Migraines    Past Surgical History:  Procedure Laterality Date   APPENDECTOMY     WISDOM TOOTH EXTRACTION     Patient Active Problem List   Diagnosis Date Noted   Polycythemia 06/30/2021   Neck fullness 06/30/2021   Essential hypertension 05/28/2018   Class 1 obesity due to excess calories without serious comorbidity with body mass index (BMI) of 30.0 to 30.9 in adult 05/28/2018   Cervicalgia 10/14/2016   Scalp laceration, sequela 07/22/2016   Concussion with loss of consciousness 07/15/2016   Osteoarthritis cervical spine 07/15/2016   Syncope and collapse 07/15/2016   Depression 04/09/2013     REFERRING PROVIDER: Newman Pies, MD    REFERRING DIAG: M51.26 (ICD-10-CM) - Other intervertebral disc displacement, lumbar region ; herniated nucleus pulposus, lumbar   Rationale for Evaluation and Treatment Rehabilitation   THERAPY DIAG:  Other low back pain   Pain in right leg   Muscle weakness (generalized)   ONSET DATE: DOS 07/08/2021   SUBJECTIVE:                                                                                                                                                                                            SUBJECTIVE STATEMENT: Pt is 16 weeks and 2 days s/p R L1-2, L2-3  diskectomy.  Pt states she is doing well and is improving.  Pt denies any adverse effects after prior Rx.  Pt reports compliance with HEP.  She has been consistent with walking program and decreased her walking to 2 miles this week.  Pt received a message from MD that she is able to lift up to 37 lbs.  Pt reports that's a job requirement for her to be able to  lift 37#.    PERTINENT HISTORY:  R L1-2 and L2-3 diskectomy on 07/08/2021 depression   PAIN:  Are you having pain? Yes NPRS:  Current:  0/10, Worst:  6-7/10, Best: 1/10 Location:  R sided lower lumbar.  Pt denies leg pain and N/T.      PRECAUTIONS: Back; Pt has B/L/T precautions.  Pt able to lift up to 37 lbs per MD message.   WEIGHT BEARING RESTRICTIONS No   FALLS:  Has patient fallen in last 6 months? 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   PATIENT GOALS to get back to work     OBJECTIVE:    DIAGNOSTIC FINDINGS:  Lumbar MRI on 06/19/21 (prior to surgery): FINDINGS: Segmentation:  Standard.   Alignment:  No significant listhesis.   Vertebrae: Minor loss of height at the superior endplate of L3 eccentric to the right with underlying marrow edema. Vertebral body heights are otherwise maintained. No suspicious osseous lesion.   Conus medullaris and cauda equina: Conus extends to the L1 level. Conus and cauda equina appear normal.   Paraspinal and other soft tissues: Unremarkable.   Disc levels:   T12-L1: Shallow central protrusion.  No canal or foraminal stenosis.   L1-L2: Right central/subarticular disc protrusion. No canal stenosis. Narrowing of the right subarticular recess with potential compression of right L2 nerve root. No foraminal stenosis.   L2-L3: Right subarticular disc protrusion superimposed on mild disc bulge. No canal stenosis. Effacement of right  subarticular recess with compression of right L3 nerve root. There is also a superiorly directed extrusion component of the disc herniation that likely compresses the exiting L2 nerve root. No foraminal stenosis.   L3-L4:  Minimal disc bulge.  No canal or foraminal stenosis.   L4-L5: Minimal disc bulge with a small central extrusion extending just below disc level. Mild facet arthropathy. No canal or foraminal stenosis.   L5-S1:  Mild facet arthropathy.  No canal or foraminal stenosis.   IMPRESSION: Minor loss of height at the superior endplate of L3. Underlying marrow edema may reflect recent compression fracture or may be on a degenerative basis.   Multilevel degenerative changes as detailed above. Disc herniations at at L1-L2 and L2-L3. Compression of the right L3 nerve root at L2-L3. Probable compression of right L2 nerve root at L2-L3. Possible compression of right L2 nerve root at L1-L2.         TODAY'S TREATMENT 10/16/21 Therapeutic Exercise: Reviewed current function, pain level, HEP compliance, and response to prior Rx.  Pt performed:  Ambulated on TM with UE support 1.8 - 2.7 MPH x 6 mins Supine alt UE/LE with TrA 2x10 reps  S/L'ing Hip abduction with TrA 2x10 reps S/L Clams with RTB with Tra 2x10 reps  Supine SLR with TrA 2x10 reps  Prone hip extension x 10 reps   Qped alt UE with TrA 2x10 reps  Qped alt LE with TrA x 10 reps Wall squats with Tra x 10 reps   Therapeutic Activities: PT educated pt concerning biomechanics with proper lifting techniques.  Educated pt with proper squat to lift, to keep load close, and to not twist.  Pt performed squat to lift actively with cuing.  Pt then performed squat to lift technique lifting the wooden box from an elevated table with cuing and instruction in correct form.  PATIENT EDUCATION:  Education details:  Designer, fashion/clothing with lifting. exercise form and purpose, progressions of exercise today  Person educated:  Patient Education method: Explanation, Demonstration, Tactile cues, Verbal cues, and Handouts Education comprehension: verbalized understanding, returned demonstration, verbal cues required, tactile cues required, and needs further education     HOME EXERCISE PROGRAM: Access Code: [facility] URL: [email or web address] Date: 10/14/2021 Prepared by: Ronny Flurry    ASSESSMENT:   CLINICAL IMPRESSION: Pt is progressing well with core strength, function, and protocol.  She presents to Rx reporting no pain.  PT progressed exercises for core and LE strengthening per protocol and Pt tolerated exercises well without c/o's.  She performed exercises per protocol well with cuing and instruction in correct form. Pt has new orders from MD that she can lift up to 37 lbs.  PT educated pt in correct body mechanics with proper lifting technique including squat to lift.  Pt performed in the clinic without pain.  She did well though does require instruction and cuing for technique.  She responded well to Rx having no c/o's after Rx.  Pt should benefit from cont skilled PT services to address ongoing goals and to restore desired level of function.           OBJECTIVE IMPAIRMENTS decreased activity tolerance, decreased endurance, decreased mobility, decreased strength, improper body mechanics, and pain.    ACTIVITY LIMITATIONS carrying, lifting, bending, sitting, standing, squatting, and reach over head   PARTICIPATION LIMITATIONS: meal prep, cleaning, laundry, driving, community activity, and occupation   PERSONAL FACTORS 1 comorbidity: depression  are also affecting patient's functional outcome.    REHAB POTENTIAL: Good   CLINICAL DECISION MAKING: Stable/uncomplicated   EVALUATION COMPLEXITY: Low     GOALS:   SHORT TERM GOALS:    Pt will be independent and compliant with HEP for improved pain, strength, mobility, and function.  Baseline: Goal status: INITIAL Target date:  10/23/2021   2.  Pt will demo improved core strength as evidenced by progression of core exercises without adverse effects for improved tolerance to daily activities and mobility.  Baseline:  Goal status: INITIAL Target date: 10/30/2021   3.  Pt will demo good control with core exercises for improved neuromuscular control and core strength to perform work activities and functional lifting.  Baseline:  Goal status: INITIAL Target date:  11/13/2021     4.  Pt will cont with walking program and report less pain with walking program.  Baseline:  Goal status: INITIAL Target date:  11/13/2021       LONG TERM GOALS: Target date: 12/11/2021   Pt will demonstrate good form and body mechanics with squat to lift in order to perform functional lifting safely with reduced stress to lumbar.   Baseline:  Goal status: INITIAL   2.  Pt will demo R LE strength to = L LE strength for performance of functional mobility and to assist with performing occupational activities.  Baseline:  Goal status: INITIAL   3.  Pt will return to work without adverse effects.  Baseline:  Goal status: INITIAL   4.  Pt will be able to perform her functional lifting and carrying as allowed by MD without significant pain and difficulty.  Baseline:  Goal status: INITIAL         PLAN: PT FREQUENCY: 2x/week   PT DURATION:10 weeks   PLANNED INTERVENTIONS: Therapeutic exercises, Therapeutic activity, Neuromuscular re-education, Balance training, Gait training, Patient/Family education, Joint mobilization, Stair training, Aquatic Therapy, Dry Needling, Electrical stimulation,  Spinal mobilization, Cryotherapy, Moist heat, Taping, Ultrasound, Manual therapy, and Re-evaluation.   PLAN FOR NEXT SESSION:   Cont with core strengthening and flexibility per protocol.  Return to MD on 11/12/2021.  Cont with body mechanics training.  Pt has new orders from MD that she can lift up to 37 lbs   Selinda Michaels III PT, DPT 10/30/21 4:54  PM

## 2021-11-03 NOTE — Therapy (Signed)
OUTPATIENT PHYSICAL THERAPY TREATMENT NOTE   Patient Name: Renee Li MRN: [id number] DOB:28-Jan-1971, 51 y.o., female Today's Date: 11/04/2021     END OF SESSION:   PT End of Session - 11/04/21 1026     Visit Number 8    Number of Visits 20    Date for PT Re-Evaluation 12/11/21    Authorization Type [employer]    PT Start Time 1024    PT Stop Time 1108    PT Time Calculation (min) 44 min    Activity Tolerance Patient tolerated treatment well    Behavior During Therapy WFL for tasks assessed/performed                   Past Medical History:  Diagnosis Date   Anemia    Depression    Heart murmur    never caused any problems   Hypertension    Migraines    Past Surgical History:  Procedure Laterality Date   APPENDECTOMY     WISDOM TOOTH EXTRACTION     Patient Active Problem List   Diagnosis Date Noted   Polycythemia 06/30/2021   Neck fullness 06/30/2021   Essential hypertension 05/28/2018   Class 1 obesity due to excess calories without serious comorbidity with body mass index (BMI) of 30.0 to 30.9 in adult 05/28/2018   Cervicalgia 10/14/2016   Scalp laceration, sequela 07/22/2016   Concussion with loss of consciousness 07/15/2016   Osteoarthritis cervical spine 07/15/2016   Syncope and collapse 07/15/2016   Depression 04/09/2013     REFERRING PROVIDER: Newman Pies, MD    REFERRING DIAG: M51.26 (ICD-10-CM) - Other intervertebral disc displacement, lumbar region ; herniated nucleus pulposus, lumbar   Rationale for Evaluation and Treatment Rehabilitation   THERAPY DIAG:  Other low back pain   Pain in right leg   Muscle weakness (generalized)   ONSET DATE: DOS 07/08/2021   SUBJECTIVE:                                                                                                                                                                                            SUBJECTIVE STATEMENT: Pt is 17 weeks s/p R L1-2, L2-3 diskectomy.    Pt states she drove 1.5 hrs from [place] this AM and can feel a pinch right now.  Pt reports she had a 3/10 pain while driving this AM.  She was performing TrA contractions while in car.  Pt states she is doing well and is improving.  Pt denies any adverse effects after prior Rx.  Pt reports compliance with HEP.  Pt received a message from MD that she  is able to lift up to 37 lbs.  Pt reports that's a job requirement for her to be able to lift 37#.  Pt states she sees MD on August 1st and hopes she is released to go back to work.      PERTINENT HISTORY:  R L1-2 and L2-3 diskectomy on 07/08/2021 depression   PAIN:  Are you having pain? Yes NPRS:  Current:  2/10, Worst:  6-7/10, Best: 1/10 Location:  R sided lower lumbar.  Pt denies leg pain and N/T.      PRECAUTIONS: Back; Pt has B/L/T precautions.  Pt able to lift up to 37 lbs per MD message.   WEIGHT BEARING RESTRICTIONS No   FALLS:  Has patient fallen in last 6 months? 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   PATIENT GOALS to get back to work     OBJECTIVE:    DIAGNOSTIC FINDINGS:  Lumbar MRI on 06/19/21 (prior to surgery): FINDINGS: Segmentation:  Standard.   Alignment:  No significant listhesis.   Vertebrae: Minor loss of height at the superior endplate of L3 eccentric to the right with underlying marrow edema. Vertebral body heights are otherwise maintained. No suspicious osseous lesion.   Conus medullaris and cauda equina: Conus extends to the L1 level. Conus and cauda equina appear normal.   Paraspinal and other soft tissues: Unremarkable.   Disc levels:   T12-L1: Shallow central protrusion.  No canal or foraminal stenosis.   L1-L2: Right central/subarticular disc protrusion. No canal stenosis. Narrowing of the right subarticular recess with potential compression of  right L2 nerve root. No foraminal stenosis.   L2-L3: Right subarticular disc protrusion superimposed on mild disc bulge. No canal stenosis. Effacement of right subarticular recess with compression of right L3 nerve root. There is also a superiorly directed extrusion component of the disc herniation that likely compresses the exiting L2 nerve root. No foraminal stenosis.   L3-L4:  Minimal disc bulge.  No canal or foraminal stenosis.   L4-L5: Minimal disc bulge with a small central extrusion extending just below disc level. Mild facet arthropathy. No canal or foraminal stenosis.   L5-S1:  Mild facet arthropathy.  No canal or foraminal stenosis.   IMPRESSION: Minor loss of height at the superior endplate of L3. Underlying marrow edema may reflect recent compression fracture or may be on a degenerative basis.   Multilevel degenerative changes as detailed above. Disc herniations at at L1-L2 and L2-L3. Compression of the right L3 nerve root at L2-L3. Probable compression of right L2 nerve root at L2-L3. Possible compression of right L2 nerve root at L1-L2.         TODAY'S TREATMENT 10/16/21 Therapeutic Exercise: Reviewed current function, pain level, HEP compliance, and response to prior Rx.  Pt performed:  Ambulated on TM with UE support 1.8 - 2.7 MPH x 7 mins Supine alt UE/LE with TrA 2x10 reps Supine bridge with TrA 2x10 reps  S/L'ing Hip abduction with TrA 2x10 reps S/L Clams with RTB with Tra 2x10 reps  Supine SLR with TrA 2x10 reps  Prone hip extension x 10 reps   Qped alt UE with TrA x10 reps  Qped alt LE with TrA x 10 reps  Standing Heel raises with TrA 2x10 reps SLS with TrA without UE support 2x15-20 sec bilat Standing rows with  TrA and with scap retraction 2x10 with GTB Shoulder extension with TrA with RTB 2x10 reps    Therapeutic Activities: PT educated pt concerning biomechanics with proper lifting techniques.  Educated pt with proper squat to lift, to keep load  close, and to not twist.  Pt performed squat to lift actively with cuing.  She performed squat to lift with 10# KB to elevated table.  Pt then performed squat to lift technique lifting the wooden box from an elevated table.  She also performed properly lifting the wooden box from the elevated table and turning her whole body and placing it on a chair without twisting and then returning the wooden box back to the table. Pt received cuing and instruction in correct form.     PATIENT EDUCATION:  Education details:  Designer, fashion/clothing with lifting, HEP, exercise form, exercise rationale, and POC. Person educated: Patient Education method: Explanation, Demonstration, Tactile cues, Verbal cues Education comprehension: verbalized understanding, returned demonstration, verbal cues required, tactile cues required, and needs further education     HOME EXERCISE PROGRAM: Access Code: [facility] URL: [email or web address] Date: 10/14/2021 Prepared by: Ronny Flurry Updated HEP: - Sidelying Hip Abduction  - 1 x daily - 5-6 x weekly - 2 sets - 10 reps - Supine Active Straight Leg Raise  - 1 x daily - 5 x weekly - 2 sets - 10 reps - Standing Heel Raise with Support  - 1 x daily - 6 x weekly - 2 sets - 10 reps - Standing Shoulder Row with Anchored Resistance  - 1 x daily - 3-4 x weekly - 2 sets - 10 reps    ASSESSMENT:   CLINICAL IMPRESSION: Pt is progressing well with core strength, function, and protocol.  Pt is improving with core strength as evidenced by performance and progression of exercises per protocol.  PT educated pt in correct body mechanics with proper lifting technique and Pt performed well.  She performed squat to lift with a 10 # KB and then the wooden box without pain.  Pt is very motivated and is compliant with HEP.  She responded well to Rx having no c/o's after Rx.  Pt should benefit from cont skilled PT services to address ongoing goals and to restore desired level of  function.           OBJECTIVE IMPAIRMENTS decreased activity tolerance, decreased endurance, decreased mobility, decreased strength, improper body mechanics, and pain.    ACTIVITY LIMITATIONS carrying, lifting, bending, sitting, standing, squatting, and reach over head   PARTICIPATION LIMITATIONS: meal prep, cleaning, laundry, driving, community activity, and occupation   PERSONAL FACTORS 1 comorbidity: depression  are also affecting patient's functional outcome.    REHAB POTENTIAL: Good   CLINICAL DECISION MAKING: Stable/uncomplicated   EVALUATION COMPLEXITY: Low     GOALS:   SHORT TERM GOALS:    Pt will be independent and compliant with HEP for improved pain, strength, mobility, and function.  Baseline: Goal status: INITIAL Target date: 10/23/2021   2.  Pt will demo improved core strength as evidenced by progression of core exercises without adverse effects for improved tolerance to daily activities and mobility.  Baseline:  Goal status: INITIAL Target date: 10/30/2021   3.  Pt will demo good control with core exercises for improved neuromuscular control and core strength to perform work activities and functional lifting.  Baseline:  Goal status: INITIAL Target date:  11/13/2021     4.  Pt will cont with walking program and report less  pain with walking program.  Baseline:  Goal status: INITIAL Target date:  11/13/2021       LONG TERM GOALS: Target date: 12/11/2021   Pt will demonstrate good form and body mechanics with squat to lift in order to perform functional lifting safely with reduced stress to lumbar.   Baseline:  Goal status: INITIAL   2.  Pt will demo R LE strength to = L LE strength for performance of functional mobility and to assist with performing occupational activities.  Baseline:  Goal status: INITIAL   3.  Pt will return to work without adverse effects.  Baseline:  Goal status: INITIAL   4.  Pt will be able to perform her functional lifting and  carrying as allowed by MD without significant pain and difficulty.  Baseline:  Goal status: INITIAL         PLAN: PT FREQUENCY: 2x/week   PT DURATION:10 weeks   PLANNED INTERVENTIONS: Therapeutic exercises, Therapeutic activity, Neuromuscular re-education, Balance training, Gait training, Patient/Family education, Joint mobilization, Stair training, Aquatic Therapy, Dry Needling, Electrical stimulation, Spinal mobilization, Cryotherapy, Moist heat, Taping, Ultrasound, Manual therapy, and Re-evaluation.   PLAN FOR NEXT SESSION:   Cont with core strengthening and flexibility per protocol.  Return to MD on 11/12/2021.  Cont with body mechanics training.  Pt has new orders from MD that she can lift up to 37 lbs   Selinda Michaels III PT, DPT 11/04/21 2:14 PM

## 2021-11-04 ENCOUNTER — Ambulatory Visit (HOSPITAL_BASED_OUTPATIENT_CLINIC_OR_DEPARTMENT_OTHER): Payer: Commercial Managed Care - PPO | Admitting: Physical Therapy

## 2021-11-04 ENCOUNTER — Encounter (HOSPITAL_BASED_OUTPATIENT_CLINIC_OR_DEPARTMENT_OTHER): Payer: Self-pay | Admitting: Physical Therapy

## 2021-11-04 DIAGNOSIS — M5459 Other low back pain: Secondary | ICD-10-CM

## 2021-11-04 DIAGNOSIS — M6281 Muscle weakness (generalized): Secondary | ICD-10-CM

## 2021-11-04 DIAGNOSIS — M79604 Pain in right leg: Secondary | ICD-10-CM

## 2021-11-05 NOTE — Therapy (Signed)
OUTPATIENT PHYSICAL THERAPY TREATMENT NOTE   Patient Name: Renee Li MRN: [id number] DOB:[birth year], 51 y.o., female Today's Date: 11/07/2021     END OF SESSION:   PT End of Session - 11/06/21 1033     Visit Number 9    Number of Visits 20    Date for PT Re-Evaluation 12/11/21    Authorization Type [employer]    PT Start Time 1025    PT Stop Time 2952    PT Time Calculation (min) 40 min    Activity Tolerance Patient tolerated treatment well    Behavior During Therapy WFL for tasks assessed/performed                    Past Medical History:  Diagnosis Date   Anemia    Depression    Heart murmur    never caused any problems   Hypertension    Migraines    Past Surgical History:  Procedure Laterality Date   APPENDECTOMY     WISDOM TOOTH EXTRACTION     Patient Active Problem List   Diagnosis Date Noted   Polycythemia 06/30/2021   Neck fullness 06/30/2021   Essential hypertension 05/28/2018   Class 1 obesity due to excess calories without serious comorbidity with body mass index (BMI) of 30.0 to 30.9 in adult 05/28/2018   Cervicalgia 10/14/2016   Scalp laceration, sequela 07/22/2016   Concussion with loss of consciousness 07/15/2016   Osteoarthritis cervical spine 07/15/2016   Syncope and collapse 07/15/2016   Depression 04/09/2013     REFERRING PROVIDER: Newman Pies, MD    REFERRING DIAG: M51.26 (ICD-10-CM) - Other intervertebral disc displacement, lumbar region ; herniated nucleus pulposus, lumbar   Rationale for Evaluation and Treatment Rehabilitation   THERAPY DIAG:  Other low back pain   Pain in right leg   Muscle weakness (generalized)   ONSET DATE: DOS 07/08/2021   SUBJECTIVE:                                                                                                                                                                                            SUBJECTIVE STATEMENT: Pt is 17 weeks and 2 days s/p R L1-2, L2-3  diskectomy.   Pt reports she did some gardening this AM and can feel the R side of her lumbar.  She has 1/10 pain currently.  Pt states she is doing well and is improving.  Pt denies any adverse effects after prior Rx.  Pt reports compliance with HEP.  Pt states she has been performing some lifting.  She has been practicing lifting her suitcase.  Pt has  been mimicking the motions she would do in the airplane.    Pt received a message from MD that she is able to lift up to 37 lbs.  Pt reports that's a job requirement for her to be able to lift 37#.  Pt states she sees MD on August 1st and hopes she is released to go back to work.      PERTINENT HISTORY:  R L1-2 and L2-3 diskectomy on 07/08/2021 depression   PAIN:  Are you having pain? Yes NPRS:  Current:  1/10, Worst:  6-7/10, Best: 1/10 Location:  R sided lower lumbar.  Pt denies leg pain and N/T.      PRECAUTIONS: Back; Pt has B/L/T precautions.  Pt able to lift up to 37 lbs per MD message.   WEIGHT BEARING RESTRICTIONS No   FALLS:  Has patient fallen in last 6 months? 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   PATIENT GOALS to get back to work     OBJECTIVE:    DIAGNOSTIC FINDINGS:  Lumbar MRI on 06/19/21 (prior to surgery): FINDINGS: Segmentation:  Standard.   Alignment:  No significant listhesis.   Vertebrae: Minor loss of height at the superior endplate of L3 eccentric to the right with underlying marrow edema. Vertebral body heights are otherwise maintained. No suspicious osseous lesion.   Conus medullaris and cauda equina: Conus extends to the L1 level. Conus and cauda equina appear normal.   Paraspinal and other soft tissues: Unremarkable.   Disc levels:   T12-L1: Shallow central protrusion.  No canal or foraminal stenosis.   L1-L2: Right central/subarticular disc  protrusion. No canal stenosis. Narrowing of the right subarticular recess with potential compression of right L2 nerve root. No foraminal stenosis.   L2-L3: Right subarticular disc protrusion superimposed on mild disc bulge. No canal stenosis. Effacement of right subarticular recess with compression of right L3 nerve root. There is also a superiorly directed extrusion component of the disc herniation that likely compresses the exiting L2 nerve root. No foraminal stenosis.   L3-L4:  Minimal disc bulge.  No canal or foraminal stenosis.   L4-L5: Minimal disc bulge with a small central extrusion extending just below disc level. Mild facet arthropathy. No canal or foraminal stenosis.   L5-S1:  Mild facet arthropathy.  No canal or foraminal stenosis.   IMPRESSION: Minor loss of height at the superior endplate of L3. Underlying marrow edema may reflect recent compression fracture or may be on a degenerative basis.   Multilevel degenerative changes as detailed above. Disc herniations at at L1-L2 and L2-L3. Compression of the right L3 nerve root at L2-L3. Probable compression of right L2 nerve root at L2-L3. Possible compression of right L2 nerve root at L1-L2.         TODAY'S TREATMENT 10/16/21 Therapeutic Exercise: Reviewed current function, pain level, HEP compliance, and response to prior Rx.  Pt performed:  Ambulated on TM with UE support 1.7 - 3.0 MPH x 7 mins  Wall squats with TrA 2x10 reps S/L Clams with RTB with Tra 2x10 reps  Prone hip extension 2 x 10 reps   Qped alt UE with TrA x10 reps  Qped alt LE with TrA x 10 reps  Qped birddog 2x10 reps  SLS with TrA without UE support 3x20 sec bilat Standing rows with TrA  and with scap retraction x10 each with GTB and BTB Shoulder extension with TrA with GTB 2x10 reps    Therapeutic Activities: PT educated pt concerning biomechanics with proper lifting techniques.  Educated pt with proper squat to lift, to keep load close, and  to not twist.  Pt performed squat to lift actively with cuing.  She performed squat to lift with 10# KB and 15# KB  from a 14 inch box and 12 inch boxe.  She also performed properly lifting of 15# KB from the 12 inch step and turning her whole body and placing it on the table without twisting and then returning the KB back to the table. Pt received cuing and instruction in correct form.     PATIENT EDUCATION:  Education details:  Designer, fashion/clothing with lifting, HEP, exercise form, exercise rationale, and POC. Person educated: Patient Education method: Explanation, Demonstration, Tactile cues, Verbal cues Education comprehension: verbalized understanding, returned demonstration, verbal cues required, tactile cues required, and needs further education     HOME EXERCISE PROGRAM: Access Code: [facility] URL: [email or web address] Date: 10/14/2021 Prepared by: Ronny Flurry Updated HEP: - Sidelying Hip Abduction  - 1 x daily - 5-6 x weekly - 2 sets - 10 reps - Supine Active Straight Leg Raise  - 1 x daily - 5 x weekly - 2 sets - 10 reps - Standing Heel Raise with Support  - 1 x daily - 6 x weekly - 2 sets - 10 reps - Standing Shoulder Row with Anchored Resistance  - 1 x daily - 3-4 x weekly - 2 sets - 10 reps    ASSESSMENT:   CLINICAL IMPRESSION: Pt is progressing well with core strength, function, and protocol.  Pt is improving with core strength as evidenced by performance and progression of exercises per protocol.  PT added birddogs today and she was challenged with birddogs.  Pt was fatigued with birddogs and required instruction in correct form.  PT demonstrated good form overall with lifting technique and required minimal cuing.  PT increased resistance with lifting and also had pt performed squat to lift at a lower depth.  Pt performed well without pain.  Pt is very motivated and is compliant with HEP.  She responded well to Rx having no increased pain after Rx.  Pt  should benefit from cont skilled PT services to address ongoing goals and to restore desired level of function.           OBJECTIVE IMPAIRMENTS decreased activity tolerance, decreased endurance, decreased mobility, decreased strength, improper body mechanics, and pain.    ACTIVITY LIMITATIONS carrying, lifting, bending, sitting, standing, squatting, and reach over head   PARTICIPATION LIMITATIONS: meal prep, cleaning, laundry, driving, community activity, and occupation   PERSONAL FACTORS 1 comorbidity: depression  are also affecting patient's functional outcome.    REHAB POTENTIAL: Good   CLINICAL DECISION MAKING: Stable/uncomplicated   EVALUATION COMPLEXITY: Low     GOALS:   SHORT TERM GOALS:    Pt will be independent and compliant with HEP for improved pain, strength, mobility, and function.  Baseline: Goal status: INITIAL Target date: 10/23/2021   2.  Pt will demo improved core strength as evidenced by progression of core exercises without adverse effects for improved tolerance to daily activities and mobility.  Baseline:  Goal status: INITIAL Target date: 10/30/2021   3.  Pt will demo good control with core exercises for improved neuromuscular control and core strength to perform work activities and functional lifting.  Baseline:  Goal status: INITIAL Target date:  11/13/2021     4.  Pt will cont with walking program and report less pain with walking program.  Baseline:  Goal status: INITIAL Target date:  11/13/2021       LONG TERM GOALS: Target date: 12/11/2021   Pt will demonstrate good form and body mechanics with squat to lift in order to perform functional lifting safely with reduced stress to lumbar.   Baseline:  Goal status: INITIAL   2.  Pt will demo R LE strength to = L LE strength for performance of functional mobility and to assist with performing occupational activities.  Baseline:  Goal status: INITIAL   3.  Pt will return to work without adverse  effects.  Baseline:  Goal status: INITIAL   4.  Pt will be able to perform her functional lifting and carrying as allowed by MD without significant pain and difficulty.  Baseline:  Goal status: INITIAL         PLAN: PT FREQUENCY: 2x/week   PT DURATION:10 weeks   PLANNED INTERVENTIONS: Therapeutic exercises, Therapeutic activity, Neuromuscular re-education, Balance training, Gait training, Patient/Family education, Joint mobilization, Stair training, Aquatic Therapy, Dry Needling, Electrical stimulation, Spinal mobilization, Cryotherapy, Moist heat, Taping, Ultrasound, Manual therapy, and Re-evaluation.   PLAN FOR NEXT SESSION:   Cont with core strengthening and flexibility per protocol.  Return to MD on 11/12/2021.  Cont with body mechanics training.  Pt has orders from MD that she can lift up to 37 lbs   Selinda Michaels III PT, DPT 11/07/21 9:05 AM

## 2021-11-06 ENCOUNTER — Ambulatory Visit (HOSPITAL_BASED_OUTPATIENT_CLINIC_OR_DEPARTMENT_OTHER): Payer: Commercial Managed Care - PPO | Admitting: Physical Therapy

## 2021-11-06 ENCOUNTER — Encounter (HOSPITAL_BASED_OUTPATIENT_CLINIC_OR_DEPARTMENT_OTHER): Payer: Self-pay | Admitting: Physical Therapy

## 2021-11-06 DIAGNOSIS — M5459 Other low back pain: Secondary | ICD-10-CM | POA: Diagnosis not present

## 2021-11-06 DIAGNOSIS — M79604 Pain in right leg: Secondary | ICD-10-CM

## 2021-11-06 DIAGNOSIS — M6281 Muscle weakness (generalized): Secondary | ICD-10-CM

## 2021-11-08 ENCOUNTER — Other Ambulatory Visit: Payer: Self-pay | Admitting: Nurse Practitioner

## 2021-11-08 DIAGNOSIS — N289 Disorder of kidney and ureter, unspecified: Secondary | ICD-10-CM

## 2021-11-08 DIAGNOSIS — I10 Essential (primary) hypertension: Secondary | ICD-10-CM

## 2021-11-08 MED ORDER — HYDROCHLOROTHIAZIDE 12.5 MG PO TABS: 12.5000 mg | ORAL_TABLET | Freq: Every day | ORAL | 1 refills | Status: DC

## 2021-11-11 ENCOUNTER — Encounter (HOSPITAL_BASED_OUTPATIENT_CLINIC_OR_DEPARTMENT_OTHER): Payer: Self-pay

## 2021-11-11 ENCOUNTER — Ambulatory Visit (HOSPITAL_BASED_OUTPATIENT_CLINIC_OR_DEPARTMENT_OTHER): Payer: Commercial Managed Care - PPO | Admitting: Physical Therapy

## 2021-11-13 ENCOUNTER — Encounter (HOSPITAL_BASED_OUTPATIENT_CLINIC_OR_DEPARTMENT_OTHER): Payer: Self-pay | Admitting: Physical Therapy

## 2021-11-13 ENCOUNTER — Ambulatory Visit (HOSPITAL_BASED_OUTPATIENT_CLINIC_OR_DEPARTMENT_OTHER): Payer: Commercial Managed Care - PPO | Attending: Neurosurgery | Admitting: Physical Therapy

## 2021-11-13 DIAGNOSIS — M79604 Pain in right leg: Secondary | ICD-10-CM | POA: Insufficient documentation

## 2021-11-13 DIAGNOSIS — M6281 Muscle weakness (generalized): Secondary | ICD-10-CM | POA: Insufficient documentation

## 2021-11-13 DIAGNOSIS — M5459 Other low back pain: Secondary | ICD-10-CM | POA: Insufficient documentation

## 2021-11-13 NOTE — Therapy (Signed)
OUTPATIENT PHYSICAL THERAPY TREATMENT NOTE   Patient Name: Renee Li MRN: [id number] DOB:30-Sep-1970, 51 y.o., female Today's Date: 11/13/2021     END OF SESSION:   PT End of Session - 11/13/21 0802     Visit Number 10    Number of Visits 20    Date for PT Re-Evaluation 12/11/21    Authorization Type [employer]    PT Start Time 0800    PT Stop Time 0843    PT Time Calculation (min) 43 min    Activity Tolerance Patient tolerated treatment well    Behavior During Therapy WFL for tasks assessed/performed                     Past Medical History:  Diagnosis Date   Anemia    Depression    Heart murmur    never caused any problems   Hypertension    Migraines    Past Surgical History:  Procedure Laterality Date   APPENDECTOMY     WISDOM TOOTH EXTRACTION     Patient Active Problem List   Diagnosis Date Noted   Polycythemia 06/30/2021   Neck fullness 06/30/2021   Essential hypertension 05/28/2018   Class 1 obesity due to excess calories without serious comorbidity with body mass index (BMI) of 30.0 to 30.9 in adult 05/28/2018   Cervicalgia 10/14/2016   Scalp laceration, sequela 07/22/2016   Concussion with loss of consciousness 07/15/2016   Osteoarthritis cervical spine 07/15/2016   Syncope and collapse 07/15/2016   Depression 04/09/2013     REFERRING PROVIDER: Newman Pies, MD    REFERRING DIAG: M51.26 (ICD-10-CM) - Other intervertebral disc displacement, lumbar region ; herniated nucleus pulposus, lumbar   Rationale for Evaluation and Treatment Rehabilitation   THERAPY DIAG:  Other low back pain   Pain in right leg   Muscle weakness (generalized)   ONSET DATE: DOS 07/08/2021   SUBJECTIVE:                                                                                                                                                                                            SUBJECTIVE STATEMENT: Pt is 17 weeks and 2 days s/p R L1-2, L2-3  diskectomy.    Patient had t spend the day with her sister at the ED. Her back is a little sore. She did a lot of helping her sister and sitting in the emergency room.   Pt received a message from MD that she is able to lift up to 37 lbs.  Pt reports that's a job requirement for her to be able to lift 37#.  Pt states she sees MD on August 1st and hopes she is released to go back to work.      PERTINENT HISTORY:  R L1-2 and L2-3 diskectomy on 07/08/2021 depression   PAIN:  Are you having pain? Yes NPRS:  Current:  1/10, Worst:  6-7/10, Best: 1/10 Location:  R sided lower lumbar.  Pt denies leg pain and N/T.      PRECAUTIONS: Back; Pt has B/L/T precautions.  Pt able to lift up to 37 lbs per MD message.   WEIGHT BEARING RESTRICTIONS No   FALLS:  Has patient fallen in last 6 months? 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   PATIENT GOALS to get back to work     OBJECTIVE:    DIAGNOSTIC FINDINGS:  Lumbar MRI on 06/19/21 (prior to surgery): FINDINGS: Segmentation:  Standard.   Alignment:  No significant listhesis.   Vertebrae: Minor loss of height at the superior endplate of L3 eccentric to the right with underlying marrow edema. Vertebral body heights are otherwise maintained. No suspicious osseous lesion.   Conus medullaris and cauda equina: Conus extends to the L1 level. Conus and cauda equina appear normal.   Paraspinal and other soft tissues: Unremarkable.   Disc levels:   T12-L1: Shallow central protrusion.  No canal or foraminal stenosis.   L1-L2: Right central/subarticular disc protrusion. No canal stenosis. Narrowing of the right subarticular recess with potential compression of right L2 nerve root. No foraminal stenosis.   L2-L3: Right subarticular disc protrusion superimposed on mild disc bulge. No canal stenosis.  Effacement of right subarticular recess with compression of right L3 nerve root. There is also a superiorly directed extrusion component of the disc herniation that likely compresses the exiting L2 nerve root. No foraminal stenosis.   L3-L4:  Minimal disc bulge.  No canal or foraminal stenosis.   L4-L5: Minimal disc bulge with a small central extrusion extending just below disc level. Mild facet arthropathy. No canal or foraminal stenosis.   L5-S1:  Mild facet arthropathy.  No canal or foraminal stenosis.   IMPRESSION: Minor loss of height at the superior endplate of L3. Underlying marrow edema may reflect recent compression fracture or may be on a degenerative basis.   Multilevel degenerative changes as detailed above. Disc herniations at at L1-L2 and L2-L3. Compression of the right L3 nerve root at L2-L3. Probable compression of right L2 nerve root at L2-L3. Possible compression of right L2 nerve root at L1-L2.         TODAY'S TREATMENT  8/2  LTR x20 for warm up  Ambulated on TM with UE support 1.7 - 3.0 MPH x 7 mins Prone hip extension 2 x 10 reps  Bridge 2x10  Standing rows with TrA and with scap retraction x10 each with GTB and BTB Shoulder extension with TrA with GTB 2x10 reps Pallof Press Grene 2x10  Band Punches 2x10    S/L Clams with RTB with Tra 2x10 reps  Reviewed benefits of TA breathing     10/16/21 Therapeutic Exercise: Reviewed current function, pain level, HEP compliance, and response to prior Rx.  Pt performed:  Ambulated on TM with UE support 1.7 - 3.0 MPH x 7 mins  Wall squats with TrA 2x10 reps S/L Clams with RTB with Tra 2x10 reps  Prone hip extension 2 x  10 reps   Qped alt UE with TrA x10 reps  Qped alt LE with TrA x 10 reps  Qped birddog 2x10 reps  SLS with TrA without UE support 3x20 sec bilat Standing rows with TrA and with scap retraction x10 each with GTB and BTB Shoulder extension with TrA with GTB 2x10 reps    Therapeutic  Activities: PT educated pt concerning biomechanics with proper lifting techniques.  Educated pt with proper squat to lift, to keep load close, and to not twist.  Pt performed squat to lift actively with cuing.  She performed squat to lift with 10# KB and 15# KB  from a 14 inch box and 12 inch boxe.  She also performed properly lifting of 15# KB from the 12 inch step and turning her whole body and placing it on the table without twisting and then returning the KB back to the table. Pt received cuing and instruction in correct form.     PATIENT EDUCATION:  Education details:  Designer, fashion/clothing with lifting, HEP, exercise form, exercise rationale, and POC. Person educated: Patient Education method: Explanation, Demonstration, Tactile cues, Verbal cues Education comprehension: verbalized understanding, returned demonstration, verbal cues required, tactile cues required, and needs further education     HOME EXERCISE PROGRAM: Access Code: [facility] URL: [email or web address] Date: 10/14/2021 Prepared by: Ronny Flurry Updated HEP: - Sidelying Hip Abduction  - 1 x daily - 5-6 x weekly - 2 sets - 10 reps - Supine Active Straight Leg Raise  - 1 x daily - 5 x weekly - 2 sets - 10 reps - Standing Heel Raise with Support  - 1 x daily - 6 x weekly - 2 sets - 10 reps - Standing Shoulder Row with Anchored Resistance  - 1 x daily - 3-4 x weekly - 2 sets - 10 reps    ASSESSMENT:   CLINICAL IMPRESSION: Patient came in with minor soreness in the back but did very well with her exercises. We educated her on the importance of activating her TA with work tasks . We worked on pushing activity to prepare her for her return to work next week. She had no significant pain. We added pallof press and regular press ot her home program. She plans to take her band with her to her hotel and keep working on her exercises. Therapy performed FOTO on her for her tenth visit. She has met her FOTO goal in 10  visits ( 13 expected). She will return to work next week. We will re-assess at that time.       OBJECTIVE IMPAIRMENTS decreased activity tolerance, decreased endurance, decreased mobility, decreased strength, improper body mechanics, and pain.    ACTIVITY LIMITATIONS carrying, lifting, bending, sitting, standing, squatting, and reach over head   PARTICIPATION LIMITATIONS: meal prep, cleaning, laundry, driving, community activity, and occupation   PERSONAL FACTORS 1 comorbidity: depression  are also affecting patient's functional outcome.    REHAB POTENTIAL: Good   CLINICAL DECISION MAKING: Stable/uncomplicated   EVALUATION COMPLEXITY: Low     GOALS:   SHORT TERM GOALS:    Pt will be independent and compliant with HEP for improved pain, strength, mobility, and function.  Baseline: Goal status: INITIAL Target date: 10/23/2021   2.  Pt will demo improved core strength as evidenced by progression of core exercises without adverse effects for improved tolerance to daily activities and mobility.  Baseline:  Goal status: INITIAL Target date: 10/30/2021   3.  Pt will demo good control with  core exercises for improved neuromuscular control and core strength to perform work activities and functional lifting.  Baseline:  Goal status: INITIAL Target date:  11/13/2021     4.  Pt will cont with walking program and report less pain with walking program.  Baseline:  Goal status: INITIAL Target date:  11/13/2021       LONG TERM GOALS: Target date: 12/11/2021   Pt will demonstrate good form and body mechanics with squat to lift in order to perform functional lifting safely with reduced stress to lumbar.   Baseline:  Goal status: INITIAL   2.  Pt will demo R LE strength to = L LE strength for performance of functional mobility and to assist with performing occupational activities.  Baseline:  Goal status: INITIAL   3.  Pt will return to work without adverse effects.  Baseline:  Goal  status: INITIAL   4.  Pt will be able to perform her functional lifting and carrying as allowed by MD without significant pain and difficulty.  Baseline:  Goal status: INITIAL         PLAN: PT FREQUENCY: 2x/week   PT DURATION:10 weeks   PLANNED INTERVENTIONS: Therapeutic exercises, Therapeutic activity, Neuromuscular re-education, Balance training, Gait training, Patient/Family education, Joint mobilization, Stair training, Aquatic Therapy, Dry Needling, Electrical stimulation, Spinal mobilization, Cryotherapy, Moist heat, Taping, Ultrasound, Manual therapy, and Re-evaluation.   PLAN FOR NEXT SESSION:   Cont with core strengthening and flexibility per protocol.  Return to MD on 11/12/2021.  Cont with body mechanics training.  Pt has orders from MD that she can lift up to 37 lbs   Carolyne Littles PT DPT  11/13/21 9:10 AM

## 2021-11-18 ENCOUNTER — Ambulatory Visit (HOSPITAL_BASED_OUTPATIENT_CLINIC_OR_DEPARTMENT_OTHER): Payer: Commercial Managed Care - PPO | Admitting: Physical Therapy

## 2021-11-18 ENCOUNTER — Encounter (HOSPITAL_BASED_OUTPATIENT_CLINIC_OR_DEPARTMENT_OTHER): Payer: Self-pay | Admitting: Physical Therapy

## 2021-11-18 DIAGNOSIS — M79604 Pain in right leg: Secondary | ICD-10-CM

## 2021-11-18 DIAGNOSIS — M6281 Muscle weakness (generalized): Secondary | ICD-10-CM

## 2021-11-18 DIAGNOSIS — M5459 Other low back pain: Secondary | ICD-10-CM | POA: Diagnosis not present

## 2021-11-18 NOTE — Therapy (Signed)
OUTPATIENT PHYSICAL THERAPY TREATMENT NOTE   Patient Name: Renee Li MRN: [id number] DOB:1970/10/07, 50 y.o., female Today's Date: 11/18/2021     END OF SESSION:   PT End of Session - 11/18/21 0810     Visit Number 11    Number of Visits 20    Date for PT Re-Evaluation 12/11/21    Authorization Type [employer]    PT Start Time 0801    PT Stop Time 0840    PT Time Calculation (min) 39 min    Activity Tolerance Patient tolerated treatment well    Behavior During Therapy WFL for tasks assessed/performed                      Past Medical History:  Diagnosis Date   Anemia    Depression    Heart murmur    never caused any problems   Hypertension    Migraines    Past Surgical History:  Procedure Laterality Date   APPENDECTOMY     WISDOM TOOTH EXTRACTION     Patient Active Problem List   Diagnosis Date Noted   Polycythemia 06/30/2021   Neck fullness 06/30/2021   Essential hypertension 05/28/2018   Class 1 obesity due to excess calories without serious comorbidity with body mass index (BMI) of 30.0 to 30.9 in adult 05/28/2018   Cervicalgia 10/14/2016   Scalp laceration, sequela 07/22/2016   Concussion with loss of consciousness 07/15/2016   Osteoarthritis cervical spine 07/15/2016   Syncope and collapse 07/15/2016   Depression 04/09/2013     REFERRING PROVIDER: Newman Pies, MD    REFERRING DIAG: M51.26 (ICD-10-CM) - Other intervertebral disc displacement, lumbar region ; herniated nucleus pulposus, lumbar   Rationale for Evaluation and Treatment Rehabilitation   THERAPY DIAG:  Other low back pain   Pain in right leg   Muscle weakness (generalized)   ONSET DATE: DOS 07/08/2021   SUBJECTIVE:                                                                                                                                                                                            SUBJECTIVE STATEMENT:  Doing well, no changes since last  time; slowly working up to my 37# lifting precaution, gardening is hard for me. Only able to garden for 5 minutes at a time due to discomfort on the right side of my back. Did not sleep very well last night.     PERTINENT HISTORY:  R L1-2 and L2-3 diskectomy on 07/08/2021 depression   PAIN:  Are you having pain? Yes, dull ache  NPRS:  Current:  1/10,  Worst:  6-7/10, Best: 1/10 Location:  R sided lower lumbar.  Pt denies leg pain and N/T.      PRECAUTIONS: Back; Pt has B/L/T precautions.  Pt able to lift up to 37 lbs per MD message.   WEIGHT BEARING RESTRICTIONS No   FALLS:  Has patient fallen in last 6 months? 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   PATIENT GOALS to get back to work     OBJECTIVE:    DIAGNOSTIC FINDINGS:  Lumbar MRI on 06/19/21 (prior to surgery): FINDINGS: Segmentation:  Standard.   Alignment:  No significant listhesis.   Vertebrae: Minor loss of height at the superior endplate of L3 eccentric to the right with underlying marrow edema. Vertebral body heights are otherwise maintained. No suspicious osseous lesion.   Conus medullaris and cauda equina: Conus extends to the L1 level. Conus and cauda equina appear normal.   Paraspinal and other soft tissues: Unremarkable.   Disc levels:   T12-L1: Shallow central protrusion.  No canal or foraminal stenosis.   L1-L2: Right central/subarticular disc protrusion. No canal stenosis. Narrowing of the right subarticular recess with potential compression of right L2 nerve root. No foraminal stenosis.   L2-L3: Right subarticular disc protrusion superimposed on mild disc bulge. No canal stenosis. Effacement of right subarticular recess with compression of right L3 nerve root. There is also a superiorly directed extrusion component of the disc herniation that  likely compresses the exiting L2 nerve root. No foraminal stenosis.   L3-L4:  Minimal disc bulge.  No canal or foraminal stenosis.   L4-L5: Minimal disc bulge with a small central extrusion extending just below disc level. Mild facet arthropathy. No canal or foraminal stenosis.   L5-S1:  Mild facet arthropathy.  No canal or foraminal stenosis.   IMPRESSION: Minor loss of height at the superior endplate of L3. Underlying marrow edema may reflect recent compression fracture or may be on a degenerative basis.   Multilevel degenerative changes as detailed above. Disc herniations at at L1-L2 and L2-L3. Compression of the right L3 nerve root at L2-L3. Probable compression of right L2 nerve root at L2-L3. Possible compression of right L2 nerve root at L1-L2.         TODAY'S TREATMENT   11/18/21  Nustep L5 x6 minutes BLEs only  Bridges 1x20 3 second holds  Sidelying clams red TB 1x15 B Prone hip extensions 1x15 B  Wall squats 1x15 cues for wt on heels/avoiding weight on the balls of her feet  Side steps with red TB above knees down length of gym hallway   Quadruped:  - TrA sets 1x20 5 second holds  - Quadruped TrA + alternating UE lifts 1x10 B  - Quadruped TrA + alternating LE lifts 1x10 B - quadruped TrA + barely lifting opposite Ue/Le off of mat x10 B   Scapular retractions green TB 1x15 Shoulder extensions green TB 1x15  Shoulder extensions green TB + march and TrA set 1x10 B  Lifting mechanics: - 15# KB from 6 inch box height 1x15  - 15# KB from floor height 2x5   8/2  LTR x20 for warm up  Ambulated on TM with UE support 1.7 - 3.0 MPH x 7 mins Prone hip extension 2 x 10 reps  Bridge 2x10  Standing rows with TrA and with  scap retraction x10 each with GTB and BTB Shoulder extension with TrA with GTB 2x10 reps Pallof Press Grene 2x10  Band Punches 2x10    S/L Clams with RTB with Tra 2x10 reps  Reviewed benefits of TA breathing     10/16/21 Therapeutic  Exercise: Reviewed current function, pain level, HEP compliance, and response to prior Rx.  Pt performed:  Ambulated on TM with UE support 1.7 - 3.0 MPH x 7 mins  Wall squats with TrA 2x10 reps S/L Clams with RTB with Tra 2x10 reps  Prone hip extension 2 x 10 reps   Qped alt UE with TrA x10 reps  Qped alt LE with TrA x 10 reps  Qped birddog 2x10 reps  SLS with TrA without UE support 3x20 sec bilat Standing rows with TrA and with scap retraction x10 each with GTB and BTB Shoulder extension with TrA with GTB 2x10 reps    Therapeutic Activities: PT educated pt concerning biomechanics with proper lifting techniques.  Educated pt with proper squat to lift, to keep load close, and to not twist.  Pt performed squat to lift actively with cuing.  She performed squat to lift with 10# KB and 15# KB  from a 14 inch box and 12 inch boxe.  She also performed properly lifting of 15# KB from the 12 inch step and turning her whole body and placing it on the table without twisting and then returning the KB back to the table. Pt received cuing and instruction in correct form.     PATIENT EDUCATION:  Education details:  exercise form and purpose, lifting/biomechanics  Person educated: Patient Education method: Explanation, Demonstration, Tactile cues, Verbal cues Education comprehension: verbalized understanding, returned demonstration, verbal cues required, tactile cues required, and needs further education     HOME EXERCISE PROGRAM: Access Code: [facility] URL: [email or web address] Date: 10/14/2021 Prepared by: Ronny Flurry Updated HEP: - Sidelying Hip Abduction  - 1 x daily - 5-6 x weekly - 2 sets - 10 reps - Supine Active Straight Leg Raise  - 1 x daily - 5 x weekly - 2 sets - 10 reps - Standing Heel Raise with Support  - 1 x daily - 6 x weekly - 2 sets - 10 reps - Standing Shoulder Row with Anchored Resistance  - 1 x daily - 3-4 x weekly - 2 sets - 10 reps    ASSESSMENT:    CLINICAL IMPRESSION:  Renee Li arrives today doing OK, pain is staying at a low level and gardening is the hardest thing for her right now. Still working up to 37# restriction, we advanced this a little as able and tolerated with cues for good body mechanics today, otherwise focused on core strengthening and progression there of. Needed a lot of repeated cues today to perform exercises correctly. Will continue to progress as able and tolerated.      OBJECTIVE IMPAIRMENTS decreased activity tolerance, decreased endurance, decreased mobility, decreased strength, improper body mechanics, and pain.    ACTIVITY LIMITATIONS carrying, lifting, bending, sitting, standing, squatting, and reach over head   PARTICIPATION LIMITATIONS: meal prep, cleaning, laundry, driving, community activity, and occupation   PERSONAL FACTORS 1 comorbidity: depression  are also affecting patient's functional outcome.    REHAB POTENTIAL: Good   CLINICAL DECISION MAKING: Stable/uncomplicated   EVALUATION COMPLEXITY: Low     GOALS:   SHORT TERM GOALS:    Pt will be independent and compliant with HEP for improved pain, strength, mobility, and function.  Baseline: Goal  status: INITIAL Target date: 10/23/2021   2.  Pt will demo improved core strength as evidenced by progression of core exercises without adverse effects for improved tolerance to daily activities and mobility.  Baseline:  Goal status: INITIAL Target date: 10/30/2021   3.  Pt will demo good control with core exercises for improved neuromuscular control and core strength to perform work activities and functional lifting.  Baseline:  Goal status: INITIAL Target date:  11/13/2021     4.  Pt will cont with walking program and report less pain with walking program.  Baseline:  Goal status: INITIAL Target date:  11/13/2021       LONG TERM GOALS: Target date: 12/11/2021   Pt will demonstrate good form and body mechanics with squat to lift in order to  perform functional lifting safely with reduced stress to lumbar.   Baseline:  Goal status: INITIAL   2.  Pt will demo R LE strength to = L LE strength for performance of functional mobility and to assist with performing occupational activities.  Baseline:  Goal status: INITIAL   3.  Pt will return to work without adverse effects.  Baseline:  Goal status: INITIAL   4.  Pt will be able to perform her functional lifting and carrying as allowed by MD without significant pain and difficulty.  Baseline:  Goal status: INITIAL         PLAN: PT FREQUENCY: 2x/week   PT DURATION:10 weeks   PLANNED INTERVENTIONS: Therapeutic exercises, Therapeutic activity, Neuromuscular re-education, Balance training, Gait training, Patient/Family education, Joint mobilization, Stair training, Aquatic Therapy, Dry Needling, Electrical stimulation, Spinal mobilization, Cryotherapy, Moist heat, Taping, Ultrasound, Manual therapy, and Re-evaluation.   PLAN FOR NEXT SESSION:   Cont with core strengthening and flexibility per protocol.  Return to MD on 11/12/2021.  Cont with body mechanics training.  Pt has orders from MD that she can lift up to 37 lbs   Renee Li U PT DPT PN2  11/18/2021, 8:40 AM

## 2021-11-25 ENCOUNTER — Encounter (HOSPITAL_BASED_OUTPATIENT_CLINIC_OR_DEPARTMENT_OTHER): Payer: Self-pay | Admitting: Physical Therapy

## 2021-11-25 ENCOUNTER — Ambulatory Visit (HOSPITAL_BASED_OUTPATIENT_CLINIC_OR_DEPARTMENT_OTHER): Payer: Commercial Managed Care - PPO | Admitting: Physical Therapy

## 2021-11-25 DIAGNOSIS — M6281 Muscle weakness (generalized): Secondary | ICD-10-CM

## 2021-11-25 DIAGNOSIS — M79604 Pain in right leg: Secondary | ICD-10-CM

## 2021-11-25 DIAGNOSIS — M5459 Other low back pain: Secondary | ICD-10-CM

## 2021-11-25 NOTE — Therapy (Signed)
OUTPATIENT PHYSICAL THERAPY TREATMENT NOTE   Patient Name: Clarity Ciszek MRN: [id number] DOB:1970/06/09, 51 y.o., female Today's Date: 11/25/2021     END OF SESSION:   PT End of Session - 11/25/21 1024     Visit Number 12    Number of Visits 20    Date for PT Re-Evaluation 12/11/21    Authorization Type [employer]    PT Start Time 5631    PT Stop Time 1057    PT Time Calculation (min) 39 min    Activity Tolerance Patient tolerated treatment well    Behavior During Therapy WFL for tasks assessed/performed                       Past Medical History:  Diagnosis Date   Anemia    Depression    Heart murmur    never caused any problems   Hypertension    Migraines    Past Surgical History:  Procedure Laterality Date   APPENDECTOMY     WISDOM TOOTH EXTRACTION     Patient Active Problem List   Diagnosis Date Noted   Polycythemia 06/30/2021   Neck fullness 06/30/2021   Essential hypertension 05/28/2018   Class 1 obesity due to excess calories without serious comorbidity with body mass index (BMI) of 30.0 to 30.9 in adult 05/28/2018   Cervicalgia 10/14/2016   Scalp laceration, sequela 07/22/2016   Concussion with loss of consciousness 07/15/2016   Osteoarthritis cervical spine 07/15/2016   Syncope and collapse 07/15/2016   Depression 04/09/2013     REFERRING PROVIDER: Newman Pies, MD    REFERRING DIAG: M51.26 (ICD-10-CM) - Other intervertebral disc displacement, lumbar region ; herniated nucleus pulposus, lumbar   Rationale for Evaluation and Treatment Rehabilitation   THERAPY DIAG:  Other low back pain   Pain in right leg   Muscle weakness (generalized)   ONSET DATE: DOS 07/08/2021   SUBJECTIVE:                                                                                                                                                                                            SUBJECTIVE STATEMENT:  I'm back to work it was a lot, I  had to go back through retraining and I passed everything with flying colors. I'm tired I've been getting in my walking at least 5 miles a day. I've been practicing my lifting of my suitcases, sometimes I can check things in under valet and not have to carry things. I still bring my scrubbers for my back so I don't have to twist a lot when washing. Gardening is  hard because of having to get low, I have a weed whacker that's only a few pounds but I feel that on my back after a few minutes. Laundry is going well I added a shelf so that I don't have to bend as low for front loading machines. I bought me some shapers for work to help me keep my core engaged.     PERTINENT HISTORY:  R L1-2 and L2-3 diskectomy on 07/08/2021 depression   PAIN:  Are you having pain? Yes, dull ache  NPRS:  Current:  1/10, Worst:  6-7/10, Best: 1/10 Location:  R sided lower lumbar.  Pt denies leg pain and N/T.      PRECAUTIONS: Back; Pt has B/L/T precautions.  Pt able to lift up to 37 lbs per MD message.   WEIGHT BEARING RESTRICTIONS No   FALLS:  Has patient fallen in last 6 months? 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   PATIENT GOALS to get back to work     OBJECTIVE:    DIAGNOSTIC FINDINGS:  Lumbar MRI on 06/19/21 (prior to surgery): FINDINGS: Segmentation:  Standard.   Alignment:  No significant listhesis.   Vertebrae: Minor loss of height at the superior endplate of L3 eccentric to the right with underlying marrow edema. Vertebral body heights are otherwise maintained. No suspicious osseous lesion.   Conus medullaris and cauda equina: Conus extends to the L1 level. Conus and cauda equina appear normal.   Paraspinal and other soft tissues: Unremarkable.   Disc levels:   T12-L1: Shallow central protrusion.  No canal or foraminal stenosis.   L1-L2:  Right central/subarticular disc protrusion. No canal stenosis. Narrowing of the right subarticular recess with potential compression of right L2 nerve root. No foraminal stenosis.   L2-L3: Right subarticular disc protrusion superimposed on mild disc bulge. No canal stenosis. Effacement of right subarticular recess with compression of right L3 nerve root. There is also a superiorly directed extrusion component of the disc herniation that likely compresses the exiting L2 nerve root. No foraminal stenosis.   L3-L4:  Minimal disc bulge.  No canal or foraminal stenosis.   L4-L5: Minimal disc bulge with a small central extrusion extending just below disc level. Mild facet arthropathy. No canal or foraminal stenosis.   L5-S1:  Mild facet arthropathy.  No canal or foraminal stenosis.   IMPRESSION: Minor loss of height at the superior endplate of L3. Underlying marrow edema may reflect recent compression fracture or may be on a degenerative basis.   Multilevel degenerative changes as detailed above. Disc herniations at at L1-L2 and L2-L3. Compression of the right L3 nerve root at L2-L3. Probable compression of right L2 nerve root at L2-L3. Possible compression of right L2 nerve root at L1-L2.         TODAY'S TREATMENT   8/14  Nustep L6x6 minutes BLEs only   Standing marches with 6# and TrA set 2x10 UE press holds 6# core tight 2x10 2 second holds  Forward lunge onto 6 inch box OH Press 6# 2x10 B Hip hikes 2x10 B with TrA  Shuttle runs with 15# KB progressing to 20# KB- farmers carry + lift from floor height x3 at each end 3 rounds   Staggered bridges 1x15 B 2 second holds Sidelying clams green TB 1x10 B  Education to defer to  MD/surgeon about return to jogging/impact activities post-op  11/18/21  Nustep L5 x6 minutes BLEs only  Bridges 1x20 3 second holds  Sidelying clams red TB 1x15 B Prone hip extensions 1x15 B  Wall squats 1x15 cues for wt on heels/avoiding weight on  the balls of her feet  Side steps with red TB above knees down length of gym hallway   Quadruped:  - TrA sets 1x20 5 second holds  - Quadruped TrA + alternating UE lifts 1x10 B  - Quadruped TrA + alternating LE lifts 1x10 B - quadruped TrA + barely lifting opposite Ue/Le off of mat x10 B   Scapular retractions green TB 1x15 Shoulder extensions green TB 1x15  Shoulder extensions green TB + march and TrA set 1x10 B  Lifting mechanics: - 15# KB from 6 inch box height 1x15  - 15# KB from floor height 2x5   8/2  LTR x20 for warm up  Ambulated on TM with UE support 1.7 - 3.0 MPH x 7 mins Prone hip extension 2 x 10 reps  Bridge 2x10  Standing rows with TrA and with scap retraction x10 each with GTB and BTB Shoulder extension with TrA with GTB 2x10 reps Pallof Press Grene 2x10  Band Punches 2x10    S/L Clams with RTB with Tra 2x10 reps  Reviewed benefits of TA breathing     10/16/21 Therapeutic Exercise: Reviewed current function, pain level, HEP compliance, and response to prior Rx.  Pt performed:  Ambulated on TM with UE support 1.7 - 3.0 MPH x 7 mins  Wall squats with TrA 2x10 reps S/L Clams with RTB with Tra 2x10 reps  Prone hip extension 2 x 10 reps   Qped alt UE with TrA x10 reps  Qped alt LE with TrA x 10 reps  Qped birddog 2x10 reps  SLS with TrA without UE support 3x20 sec bilat Standing rows with TrA and with scap retraction x10 each with GTB and BTB Shoulder extension with TrA with GTB 2x10 reps    Therapeutic Activities: PT educated pt concerning biomechanics with proper lifting techniques.  Educated pt with proper squat to lift, to keep load close, and to not twist.  Pt performed squat to lift actively with cuing.  She performed squat to lift with 10# KB and 15# KB  from a 14 inch box and 12 inch boxe.  She also performed properly lifting of 15# KB from the 12 inch step and turning her whole body and placing it on the table without twisting and then returning  the KB back to the table. Pt received cuing and instruction in correct form.     PATIENT EDUCATION:  Education details:  exercise form and purpose, lifting/biomechanics  Person educated: Patient Education method: Explanation, Demonstration, Tactile cues, Verbal cues Education comprehension: verbalized understanding, returned demonstration, verbal cues required, tactile cues required, and needs further education     HOME EXERCISE PROGRAM: Access Code: [facility] URL: [email or web address] Date: 10/14/2021 Prepared by: Ronny Flurry Updated HEP: - Sidelying Hip Abduction  - 1 x daily - 5-6 x weekly - 2 sets - 10 reps - Supine Active Straight Leg Raise  - 1 x daily - 5 x weekly - 2 sets - 10 reps - Standing Heel Raise with Support  - 1 x daily - 6 x weekly - 2 sets - 10 reps - Standing Shoulder Row with Anchored Resistance  - 1 x daily - 3-4 x weekly - 2 sets - 10 reps  ASSESSMENT:   CLINICAL IMPRESSION:  Vicki arrives doing well, back to work now and sounds like she is doing well with functional tasks at home- has good ergonomic awareness and has arranged washer and drier /equipment to an appropriate level. Continued working on functional core and hip strengthening as well as functional biomechanics today as well. Will continue to progress as able and tolerated.      OBJECTIVE IMPAIRMENTS decreased activity tolerance, decreased endurance, decreased mobility, decreased strength, improper body mechanics, and pain.    ACTIVITY LIMITATIONS carrying, lifting, bending, sitting, standing, squatting, and reach over head   PARTICIPATION LIMITATIONS: meal prep, cleaning, laundry, driving, community activity, and occupation   PERSONAL FACTORS 1 comorbidity: depression  are also affecting patient's functional outcome.    REHAB POTENTIAL: Good   CLINICAL DECISION MAKING: Stable/uncomplicated   EVALUATION COMPLEXITY: Low     GOALS:   SHORT TERM GOALS:    Pt will be  independent and compliant with HEP for improved pain, strength, mobility, and function.  Baseline: Goal status: INITIAL Target date: 10/23/2021   2.  Pt will demo improved core strength as evidenced by progression of core exercises without adverse effects for improved tolerance to daily activities and mobility.  Baseline:  Goal status: INITIAL Target date: 10/30/2021   3.  Pt will demo good control with core exercises for improved neuromuscular control and core strength to perform work activities and functional lifting.  Baseline:  Goal status: INITIAL Target date:  11/13/2021     4.  Pt will cont with walking program and report less pain with walking program.  Baseline:  Goal status: INITIAL Target date:  11/13/2021       LONG TERM GOALS: Target date: 12/11/2021   Pt will demonstrate good form and body mechanics with squat to lift in order to perform functional lifting safely with reduced stress to lumbar.   Baseline:  Goal status: INITIAL   2.  Pt will demo R LE strength to = L LE strength for performance of functional mobility and to assist with performing occupational activities.  Baseline:  Goal status: INITIAL   3.  Pt will return to work without adverse effects.  Baseline:  Goal status: INITIAL   4.  Pt will be able to perform her functional lifting and carrying as allowed by MD without significant pain and difficulty.  Baseline:  Goal status: INITIAL         PLAN: PT FREQUENCY: 2x/week   PT DURATION:10 weeks   PLANNED INTERVENTIONS: Therapeutic exercises, Therapeutic activity, Neuromuscular re-education, Balance training, Gait training, Patient/Family education, Joint mobilization, Stair training, Aquatic Therapy, Dry Needling, Electrical stimulation, Spinal mobilization, Cryotherapy, Moist heat, Taping, Ultrasound, Manual therapy, and Re-evaluation.   PLAN FOR NEXT SESSION:   Cont with core strengthening and flexibility per protocol.  Return to MD on 11/12/2021.   Cont with body mechanics training.  Pt has orders from MD that she can lift up to 37 lbs. Consider HEP update.   Ann Lions PT DPT PN2  11/25/2021, 10:57 AM

## 2021-12-01 NOTE — Therapy (Signed)
OUTPATIENT PHYSICAL THERAPY TREATMENT NOTE / PROGRESS NOTE   Patient Name: Renee Li MRN: [id number] DOB:24-May-1970, 51 y.o., female Today's Date: 12/03/2021     END OF SESSION:   PT End of Session - 12/02/21 1355     Visit Number 13    Number of Visits 17    Date for PT Re-Evaluation 12/30/21    Authorization Type [employer]    PT Start Time 1350    PT Stop Time 1435    PT Time Calculation (min) 45 min    Activity Tolerance Patient tolerated treatment well    Behavior During Therapy WFL for tasks assessed/performed                        Past Medical History:  Diagnosis Date   Anemia    Depression    Heart murmur    never caused any problems   Hypertension    Migraines    Past Surgical History:  Procedure Laterality Date   APPENDECTOMY     WISDOM TOOTH EXTRACTION     Patient Active Problem List   Diagnosis Date Noted   Polycythemia 06/30/2021   Neck fullness 06/30/2021   Essential hypertension 05/28/2018   Class 1 obesity due to excess calories without serious comorbidity with body mass index (BMI) of 30.0 to 30.9 in adult 05/28/2018   Cervicalgia 10/14/2016   Scalp laceration, sequela 07/22/2016   Concussion with loss of consciousness 07/15/2016   Osteoarthritis cervical spine 07/15/2016   Syncope and collapse 07/15/2016   Depression 04/09/2013     REFERRING PROVIDER: Newman Pies, MD    REFERRING DIAG: M51.26 (ICD-10-CM) - Other intervertebral disc displacement, lumbar region ; herniated nucleus pulposus, lumbar   Rationale for Evaluation and Treatment Rehabilitation   THERAPY DIAG:  Other low back pain   Pain in right leg   Muscle weakness (generalized)   ONSET DATE: DOS 07/08/2021   SUBJECTIVE:                                                                                                                                                                                            SUBJECTIVE STATEMENT:  -Pt is >4.5  months s/p R L1-2 and L2-3 diskectomy. -Pt has returned to work.  She does have pain with work activities though is "tolerable".  Her worst pain is after work.  She ices her back and uses tylenol when she is at home after work.  Pt has pain in R anterior hip with performing a sit to stand transfer after a workday.    -Pt states she had a  massage earlier today and feels good since having massage.  Pt reports compliance with  HEP.   -FUNCTIONAL IMPROVEMENTS:  Ambulation distance averages 5-7 miles t/o entire day based on steps, stability, lifting.  Doing occupational activities well -FUNCTIONAL DEFICITS:  opening/closing galley service door at work, lifting including pots/pans, gardening    PERTINENT HISTORY:  R L1-2 and L2-3 diskectomy on 07/08/2021 depression   PAIN:  Are you having pain? Yes, dull ache  NPRS:  Current:  0/10, Worst:  4/10, Best: 0/10 Location:  R sided lower lumbar.  Pt denies leg pain and N/T.      PRECAUTIONS: Back; Pt has B/L/T precautions.  Pt able to lift up to 37 lbs per MD message.   WEIGHT BEARING RESTRICTIONS No   FALLS:  Has patient fallen in last 6 months? 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   PATIENT GOALS to get back to work     OBJECTIVE:    DIAGNOSTIC FINDINGS:  Lumbar MRI on 06/19/21 (prior to surgery): FINDINGS: Segmentation:  Standard.   Alignment:  No significant listhesis.   Vertebrae: Minor loss of height at the superior endplate of L3 eccentric to the right with underlying marrow edema. Vertebral body heights are otherwise maintained. No suspicious osseous lesion.   Conus medullaris and cauda equina: Conus extends to the L1 level. Conus and cauda equina appear normal.   Paraspinal and other soft tissues: Unremarkable.   Disc levels:   T12-L1: Shallow central protrusion.  No canal or  foraminal stenosis.   L1-L2: Right central/subarticular disc protrusion. No canal stenosis. Narrowing of the right subarticular recess with potential compression of right L2 nerve root. No foraminal stenosis.   L2-L3: Right subarticular disc protrusion superimposed on mild disc bulge. No canal stenosis. Effacement of right subarticular recess with compression of right L3 nerve root. There is also a superiorly directed extrusion component of the disc herniation that likely compresses the exiting L2 nerve root. No foraminal stenosis.   L3-L4:  Minimal disc bulge.  No canal or foraminal stenosis.   L4-L5: Minimal disc bulge with a small central extrusion extending just below disc level. Mild facet arthropathy. No canal or foraminal stenosis.   L5-S1:  Mild facet arthropathy.  No canal or foraminal stenosis.   IMPRESSION: Minor loss of height at the superior endplate of L3. Underlying marrow edema may reflect recent compression fracture or may be on a degenerative basis.   Multilevel degenerative changes as detailed above. Disc herniations at at L1-L2 and L2-L3. Compression of the right L3 nerve root at L2-L3. Probable compression of right L2 nerve root at L2-L3. Possible compression of right L2 nerve root at L1-L2.         TODAY'S TREATMENT   PHYSICAL PERFORMANCE TESTING: -Reviewed pain levels, reported functional progress and deficits, and HEP compliance. -Assessed gait, strength, and body mechanics/lifting form.  -Pt completed FOTO.  PATIENT SURVEYS:  FOTO 60 with a goal of 52 at visit #13.      LOWER EXTREMITY MMT:     MMT Right 12/02/21 Left eval  Hip flexion 4+/5 5/5  Hip extension      Hip abduction Tested in sitting, Improved strength, 17.8 Tested in sitting, WFL, 22.8  Hip adduction      Hip internal rotation      Hip external  rotation      Knee flexion 4+/5 5/5  Knee extension 4+/5 5/5  Ankle dorsiflexion 5/5 5/5  Ankle plantarflexion WFL tested in  sitting WFL tested in sitting  Ankle inversion      Ankle eversion       (Blank rows = not tested)     FUNCTIONAL TESTS:  Pt able to perform squat to lift 15# KB from a 6 inch step without any pain.  Pt demonstrates good form with squat to lift.   GAIT: Assistive device utilized: None Level of assistance: Complete Independence Comments: Pt ambulates with a normalized heel to toe gait without limping  THERAPEUTIC EXERCISE: Pt performed:  Qped alt UE with TrA x10 reps  Qped alt LE with TrA x 10 reps  Qped birddog 2x10 reps  Lateral band walks with RTB above knees for 2 laps at rail without UE support.     Pallof Press with GTB x 10 reps each    PATIENT EDUCATION:  Education details:  objective findings, POC, exercise form and purpose, lifting/biomechanics  Person educated: Patient Education method: Explanation, Demonstration, Tactile cues, Verbal cues Education comprehension: verbalized understanding, returned demonstration, verbal cues required, tactile cues required, and needs further education     HOME EXERCISE PROGRAM: Access Code: [facility] URL: [email or web address] Date: 10/14/2021 Prepared by: Ronny Flurry Updated HEP: - Sidelying Hip Abduction  - 1 x daily - 5-6 x weekly - 2 sets - 10 reps - Supine Active Straight Leg Raise  - 1 x daily - 5 x weekly - 2 sets - 10 reps - Standing Heel Raise with Support  - 1 x daily - 6 x weekly - 2 sets - 10 reps - Standing Shoulder Row with Anchored Resistance  - 1 x daily - 3-4 x weekly - 2 sets - 10 reps    ASSESSMENT:   CLINICAL IMPRESSION: Pt is progressing well in all areas.  She has increased her ambulation distance.  Pt is limited with lifting objects though has progressed with functional lifting activities and demonstrates good form with squat to lift.  Pt demonstrates improved R LE strength though continues to have some weakness t/o R hip and knee.  Pt has returned to work.  She does have pain with work  activities though states it's "tolerable".  Pt demonstrates improved self perceived disability as evidenced by improved FOTO score and has met her FOTO goal.   Pt has met all of her STG's and LTG #1 and is progressing toward her other LTG's.  Pt should benefit from cont skilled PT services to address ongoing goals and to restore PLOF.    OBJECTIVE IMPAIRMENTS decreased activity tolerance, decreased endurance, decreased mobility, decreased strength, improper body mechanics, and pain.    ACTIVITY LIMITATIONS carrying, lifting, bending, sitting, standing, squatting, and reach over head   PARTICIPATION LIMITATIONS: meal prep, cleaning, laundry, driving, community activity, and occupation   PERSONAL FACTORS 1 comorbidity: depression  are also affecting patient's functional outcome.    REHAB POTENTIAL: Good   CLINICAL DECISION MAKING: Stable/uncomplicated   EVALUATION COMPLEXITY: Low     GOALS:   SHORT TERM GOALS:    Pt will be independent and compliant with HEP for improved pain, strength, mobility, and function.  Baseline: Goal status: GOAL MET Target date: 10/23/2021   2.  Pt will demo improved core strength as evidenced by progression of core exercises without adverse effects for improved tolerance to daily activities and mobility.  Baseline:  Goal status: GOAL MET Target  date: 10/30/2021   3.  Pt will demo good control with core exercises for improved neuromuscular control and core strength to perform work activities and functional lifting.  Baseline:  Goal status: GOAL MET Target date:  11/13/2021     4.  Pt will cont with walking program and report less pain with walking program.  Baseline:  Goal status: GOAL MET Target date:  11/13/2021       LONG TERM GOALS: Target date: 12/30/2021    Pt will demonstrate good form and body mechanics with squat to lift in order to perform functional lifting safely with reduced stress to lumbar.   Baseline:  Goal status: GOAL MET   2.  Pt  will demo R LE strength to = L LE strength for performance of functional mobility and to assist with performing occupational activities.  Baseline:  Goal status: PROGRESSING   3.  Pt will return to work without adverse effects.  Baseline:  Goal status: PROGRESSING   4.  Pt will be able to perform her functional lifting and carrying as allowed by MD without significant pain and difficulty.  Baseline:  Goal status: PROGRESSING         PLAN: PT FREQUENCY: 1x/week   PT DURATION:  3-4 weeks   PLANNED INTERVENTIONS: Therapeutic exercises, Therapeutic activity, Neuromuscular re-education, Balance training, Gait training, Patient/Family education, Joint mobilization, Stair training, Aquatic Therapy, Dry Needling, Electrical stimulation, Spinal mobilization, Cryotherapy, Moist heat, Taping, Ultrasound, Manual therapy, and Re-evaluation.   PLAN FOR NEXT SESSION:   Cont with core strengthening and flexibility per protocol.  Cont with body mechanics training.  Pt has orders from MD that she can lift up to 37 lbs.  Selinda Michaels III PT, DPT 12/03/21 1:40 PM

## 2021-12-02 ENCOUNTER — Ambulatory Visit (HOSPITAL_BASED_OUTPATIENT_CLINIC_OR_DEPARTMENT_OTHER): Payer: Commercial Managed Care - PPO | Admitting: Physical Therapy

## 2021-12-02 ENCOUNTER — Encounter (HOSPITAL_BASED_OUTPATIENT_CLINIC_OR_DEPARTMENT_OTHER): Payer: Self-pay | Admitting: Physical Therapy

## 2021-12-02 DIAGNOSIS — M5459 Other low back pain: Secondary | ICD-10-CM

## 2021-12-02 DIAGNOSIS — M79604 Pain in right leg: Secondary | ICD-10-CM

## 2021-12-02 DIAGNOSIS — M6281 Muscle weakness (generalized): Secondary | ICD-10-CM

## 2021-12-07 NOTE — Therapy (Signed)
OUTPATIENT PHYSICAL THERAPY TREATMENT NOTE    Patient Name: Renee Li MRN: [id number] DOB:Nov 17, 1970, 51 y.o., female Today's Date: 12/09/2021     END OF SESSION:   PT End of Session - 12/09/21 1133     Visit Number 14    Number of Visits 17    Date for PT Re-Evaluation 12/30/21    Authorization Type [employer]    PT Start Time 1115    PT Stop Time 7209    PT Time Calculation (min) 43 min    Activity Tolerance Patient tolerated treatment well    Behavior During Therapy WFL for tasks assessed/performed                         Past Medical History:  Diagnosis Date   Anemia    Depression    Heart murmur    never caused any problems   Hypertension    Migraines    Past Surgical History:  Procedure Laterality Date   APPENDECTOMY     WISDOM TOOTH EXTRACTION     Patient Active Problem List   Diagnosis Date Noted   Polycythemia 06/30/2021   Neck fullness 06/30/2021   Essential hypertension 05/28/2018   Class 1 obesity due to excess calories without serious comorbidity with body mass index (BMI) of 30.0 to 30.9 in adult 05/28/2018   Cervicalgia 10/14/2016   Scalp laceration, sequela 07/22/2016   Concussion with loss of consciousness 07/15/2016   Osteoarthritis cervical spine 07/15/2016   Syncope and collapse 07/15/2016   Depression 04/09/2013     REFERRING PROVIDER: Newman Pies, MD    REFERRING DIAG: M51.26 (ICD-10-CM) - Other intervertebral disc displacement, lumbar region ; herniated nucleus pulposus, lumbar   Rationale for Evaluation and Treatment Rehabilitation   THERAPY DIAG:  Other low back pain   Pain in right leg   Muscle weakness (generalized)   ONSET DATE: DOS 07/08/2021   SUBJECTIVE:                                                                                                                                                                                            SUBJECTIVE STATEMENT:  -Pt is 5 months s/p R L1-2 and  L2-3 diskectomy. -Pt has returned to work.  She does have pain with work activities though is "tolerable".  She ices her back and is not using pain medication.  She occasionally uses tylenol after work.  Warm shower and biofreeze helps pt go to sleep.  Pt has pain in R anterior hip with performing a sit to stand transfer after a workday.    -  Pt reports compliance with  HEP.   -PAIN LEVEL:  2/10 pain in R sided lumbar and R anterior hip and proximal thigh -FUNCTIONAL IMPROVEMENTS:  Ambulation distance averages 5-7 miles t/o entire day based on steps, stability, lifting.  Doing occupational activities well -FUNCTIONAL DEFICITS:  opening/closing galley service door at work, lifting including pots/pans, gardening    PERTINENT HISTORY:  R L1-2 and L2-3 diskectomy on 07/08/2021 depression   PAIN:  Are you having pain? Yes, dull ache  NPRS:  Current:  0/10, Worst:  4/10, Best: 0/10 Location:  R sided lower lumbar.  Pt denies leg pain and N/T.      PRECAUTIONS: Back; Pt has B/L/T precautions.  Pt able to lift up to 37 lbs per MD message.   WEIGHT BEARING RESTRICTIONS No   FALLS:  Has patient fallen in last 6 months? 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   PATIENT GOALS to get back to work     OBJECTIVE:    DIAGNOSTIC FINDINGS:  Lumbar MRI on 06/19/21 (prior to surgery): FINDINGS: Segmentation:  Standard.   Alignment:  No significant listhesis.   Vertebrae: Minor loss of height at the superior endplate of L3 eccentric to the right with underlying marrow edema. Vertebral body heights are otherwise maintained. No suspicious osseous lesion.   Conus medullaris and cauda equina: Conus extends to the L1 level. Conus and cauda equina appear normal.   Paraspinal and other soft tissues: Unremarkable.   Disc levels:   T12-L1: Shallow central  protrusion.  No canal or foraminal stenosis.   L1-L2: Right central/subarticular disc protrusion. No canal stenosis. Narrowing of the right subarticular recess with potential compression of right L2 nerve root. No foraminal stenosis.   L2-L3: Right subarticular disc protrusion superimposed on mild disc bulge. No canal stenosis. Effacement of right subarticular recess with compression of right L3 nerve root. There is also a superiorly directed extrusion component of the disc herniation that likely compresses the exiting L2 nerve root. No foraminal stenosis.   L3-L4:  Minimal disc bulge.  No canal or foraminal stenosis.   L4-L5: Minimal disc bulge with a small central extrusion extending just below disc level. Mild facet arthropathy. No canal or foraminal stenosis.   L5-S1:  Mild facet arthropathy.  No canal or foraminal stenosis.   IMPRESSION: Minor loss of height at the superior endplate of L3. Underlying marrow edema may reflect recent compression fracture or may be on a degenerative basis.   Multilevel degenerative changes as detailed above. Disc herniations at at L1-L2 and L2-L3. Compression of the right L3 nerve root at L2-L3. Probable compression of right L2 nerve root at L2-L3. Possible compression of right L2 nerve root at L1-L2.         TODAY'S TREATMENT   THERAPEUTIC EXERCISE: -Reviewed response to prior Rx, pain levels, reported functional progress and deficits, and HEP compliance. -Pt performed:  Nustep at L6 x 5 mins, just LE's Supine bridge 2x10 with 3 sec hold with feet on table and 2x10 with feet on airex S/L clams with GTB 2x10 bilat Qped birddog 2x10 reps  Lateral band walks with RTB above knees for 2 laps at rail without UE support.     Pallof Press with GTB 2 x 10 reps each SLS with TrA 2x20 sec  Wall squats with TrA  2x10 reps Squats with TrA x10 in front of mirror for visual cuing for correct form   PATIENT EDUCATION:  Education details:   squatting form, POC, and exercise form and purpose.  PT answered Pt's questions.   Person educated: Patient Education method: Explanation, Demonstration, Tactile cues, Verbal cues Education comprehension: verbalized understanding, returned demonstration, verbal cues required, tactile cues required, and needs further education     HOME EXERCISE PROGRAM: Access Code: [facility] URL: [email or web address] Date: 10/14/2021 Prepared by: Ronny Flurry Updated HEP: - Sidelying Hip Abduction  - 1 x daily - 5-6 x weekly - 2 sets - 10 reps - Supine Active Straight Leg Raise  - 1 x daily - 5 x weekly - 2 sets - 10 reps - Standing Heel Raise with Support  - 1 x daily - 6 x weekly - 2 sets - 10 reps - Standing Shoulder Row with Anchored Resistance  - 1 x daily - 3-4 x weekly - 2 sets - 10 reps    ASSESSMENT:   CLINICAL IMPRESSION: Pt is progressing well in all areas.  She has not been using as much medication and has been using ice, warm shower, and biofreeze for comfort and pain control.  Pt has improved tolerance to activity.  She is performing work activities with tolerable pain.  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  Pt is progressing with core and LE strength and is progressing with core exercises per protocol well.  She responded well to Rx reporting 2/10 pain in lumbar, no change, though hip and LE are feeling better.  Pt should benefit from cont skilled PT services per protocol to address ongoing goals and to restore PLOF.    OBJECTIVE IMPAIRMENTS decreased activity tolerance, decreased endurance, decreased mobility, decreased strength, improper body mechanics, and pain.    ACTIVITY LIMITATIONS carrying, lifting, bending, sitting, standing, squatting, and reach over head   PARTICIPATION LIMITATIONS: meal prep, cleaning, laundry, driving, community activity, and occupation   PERSONAL FACTORS 1 comorbidity: depression  are also affecting patient's  functional outcome.    REHAB POTENTIAL: Good   CLINICAL DECISION MAKING: Stable/uncomplicated   EVALUATION COMPLEXITY: Low     GOALS:   SHORT TERM GOALS:    Pt will be independent and compliant with HEP for improved pain, strength, mobility, and function.  Baseline: Goal status: GOAL MET Target date: 10/23/2021   2.  Pt will demo improved core strength as evidenced by progression of core exercises without adverse effects for improved tolerance to daily activities and mobility.  Baseline:  Goal status: GOAL MET Target date: 10/30/2021   3.  Pt will demo good control with core exercises for improved neuromuscular control and core strength to perform work activities and functional lifting.  Baseline:  Goal status: GOAL MET Target date:  11/13/2021     4.  Pt will cont with walking program and report less pain with walking program.  Baseline:  Goal status: GOAL MET Target date:  11/13/2021       LONG TERM GOALS: Target date: 12/30/2021    Pt will demonstrate good form and body mechanics with squat to lift in order to perform functional lifting safely with reduced stress to lumbar.   Baseline:  Goal status: GOAL MET   2.  Pt will demo R LE strength to = L LE strength for performance of functional mobility and to assist with performing occupational activities.  Baseline:  Goal status: PROGRESSING   3.  Pt will return to  work without adverse effects.  Baseline:  Goal status: PROGRESSING   4.  Pt will be able to perform her functional lifting and carrying as allowed by MD without significant pain and difficulty.  Baseline:  Goal status: PROGRESSING         PLAN: PT FREQUENCY: 1x/week   PT DURATION:  3-4 weeks   PLANNED INTERVENTIONS: Therapeutic exercises, Therapeutic activity, Neuromuscular re-education, Balance training, Gait training, Patient/Family education, Joint mobilization, Stair training, Aquatic Therapy, Dry Needling, Electrical stimulation, Spinal  mobilization, Cryotherapy, Moist heat, Taping, Ultrasound, Manual therapy, and Re-evaluation.   PLAN FOR NEXT SESSION:   Cont with core strengthening and flexibility per protocol.  Cont with body mechanics training.  Pt has orders from MD that she can lift up to 37 lbs.   Selinda Michaels III PT, DPT 12/09/21 1:28 PM

## 2021-12-09 ENCOUNTER — Encounter (HOSPITAL_BASED_OUTPATIENT_CLINIC_OR_DEPARTMENT_OTHER): Payer: Self-pay | Admitting: Physical Therapy

## 2021-12-09 ENCOUNTER — Ambulatory Visit (HOSPITAL_BASED_OUTPATIENT_CLINIC_OR_DEPARTMENT_OTHER): Payer: Commercial Managed Care - PPO | Admitting: Physical Therapy

## 2021-12-09 DIAGNOSIS — M79604 Pain in right leg: Secondary | ICD-10-CM

## 2021-12-09 DIAGNOSIS — M5459 Other low back pain: Secondary | ICD-10-CM | POA: Diagnosis not present

## 2021-12-09 DIAGNOSIS — M6281 Muscle weakness (generalized): Secondary | ICD-10-CM

## 2021-12-17 NOTE — Therapy (Signed)
OUTPATIENT PHYSICAL THERAPY TREATMENT NOTE    Patient Name: Renee Li MRN: [id number] DOB:09-29-1970, 51 y.o., female Today's Date: 12/18/2021     END OF SESSION:   PT End of Session - 12/18/21 1524     Visit Number 15    Number of Visits 17    Date for PT Re-Evaluation 12/30/21    Authorization Type [employer]    PT Start Time 5643    PT Stop Time 1600    PT Time Calculation (min) 41 min    Activity Tolerance Patient tolerated treatment well;No increased pain    Behavior During Therapy WFL for tasks assessed/performed                          Past Medical History:  Diagnosis Date   Anemia    Depression    Heart murmur    never caused any problems   Hypertension    Migraines    Past Surgical History:  Procedure Laterality Date   APPENDECTOMY     WISDOM TOOTH EXTRACTION     Patient Active Problem List   Diagnosis Date Noted   Polycythemia 06/30/2021   Neck fullness 06/30/2021   Essential hypertension 05/28/2018   Class 1 obesity due to excess calories without serious comorbidity with body mass index (BMI) of 30.0 to 30.9 in adult 05/28/2018   Cervicalgia 10/14/2016   Scalp laceration, sequela 07/22/2016   Concussion with loss of consciousness 07/15/2016   Osteoarthritis cervical spine 07/15/2016   Syncope and collapse 07/15/2016   Depression 04/09/2013     REFERRING PROVIDER: Newman Pies, MD    REFERRING DIAG: M51.26 (ICD-10-CM) - Other intervertebral disc displacement, lumbar region ; herniated nucleus pulposus, lumbar   Rationale for Evaluation and Treatment Rehabilitation   THERAPY DIAG:  Other low back pain   Pain in right leg   Muscle weakness (generalized)   ONSET DATE: DOS 07/08/2021   SUBJECTIVE:                                                                                                                                                                                            SUBJECTIVE STATEMENT:  -Pt is 5  months s/p R L1-2 and L2-3 diskectomy. -Pt states her R LE continues to have weakness which she can tell with performing stairs.  Pt feels about the same overall.  She is doing more activity including standing on her feet, squatting, and work activities.  Pt reports she has been performing some of her HEP though not all of it due to work.  Pt ambulated 8 miles  total one day at work.  Pt states MD is allowing her to perform light jogging, but she has not tried yet.  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  Pt uses ice, warm shower, and icy hot on evenings after work.       PERTINENT HISTORY:  R L1-2 and L2-3 diskectomy on 07/08/2021 depression   PAIN:  Are you having pain? Yes, dull ache  NPRS:  Current:  2/10, Worst:  4/10, Best: 0/10 Location:  R sided lower lumbar.  Pt denies LE pain at rest though reports 2/10 R thigh pain with stairs and activity.      PRECAUTIONS: Back; Pt has B/L/T precautions.  Pt able to lift up to 37 lbs per MD message.   WEIGHT BEARING RESTRICTIONS No   FALLS:  Has patient fallen in last 6 months? 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   PATIENT GOALS to get back to work     OBJECTIVE:    DIAGNOSTIC FINDINGS:  Lumbar MRI on 06/19/21 (prior to surgery): FINDINGS: Segmentation:  Standard.   Alignment:  No significant listhesis.   Vertebrae: Minor loss of height at the superior endplate of L3 eccentric to the right with underlying marrow edema. Vertebral body heights are otherwise maintained. No suspicious osseous lesion.   Conus medullaris and cauda equina: Conus extends to the L1 level. Conus and cauda equina appear normal.   Paraspinal and other soft tissues: Unremarkable.   Disc levels:   T12-L1: Shallow central protrusion.  No canal  or foraminal stenosis.   L1-L2: Right central/subarticular disc protrusion. No canal stenosis. Narrowing of the right subarticular recess with potential compression of right L2 nerve root. No foraminal stenosis.   L2-L3: Right subarticular disc protrusion superimposed on mild disc bulge. No canal stenosis. Effacement of right subarticular recess with compression of right L3 nerve root. There is also a superiorly directed extrusion component of the disc herniation that likely compresses the exiting L2 nerve root. No foraminal stenosis.   L3-L4:  Minimal disc bulge.  No canal or foraminal stenosis.   L4-L5: Minimal disc bulge with a small central extrusion extending just below disc level. Mild facet arthropathy. No canal or foraminal stenosis.   L5-S1:  Mild facet arthropathy.  No canal or foraminal stenosis.   IMPRESSION: Minor loss of height at the superior endplate of L3. Underlying marrow edema may reflect recent compression fracture or may be on a degenerative basis.   Multilevel degenerative changes as detailed above. Disc herniations at at L1-L2 and L2-L3. Compression of the right L3 nerve root at L2-L3. Probable compression of right L2 nerve root at L2-L3. Possible compression of right L2 nerve root at L1-L2.         TODAY'S TREATMENT   THERAPEUTIC EXERCISE: -Reviewed response to prior Rx, pain levels, reported functional progress and deficits, and HEP compliance. -Pt performed:  Nustep at L5-6 x 5 mins, just LE's Supine bridge SL in figure 4 position 2x10 reps S/L clams with GTB 2x10 bilat Qped birddog 2x10 reps  Lateral band walks with RTB above knees for 3 laps at rail without UE support.     Pallof Press with GTB 2 x 10 reps each SLS with TrA 3x20 sec  Squats with TrA 2x10  in front of mirror for visual cuing for correct form  Prone planks on knees 1x10 sec and 2x10-15 sec  Step ups on 8 inch step 2x10 bilat with TrA   PATIENT EDUCATION:  Education details:   squatting form, POC, and exercise form and purpose.  PT answered Pt's questions.   Person educated: Patient Education method: Explanation, Demonstration, Tactile cues, Verbal cues Education comprehension: verbalized understanding, returned demonstration, verbal cues required, tactile cues required, and needs further education     HOME EXERCISE PROGRAM: Access Code: [facility] URL: [email or web address] Date: 10/14/2021 Prepared by: Ronny Flurry Updated HEP: - Sidelying Hip Abduction  - 1 x daily - 5-6 x weekly - 2 sets - 10 reps - Supine Active Straight Leg Raise  - 1 x daily - 5 x weekly - 2 sets - 10 reps - Standing Heel Raise with Support  - 1 x daily - 6 x weekly - 2 sets - 10 reps - Standing Shoulder Row with Anchored Resistance  - 1 x daily - 3-4 x weekly - 2 sets - 10 reps    ASSESSMENT:   CLINICAL IMPRESSION: Pt is at work and does have pain with work activities.  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  She is improving with core strength as evidenced by performance of core exercises.  PT progressed core exercises per protocol and Pt performed exercises well without c/o's.  She is progressing toward her goals.  She responded well to Rx reporting no change in pain after Rx.  Pt should benefit from cont skilled PT services per protocol to address ongoing goals and to restore PLOF.      OBJECTIVE IMPAIRMENTS decreased activity tolerance, decreased endurance, decreased mobility, decreased strength, improper body mechanics, and pain.    ACTIVITY LIMITATIONS carrying, lifting, bending, sitting, standing, squatting, and reach over head   PARTICIPATION LIMITATIONS: meal prep, cleaning, laundry, driving, community activity, and occupation   PERSONAL FACTORS 1 comorbidity: depression  are also affecting patient's functional outcome.    REHAB POTENTIAL: Good   CLINICAL DECISION MAKING: Stable/uncomplicated   EVALUATION  COMPLEXITY: Low     GOALS:   SHORT TERM GOALS:    Pt will be independent and compliant with HEP for improved pain, strength, mobility, and function.  Baseline: Goal status: GOAL MET Target date: 10/23/2021   2.  Pt will demo improved core strength as evidenced by progression of core exercises without adverse effects for improved tolerance to daily activities and mobility.  Baseline:  Goal status: GOAL MET Target date: 10/30/2021   3.  Pt will demo good control with core exercises for improved neuromuscular control and core strength to perform work activities and functional lifting.  Baseline:  Goal status: GOAL MET Target date:  11/13/2021     4.  Pt will cont with walking program and report less pain with walking program.  Baseline:  Goal status: GOAL MET Target date:  11/13/2021       LONG TERM GOALS: Target date: 12/30/2021    Pt will demonstrate good form and body mechanics with squat to lift in order to perform functional lifting safely with reduced stress to lumbar.   Baseline:  Goal status: GOAL MET   2.  Pt will demo R LE strength to = L LE strength for performance of functional mobility and to assist with performing occupational activities.  Baseline:  Goal status: PROGRESSING   3.  Pt will return to work without adverse effects.  Baseline:  Goal status: PROGRESSING   4.  Pt will be able to perform her functional lifting and carrying as allowed by MD without significant pain and difficulty.  Baseline:  Goal status: PROGRESSING         PLAN: PT FREQUENCY: 1x/week   PT DURATION:  3-4 weeks   PLANNED INTERVENTIONS: Therapeutic exercises, Therapeutic activity, Neuromuscular re-education, Balance training, Gait training, Patient/Family education, Joint mobilization, Stair training, Aquatic Therapy, Dry Needling, Electrical stimulation, Spinal mobilization, Cryotherapy, Moist heat, Taping, Ultrasound, Manual therapy, and Re-evaluation.   PLAN FOR NEXT SESSION:    Cont with core strengthening and flexibility per protocol.  Cont with body mechanics training.  Pt has orders from MD that she can lift up to 37 lbs.   Selinda Michaels III PT, DPT 12/18/21 9:31 PM

## 2021-12-18 ENCOUNTER — Encounter (HOSPITAL_BASED_OUTPATIENT_CLINIC_OR_DEPARTMENT_OTHER): Payer: Self-pay | Admitting: Physical Therapy

## 2021-12-18 ENCOUNTER — Ambulatory Visit (HOSPITAL_BASED_OUTPATIENT_CLINIC_OR_DEPARTMENT_OTHER): Payer: Commercial Managed Care - PPO | Attending: Neurosurgery | Admitting: Physical Therapy

## 2021-12-18 DIAGNOSIS — M6281 Muscle weakness (generalized): Secondary | ICD-10-CM | POA: Insufficient documentation

## 2021-12-18 DIAGNOSIS — M5459 Other low back pain: Secondary | ICD-10-CM | POA: Diagnosis present

## 2021-12-18 DIAGNOSIS — M79604 Pain in right leg: Secondary | ICD-10-CM | POA: Insufficient documentation

## 2021-12-23 ENCOUNTER — Ambulatory Visit: Payer: Commercial Managed Care - PPO | Admitting: Nurse Practitioner

## 2021-12-25 ENCOUNTER — Ambulatory Visit (HOSPITAL_BASED_OUTPATIENT_CLINIC_OR_DEPARTMENT_OTHER): Payer: Commercial Managed Care - PPO | Admitting: Physical Therapy

## 2021-12-25 DIAGNOSIS — M79604 Pain in right leg: Secondary | ICD-10-CM

## 2021-12-25 DIAGNOSIS — M5459 Other low back pain: Secondary | ICD-10-CM

## 2021-12-25 DIAGNOSIS — M6281 Muscle weakness (generalized): Secondary | ICD-10-CM

## 2021-12-25 NOTE — Therapy (Signed)
OUTPATIENT PHYSICAL THERAPY TREATMENT NOTE    Patient Name: Renee Li MRN: [id number] DOB:1971-01-03, 51 y.o., female Today's Date: 12/26/2021     END OF SESSION:   PT End of Session - 12/25/21 1439     Visit Number 16    Number of Visits 17    Date for PT Re-Evaluation 12/30/21    Authorization Type [employer]    PT Start Time 4492    PT Stop Time 1432    PT Time Calculation (min) 40 min    Activity Tolerance Patient tolerated treatment well;No increased pain    Behavior During Therapy WFL for tasks assessed/performed                           Past Medical History:  Diagnosis Date   Anemia    Depression    Heart murmur    never caused any problems   Hypertension    Migraines    Past Surgical History:  Procedure Laterality Date   APPENDECTOMY     WISDOM TOOTH EXTRACTION     Patient Active Problem List   Diagnosis Date Noted   Polycythemia 06/30/2021   Neck fullness 06/30/2021   Essential hypertension 05/28/2018   Class 1 obesity due to excess calories without serious comorbidity with body mass index (BMI) of 30.0 to 30.9 in adult 05/28/2018   Cervicalgia 10/14/2016   Scalp laceration, sequela 07/22/2016   Concussion with loss of consciousness 07/15/2016   Osteoarthritis cervical spine 07/15/2016   Syncope and collapse 07/15/2016   Depression 04/09/2013     REFERRING PROVIDER: Newman Pies, MD    REFERRING DIAG: M51.26 (ICD-10-CM) - Other intervertebral disc displacement, lumbar region ; herniated nucleus pulposus, lumbar   Rationale for Evaluation and Treatment Rehabilitation   THERAPY DIAG:  Other low back pain   Pain in right leg   Muscle weakness (generalized)   ONSET DATE: DOS 07/08/2021   SUBJECTIVE:                                                                                                                                                                                            SUBJECTIVE STATEMENT: -Pt is 5  months s/p R L1-2 and L2-3 diskectomy.  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  She is able to open the galley service door without increased pain.  Pt has someone at work that puts her suitcase in the overhead compartments.  Pt has been using her same routine of ice, warm shower, and icy hot on evenings at work.  She denies pain currently.  Pt reports compliance with HEP.    PERTINENT HISTORY:  R L1-2 and L2-3 diskectomy on 07/08/2021 depression   PAIN:  Are you having pain? Yes, dull ache  NPRS:  Current:  0/10, Worst:  4/10, Best: 0/10 Location:  R sided lower lumbar.  Pt denies LE pain at rest though reports 2/10 R thigh pain with stairs and activity.      PRECAUTIONS: Back; Pt has B/L/T precautions.  Pt able to lift up to 37 lbs per MD message.   WEIGHT BEARING RESTRICTIONS No   FALLS:  Has patient fallen in last 6 months? 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   PATIENT GOALS to get back to work     OBJECTIVE:    DIAGNOSTIC FINDINGS:  Lumbar MRI on 06/19/21 (prior to surgery): FINDINGS: Segmentation:  Standard.   Alignment:  No significant listhesis.   Vertebrae: Minor loss of height at the superior endplate of L3 eccentric to the right with underlying marrow edema. Vertebral body heights are otherwise maintained. No suspicious osseous lesion.   Conus medullaris and cauda equina: Conus extends to the L1 level. Conus and cauda equina appear normal.   Paraspinal and other soft tissues: Unremarkable.   Disc levels:   T12-L1: Shallow central protrusion.  No canal or foraminal stenosis.   L1-L2: Right central/subarticular disc protrusion. No canal stenosis. Narrowing of the right subarticular recess with potential compression of right L2 nerve root. No foraminal stenosis.    L2-L3: Right subarticular disc protrusion superimposed on mild disc bulge. No canal stenosis. Effacement of right subarticular recess with compression of right L3 nerve root. There is also a superiorly directed extrusion component of the disc herniation that likely compresses the exiting L2 nerve root. No foraminal stenosis.   L3-L4:  Minimal disc bulge.  No canal or foraminal stenosis.   L4-L5: Minimal disc bulge with a small central extrusion extending just below disc level. Mild facet arthropathy. No canal or foraminal stenosis.   L5-S1:  Mild facet arthropathy.  No canal or foraminal stenosis.   IMPRESSION: Minor loss of height at the superior endplate of L3. Underlying marrow edema may reflect recent compression fracture or may be on a degenerative basis.   Multilevel degenerative changes as detailed above. Disc herniations at at L1-L2 and L2-L3. Compression of the right L3 nerve root at L2-L3. Probable compression of right L2 nerve root at L2-L3. Possible compression of right L2 nerve root at L1-L2.         TODAY'S TREATMENT   THERAPEUTIC EXERCISE: -Reviewed response to prior Rx, pain levels, reported functional progress and deficits, and HEP compliance. -Pt performed:  Nustep at L5-6 x 6 mins, UE's/LE's Supine bridge SL in figure 4 position 2x10 reps S/L clams with GTB 2x10 bilat Standing D2 with TrA with RTB 2x10 bilat Qped birddog 2x10 reps  Lateral band walks with GTB above knees for 3 laps at rail without UE support.     Pallof Press with GTB 2 x 10 reps each SLS with TrA x20 sec on floor and 2x20 sec on airex with occasional UE support  Squats with TrA 2x10 in front of mirror for visual cuing for correct form  Prone planks on knees 3x15 sec    PATIENT EDUCATION:  Education details:  squatting form,  POC, and exercise form and purpose.  PT answered Pt's questions.   Person educated: Patient Education method: Explanation, Demonstration, Tactile cues, Verbal  cues Education comprehension: verbalized understanding, returned demonstration, verbal cues required, tactile cues required, and needs further education     HOME EXERCISE PROGRAM: Access Code: [facility] URL: [email or web address] Date: 10/14/2021 Prepared by: Ronny Flurry Updated HEP: - Sidelying Hip Abduction  - 1 x daily - 5-6 x weekly - 2 sets - 10 reps - Supine Active Straight Leg Raise  - 1 x daily - 5 x weekly - 2 sets - 10 reps - Standing Heel Raise with Support  - 1 x daily - 6 x weekly - 2 sets - 10 reps - Standing Shoulder Row with Anchored Resistance  - 1 x daily - 3-4 x weekly - 2 sets - 10 reps    ASSESSMENT:   CLINICAL IMPRESSION: Pt is making excellent progress in PT.  She continues to work and reports improved pain with work activities including opening/closing the [employer].  She is improving with core strength as evidenced by performance of core exercises.  Pt performed core and LE strengthening exercises per protocol well without c/o's.  She demonstrates improved balance with SLS.  She is progressing toward her goals.  She presented to Rx with no pain and responded well to Rx having no pain after Rx.       OBJECTIVE IMPAIRMENTS decreased activity tolerance, decreased endurance, decreased mobility, decreased strength, improper body mechanics, and pain.    ACTIVITY LIMITATIONS carrying, lifting, bending, sitting, standing, squatting, and reach over head   PARTICIPATION LIMITATIONS: meal prep, cleaning, laundry, driving, community activity, and occupation   PERSONAL FACTORS 1 comorbidity: depression  are also affecting patient's functional outcome.    REHAB POTENTIAL: Good   CLINICAL DECISION MAKING: Stable/uncomplicated   EVALUATION COMPLEXITY: Low     GOALS:   SHORT TERM GOALS:    Pt will be independent and compliant with HEP for improved pain, strength, mobility, and function.  Baseline: Goal status: GOAL MET Target date:  10/23/2021   2.  Pt will demo improved core strength as evidenced by progression of core exercises without adverse effects for improved tolerance to daily activities and mobility.  Baseline:  Goal status: GOAL MET Target date: 10/30/2021   3.  Pt will demo good control with core exercises for improved neuromuscular control and core strength to perform work activities and functional lifting.  Baseline:  Goal status: GOAL MET Target date:  11/13/2021     4.  Pt will cont with walking program and report less pain with walking program.  Baseline:  Goal status: GOAL MET Target date:  11/13/2021       LONG TERM GOALS: Target date: 12/30/2021    Pt will demonstrate good form and body mechanics with squat to lift in order to perform functional lifting safely with reduced stress to lumbar.   Baseline:  Goal status: GOAL MET   2.  Pt will demo R LE strength to = L LE strength for performance of functional mobility and to assist with performing occupational activities.  Baseline:  Goal status: PROGRESSING   3.  Pt will return to work without adverse effects.  Baseline:  Goal status: PROGRESSING   4.  Pt will be able to perform her functional lifting and carrying as allowed by MD without significant pain and difficulty.  Baseline:  Goal status: PROGRESSING         PLAN: PT FREQUENCY: 1x/week  PT DURATION:  3-4 weeks   PLANNED INTERVENTIONS: Therapeutic exercises, Therapeutic activity, Neuromuscular re-education, Balance training, Gait training, Patient/Family education, Joint mobilization, Stair training, Aquatic Therapy, Dry Needling, Electrical stimulation, Spinal mobilization, Cryotherapy, Moist heat, Taping, Ultrasound, Manual therapy, and Re-evaluation.   PLAN FOR NEXT SESSION:   PN vs discharge next Rx.  Cont with core strengthening and flexibility per protocol.  Pt has orders from MD that she can lift up to 37 lbs.   Selinda Michaels III PT, DPT 12/26/21 12:15  PM

## 2021-12-26 ENCOUNTER — Encounter (HOSPITAL_BASED_OUTPATIENT_CLINIC_OR_DEPARTMENT_OTHER): Payer: Self-pay | Admitting: Physical Therapy

## 2022-01-01 ENCOUNTER — Encounter (HOSPITAL_BASED_OUTPATIENT_CLINIC_OR_DEPARTMENT_OTHER): Payer: Self-pay | Admitting: Physical Therapy

## 2022-01-07 ENCOUNTER — Ambulatory Visit (HOSPITAL_BASED_OUTPATIENT_CLINIC_OR_DEPARTMENT_OTHER): Payer: Commercial Managed Care - PPO | Admitting: Physical Therapy

## 2022-01-07 ENCOUNTER — Encounter (HOSPITAL_BASED_OUTPATIENT_CLINIC_OR_DEPARTMENT_OTHER): Payer: Self-pay | Admitting: Physical Therapy

## 2022-01-07 DIAGNOSIS — M79604 Pain in right leg: Secondary | ICD-10-CM

## 2022-01-07 DIAGNOSIS — M5459 Other low back pain: Secondary | ICD-10-CM

## 2022-01-07 DIAGNOSIS — M6281 Muscle weakness (generalized): Secondary | ICD-10-CM

## 2022-01-07 NOTE — Therapy (Signed)
OUTPATIENT PHYSICAL THERAPY TREATMENT NOTE    Patient Name: Renee Li MRN: [id number] DOB:10/03/1970, 51 y.o., female Today's Date: 01/08/2022     END OF SESSION:   PT End of Session - 01/07/22 1445     Visit Number 17    Number of Visits 17    Authorization Type [employer]    PT Start Time 1350    PT Stop Time 1437    PT Time Calculation (min) 47 min    Activity Tolerance Patient tolerated treatment well;No increased pain    Behavior During Therapy WFL for tasks assessed/performed                            Past Medical History:  Diagnosis Date   Anemia    Depression    Heart murmur    never caused any problems   Hypertension    Migraines    Past Surgical History:  Procedure Laterality Date   APPENDECTOMY     WISDOM TOOTH EXTRACTION     Patient Active Problem List   Diagnosis Date Noted   Polycythemia 06/30/2021   Neck fullness 06/30/2021   Essential hypertension 05/28/2018   Class 1 obesity due to excess calories without serious comorbidity with body mass index (BMI) of 30.0 to 30.9 in adult 05/28/2018   Cervicalgia 10/14/2016   Scalp laceration, sequela 07/22/2016   Concussion with loss of consciousness 07/15/2016   Osteoarthritis cervical spine 07/15/2016   Syncope and collapse 07/15/2016   Depression 04/09/2013     REFERRING PROVIDER: Jenkins, Jeffrey, MD    REFERRING DIAG: M51.26 (ICD-10-CM) - Other intervertebral disc displacement, lumbar region ; herniated nucleus pulposus, lumbar   Rationale for Evaluation and Treatment Rehabilitation   THERAPY DIAG:  Other low back pain   Pain in right leg   Muscle weakness (generalized)   ONSET DATE: DOS 07/08/2021   SUBJECTIVE:                                                                                                                                                                                            SUBJECTIVE STATEMENT: -Pt is nearly 6 months s/p R L1-2 and L2-3  diskectomy.  Pt was performing some weeding and using a hoe in the garden this AM.  Pt reports having increased pain afterwards.  Pt reports her back was uncomfortable driving 90 mins to [place].  Pt bought a lumbar support for her car.  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  She can   have a pinch in her back.  Pt has someone at work that puts her suitcase in the overhead compartments.  Pt has been using her same routine of ice, warm shower, and icy hot on evenings at work.  Pt reports compliance with HEP.  Pt denies any adverse effects after prior Rx.   "I feel good with my work.  My pain is tolerable."  Pt reports she is able to perform her functional lifting and carrying without significant pain and difficulty.   PERTINENT HISTORY:  R L1-2 and L2-3 diskectomy on 07/08/2021 depression   PAIN:  Are you having pain? Yes, dull ache  NPRS:  Current:  2/10, Worst:  5/10, Best: 0/10 Location:  central lower lumbar  Worst pain is after a long day of work     PRECAUTIONS: Back; Pt has B/L/T precautions.  Pt able to lift up to 37 lbs per MD message.   WEIGHT BEARING RESTRICTIONS No   FALLS:  Has patient fallen in last 6 months? 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   PATIENT GOALS to get back to work     OBJECTIVE:    DIAGNOSTIC FINDINGS:  Lumbar MRI on 06/19/21 (prior to surgery): FINDINGS: Segmentation:  Standard.   Alignment:  No significant listhesis.   Vertebrae: Minor loss of height at the superior endplate of L3 eccentric to the right with underlying marrow edema. Vertebral body heights are otherwise maintained. No suspicious osseous lesion.   Conus medullaris and cauda equina: Conus extends to the L1 level. Conus and cauda equina appear normal.   Paraspinal  and other soft tissues: Unremarkable.   Disc levels:   T12-L1: Shallow central protrusion.  No canal or foraminal stenosis.   L1-L2: Right central/subarticular disc protrusion. No canal stenosis. Narrowing of the right subarticular recess with potential compression of right L2 nerve root. No foraminal stenosis.   L2-L3: Right subarticular disc protrusion superimposed on mild disc bulge. No canal stenosis. Effacement of right subarticular recess with compression of right L3 nerve root. There is also a superiorly directed extrusion component of the disc herniation that likely compresses the exiting L2 nerve root. No foraminal stenosis.   L3-L4:  Minimal disc bulge.  No canal or foraminal stenosis.   L4-L5: Minimal disc bulge with a small central extrusion extending just below disc level. Mild facet arthropathy. No canal or foraminal stenosis.   L5-S1:  Mild facet arthropathy.  No canal or foraminal stenosis.   IMPRESSION: Minor loss of height at the superior endplate of L3. Underlying marrow edema may reflect recent compression fracture or may be on a degenerative basis.   Multilevel degenerative changes as detailed above. Disc herniations at at L1-L2 and L2-L3. Compression of the right L3 nerve root at L2-L3. Probable compression of right L2 nerve root at L2-L3. Possible compression of right L2 nerve root at L1-L2.         TODAY'S TREATMENT     PATIENT SURVEYS:  FOTO 60 with a goal of 52 at visit #13.  No change in FOTO   LOWER EXTREMITY MMT:     MMT Right 01/07/22 Left eval  Hip flexion 5/5 5/5  Hip extension      Hip abduction Tested in sitting, Improved strength, 20.8 Tested in sitting, WFL, 21.1  Hip adduction  Hip internal rotation      Hip external rotation      Knee flexion 5/5 5/5  Knee extension 5/5 5/5  Ankle dorsiflexion 5/5 5/5  Ankle plantarflexion WFL tested in sitting WFL tested in sitting  Ankle inversion      Ankle eversion       (Blank  rows = not tested)   GAIT: Assistive device utilized: None Level of assistance: Complete Independence Comments: Pt ambulates with a normalized heel to toe gait without limping  THERAPEUTIC EXERCISE: -Reviewed response to prior Rx, pain levels, reported functional progress and deficits, and HEP compliance. -Pt performed:  Nustep at L6 x 6 mins, UE's/LE's Supine bridge SL in figure 4 position 2x10 reps S/L clams with GTB 2x10 bilat Standing D2 with TrA with RTB 2x10 bilat Qped birddog 2x10 reps  Lateral band walks with GTB above knees 3x10 at rail without UE support.     Prone planks on knees 3x15 sec    -PT reviewed HEP and updated HEP.  Pt received an updated HEP handout and was educated in correct form and appropriate frequency.     PATIENT EDUCATION:  Education details:  squatting form, POC, and exercise form and purpose.  PT answered Pt's questions.   Person educated: Patient Education method: Explanation, Demonstration, Tactile cues, Verbal cues Education comprehension: verbalized understanding, returned demonstration, verbal cues required, tactile cues required, and needs further education     HOME EXERCISE PROGRAM: Access Code: [email or web address] URL: [email or web address] Date: 10/14/2021 Prepared by: Trey Harrison Updated HEP: - Clamshell with Resistance  - 1 x daily - 4 x weekly - 2 sets - 10 reps - Figure 4 Bridge  - 1 x daily - 5 x weekly - 2 sets - 10 reps - Bird Dog  - 1 x daily - 4-5 x weekly - 2 sets - 10 reps - Plank on Knees  - 1 x daily - 4-5 x weekly - 3 reps - 15 seconds hold - Side Stepping with Resistance at Thighs  - 1 x daily - 3 x weekly - 3 sets - 10 reps - Single Leg Stance  - 1 x daily - 5-6 x weekly - 3 reps - 20 second hold - Standing Shoulder Single Arm PNF D2 Flexion with Resistance  - 1 x daily - 3-4 x weekly - 2 sets - 10 reps    ASSESSMENT:   CLINICAL IMPRESSION: Pt has made excellent progress in PT.  She has improved performance  of work activities and is able to open/close the galley service door without significant pain.  Pt is able to perform her normal functional lifting and carrying without significant pain or difficulty.  Pt does have some pain after performing gardening activities.  She has improved with core strength as evidenced by performance and progression of core exercises.   Pt demonstrates improved R LE strength having 5/5 strength in hip flex, knee ext, and knee flexion.  She has improved with R hip abduction strength and is only 3/10 weaker compared to L LE with HHD testing.  PT updated HEP today and gave pt a HEP handout.  Pt demonstrates good understanding and is independent with HEP.  Pt has met all goals except 3/10 of a difference between strength in hip abduction.  Pt has met her FOTO goal.  Pt is ready for discharge.      OBJECTIVE IMPAIRMENTS decreased activity tolerance, decreased endurance, decreased mobility, decreased strength, improper body mechanics, and pain.    ACTIVITY   LIMITATIONS carrying, lifting, bending, sitting, standing, squatting, and reach over head   PARTICIPATION LIMITATIONS: meal prep, cleaning, laundry, driving, community activity, and occupation   PERSONAL FACTORS 1 comorbidity: depression  are also affecting patient's functional outcome.    REHAB POTENTIAL: Good   CLINICAL DECISION MAKING: Stable/uncomplicated   EVALUATION COMPLEXITY: Low     GOALS:   SHORT TERM GOALS:    Pt will be independent and compliant with HEP for improved pain, strength, mobility, and function.  Baseline: Goal status: GOAL MET Target date: 10/23/2021   2.  Pt will demo improved core strength as evidenced by progression of core exercises without adverse effects for improved tolerance to daily activities and mobility.  Baseline:  Goal status: GOAL MET Target date: 10/30/2021   3.  Pt will demo good control with core exercises for improved neuromuscular control and core strength to perform  work activities and functional lifting.  Baseline:  Goal status: GOAL MET Target date:  11/13/2021     4.  Pt will cont with walking program and report less pain with walking program.  Baseline:  Goal status: GOAL MET Target date:  11/13/2021       LONG TERM GOALS: Target date: 12/30/2021    Pt will demonstrate good form and body mechanics with squat to lift in order to perform functional lifting safely with reduced stress to lumbar.   Baseline:  Goal status: GOAL MET   2.  Pt will demo R LE strength to = L LE strength for performance of functional mobility and to assist with performing occupational activities.  Baseline:  Goal status: 95% MET   9/26   3.  Pt will return to work without adverse effects.  Baseline:  Goal status: GOAL MET  9/26   4.  Pt will be able to perform her functional lifting and carrying as allowed by MD without significant pain and difficulty.  Baseline:  Goal status: GOAL MET  9/26         PLAN: PT FREQUENCY: 1 visit   PT DURATION:  1 visit   PLANNED INTERVENTIONS: Therapeutic exercises, Therapeutic activity, Neuromuscular re-education, Balance training, Gait training, Patient/Family education, Joint mobilization, Stair training, Aquatic Therapy, Dry Needling, Electrical stimulation, Spinal mobilization, Cryotherapy, Moist heat, Taping, Ultrasound, Manual therapy, and Re-evaluation.   PLAN FOR NEXT SESSION:   Pt to be discharged from skilled PT services due to meeting all goals except 3/10 difference in hip abduction strength.  Pt is agreeable with discharge.  She will cont with HEP.   PHYSICAL THERAPY DISCHARGE SUMMARY  Visits from Start of Care: 17  Current functional level related to goals / functional outcomes: See above   Remaining deficits: See above   Education / Equipment: Pt has a HEP.      Selinda Michaels III PT, DPT 01/08/22 9:32 PM

## 2022-01-08 ENCOUNTER — Inpatient Hospital Stay: Payer: Commercial Managed Care - PPO | Admitting: Physician Assistant

## 2022-01-08 ENCOUNTER — Inpatient Hospital Stay: Payer: Commercial Managed Care - PPO

## 2022-01-14 ENCOUNTER — Other Ambulatory Visit: Payer: Self-pay | Admitting: Physician Assistant

## 2022-01-14 DIAGNOSIS — D751 Secondary polycythemia: Secondary | ICD-10-CM

## 2022-01-15 ENCOUNTER — Inpatient Hospital Stay: Payer: Commercial Managed Care - PPO | Admitting: Physician Assistant

## 2022-01-15 ENCOUNTER — Inpatient Hospital Stay: Payer: Commercial Managed Care - PPO | Attending: Nurse Practitioner

## 2022-01-21 ENCOUNTER — Ambulatory Visit: Payer: Commercial Managed Care - PPO | Admitting: Nurse Practitioner

## 2022-02-15 ENCOUNTER — Other Ambulatory Visit: Payer: Self-pay | Admitting: Nurse Practitioner

## 2022-02-15 DIAGNOSIS — I10 Essential (primary) hypertension: Secondary | ICD-10-CM

## 2022-02-23 ENCOUNTER — Other Ambulatory Visit: Payer: Self-pay | Admitting: Nurse Practitioner

## 2022-02-23 DIAGNOSIS — I10 Essential (primary) hypertension: Secondary | ICD-10-CM

## 2022-02-26 ENCOUNTER — Telehealth: Payer: Self-pay

## 2022-02-26 ENCOUNTER — Other Ambulatory Visit: Payer: Self-pay

## 2022-02-26 DIAGNOSIS — I10 Essential (primary) hypertension: Secondary | ICD-10-CM

## 2022-02-26 MED ORDER — ATENOLOL 50 MG PO TABS: 50.0000 mg | ORAL_TABLET | Freq: Every day | ORAL | 0 refills | Status: DC

## 2022-02-26 NOTE — Telephone Encounter (Signed)
Patient states she has been trying to get refill for atenolol. Patient kindly reminded that she has not been seen since July, when on bp medication she has to be followed up with every 3-4 months. 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 Patient also adds she is leaving out of town tomorrow not knowing when she will be back being out of work for 7 months. Atenolol sent, patient will give a call on Monday, Nov 20th to make a virtual appointment. Given ext [phone].

## 2022-03-20 ENCOUNTER — Telehealth (INDEPENDENT_AMBULATORY_CARE_PROVIDER_SITE_OTHER): Payer: Commercial Managed Care - PPO | Admitting: Nurse Practitioner

## 2022-03-20 ENCOUNTER — Encounter: Payer: Self-pay | Admitting: Nurse Practitioner

## 2022-03-20 DIAGNOSIS — I10 Essential (primary) hypertension: Secondary | ICD-10-CM

## 2022-03-20 DIAGNOSIS — N289 Disorder of kidney and ureter, unspecified: Secondary | ICD-10-CM | POA: Diagnosis not present

## 2022-03-20 DIAGNOSIS — E7800 Pure hypercholesterolemia, unspecified: Secondary | ICD-10-CM

## 2022-03-20 DIAGNOSIS — F331 Major depressive disorder, recurrent, moderate: Secondary | ICD-10-CM

## 2022-03-20 MED ORDER — BUPROPION HCL ER (XL) 300 MG PO TB24: 300.0000 mg | ORAL_TABLET | ORAL | 1 refills | Status: DC

## 2022-03-20 MED ORDER — HYDROCHLOROTHIAZIDE 12.5 MG PO TABS: 12.5000 mg | ORAL_TABLET | Freq: Every day | ORAL | 1 refills | Status: DC

## 2022-03-20 MED ORDER — ATENOLOL 50 MG PO TABS: 50.0000 mg | ORAL_TABLET | Freq: Every day | ORAL | 1 refills | Status: DC

## 2022-03-20 NOTE — Patient Instructions (Signed)
Hypertension, Adult High blood pressure (hypertension) is when the force of blood pumping through the arteries is too strong. The arteries are the blood vessels that carry blood from the heart throughout the body. Hypertension forces the heart to work harder to pump blood and may cause arteries to become narrow or stiff. Untreated or uncontrolled hypertension can lead to a heart attack, heart failure, a stroke, kidney disease, and other problems. A blood pressure reading consists of a higher number over a lower number. Ideally, your blood pressure should be below 120/80. The first ("top") number is called the systolic pressure. It is a measure of the pressure in your arteries as your heart beats. The second ("bottom") number is called the diastolic pressure. It is a measure of the pressure in your arteries as the heart relaxes. What are the causes? The exact cause of this condition is not known. There are some conditions that result in high blood pressure. What increases the risk? Certain factors may make you more likely to develop high blood pressure. Some of these risk factors are under your control, including: Smoking. Not getting enough exercise or physical activity. Being overweight. Having too much fat, sugar, calories, or salt (sodium) in your diet. Drinking too much alcohol. Other risk factors include: Having a personal history of heart disease, diabetes, high cholesterol, or kidney disease. Stress. Having a family history of high blood pressure and high cholesterol. Having obstructive sleep apnea. Age. The risk increases with age. What are the signs or symptoms? High blood pressure may not cause symptoms. Very high blood pressure (hypertensive crisis) may cause: Headache. Fast or irregular heartbeats (palpitations). Shortness of breath. Nosebleed. Nausea and vomiting. Vision changes. Severe chest pain, dizziness, and seizures. How is this diagnosed? This condition is diagnosed by  measuring your blood pressure while you are seated, with your arm resting on a flat surface, your legs uncrossed, and your feet flat on the floor. The cuff of the blood pressure monitor will be placed directly against the skin of your upper arm at the level of your heart. Blood pressure should be measured at least twice using the same arm. Certain conditions can cause a difference in blood pressure between your right and left arms. If you have a high blood pressure reading during one visit or you have normal blood pressure with other risk factors, you may be asked to: Return on a different day to have your blood pressure checked again. Monitor your blood pressure at home for 1 week or longer. If you are diagnosed with hypertension, you may have other blood or imaging tests to help your health care provider understand your overall risk for other conditions. How is this treated? This condition is treated by making healthy lifestyle changes, such as eating healthy foods, exercising more, and reducing your alcohol intake. You may be referred for counseling on a healthy diet and physical activity. Your health care provider may prescribe medicine if lifestyle changes are not enough to get your blood pressure under control and if: Your systolic blood pressure is above 130. Your diastolic blood pressure is above 80. Your personal target blood pressure may vary depending on your medical conditions, your age, and other factors. Follow these instructions at home: Eating and drinking  Eat a diet that is high in fiber and potassium, and low in sodium, added sugar, and fat. An example of this eating plan is called the DASH diet. DASH stands for Dietary Approaches to Stop Hypertension. To eat this way: Eat   plenty of fresh fruits and vegetables. Try to fill one half of your plate at each meal with fruits and vegetables. Eat whole grains, such as whole-wheat pasta, brown rice, or whole-grain bread. Fill about one  fourth of your plate with whole grains. Eat or drink low-fat dairy products, such as skim milk or low-fat yogurt. Avoid fatty cuts of meat, processed or cured meats, and poultry with skin. Fill about one fourth of your plate with lean proteins, such as fish, chicken without skin, beans, eggs, or tofu. Avoid pre-made and processed foods. These tend to be higher in sodium, added sugar, and fat. Reduce your daily sodium intake. Many people with hypertension should eat less than 1,500 mg of sodium a day. Do not drink alcohol if: Your health care provider tells you not to drink. You are pregnant, may be pregnant, or are planning to become pregnant. If you drink alcohol: Limit how much you have to: 0-1 drink a day for women. 0-2 drinks a day for men. Know how much alcohol is in your drink. In the [place] one drink equals one 12 oz bottle of beer (355 mL), one 5 oz glass of wine (148 mL), or one 1 oz glass of hard liquor (44 mL). Lifestyle  Work with your health care provider to maintain a healthy body weight or to lose weight. Ask what an ideal weight is for you. Get at least 30 minutes of exercise that causes your heart to beat faster (aerobic exercise) most days of the week. Activities may include walking, swimming, or biking. Include exercise to strengthen your muscles (resistance exercise), such as Pilates or lifting weights, as part of your weekly exercise routine. Try to do these types of exercises for 30 minutes at least 3 days a week. Do not use any products that contain nicotine or tobacco. These products include cigarettes, chewing tobacco, and vaping devices, such as e-cigarettes. If you need help quitting, ask your health care provider. Monitor your blood pressure at home as told by your health care provider. Keep all follow-up visits. This is important. Medicines Take over-the-counter and prescription medicines only as told by your health care provider. Follow directions carefully. Blood  pressure medicines must be taken as prescribed. Do not skip doses of blood pressure medicine. Doing this puts you at risk for problems and can make the medicine less effective. Ask your health care provider about side effects or reactions to medicines that you should watch for. Contact a health care provider if you: Think you are having a reaction to a medicine you are taking. Have headaches that keep coming back (recurring). Feel dizzy. Have swelling in your ankles. Have trouble with your vision. Get help right away if you: Develop a severe headache or confusion. Have unusual weakness or numbness. Feel faint. Have severe pain in your chest or abdomen. Vomit repeatedly. Have trouble breathing. These symptoms may be an emergency. Get help right away. Call 911. Do not wait to see if the symptoms will go away. Do not drive yourself to the hospital. Summary Hypertension is when the force of blood pumping through your arteries is too strong. If this condition is not controlled, it may put you at risk for serious complications. Your personal target blood pressure may vary depending on your medical conditions, your age, and other factors. For most people, a normal blood pressure is less than 120/80. Hypertension is treated with lifestyle changes, medicines, or a combination of both. Lifestyle changes include losing weight, eating a healthy,   low-sodium diet, exercising more, and limiting alcohol. This information is not intended to replace advice given to you by your health care provider. Make sure you discuss any questions you have with your health care provider. Document Revised: 02/05/2021 Document Reviewed: 02/05/2021 Elsevier Patient Education  2023 [employer]  

## 2022-03-20 NOTE — Progress Notes (Signed)
Virtual Visit via MyChart   This visit type was conducted due to national recommendations for restrictions regarding the COVID-19 Pandemic (e.g. social distancing) in an effort to limit this patient's exposure and mitigate transmission in our community.  Due to her co-morbid illnesses, this patient is at least at moderate risk for complications without adequate follow up.  This format is felt to be most appropriate for this patient at this time.  All issues noted in this document were discussed and addressed.  A limited physical exam was performed with this format.    This visit type was conducted due to national recommendations for restrictions regarding the COVID-19 Pandemic (e.g. social distancing) in an effort to limit this patient's exposure and mitigate transmission in our community.  Patients identity confirmed using two different identifiers.  This format is felt to be most appropriate for this patient at this time.  All issues noted in this document were discussed and addressed.  No physical exam was performed (except for noted visual exam findings with Video Visits).    Date:  03/20/2022   ID:  Renee Li, Renee Li April 14, 1971, MRN [id number]  [place] spoke with Luz Lex  Provider location:   Office    Chief Complaint:  blood pressure f/u  History of Present Illness:    Renee Li is a 51 y.o. female who presents via video conferencing for a telehealth visit today.    The patient does not have symptoms concerning for COVID-19 infection (fever, chills, cough, or new shortness of breath).   Pt here for BPC. She is was not able to check her blood pressure today due to flying. Blood pressure averages 130/70's. She is now wearing a fitbit. She will be back in town this evening. She will be back in town on December 19th.       Hypertension This is a chronic problem. The current episode started more than 1 year ago. The problem is  controlled. Pertinent negatives include no anxiety. There are no associated agents to hypertension. Risk factors for coronary artery disease include sedentary lifestyle and obesity. Past treatments include diuretics. There are no compliance problems.  There is no history of angina. There is no history of chronic renal disease.     Past Medical History:  Diagnosis Date   Anemia    Depression    Heart murmur    never caused any problems   Hypertension    Migraines    Past Surgical History:  Procedure Laterality Date   APPENDECTOMY     WISDOM TOOTH EXTRACTION       No outpatient medications have been marked as taking for the 03/20/22 encounter (Video Visit) with [name], [place].     Allergies:   Aleve [naproxen] and Ibuprofen   Social History   Tobacco Use   Smoking status: Never   Smokeless tobacco: Never  Vaping Use   Vaping Use: Never used  Substance Use Topics   Alcohol use: Yes    Alcohol/week: 1.0 standard drink of alcohol    Types: 1 Glasses of wine per week   Drug use: No     Family Hx: The patient's family history includes Cancer in her maternal grandfather and mother; Diabetes in her maternal grandmother; Heart disease in her paternal grandmother; Stroke in her maternal grandmother. There is no history of Colon cancer, Rectal cancer, Stomach cancer, Esophageal cancer, or Breast cancer.  ROS:   Please see the history of present illness.  Review of Systems  Constitutional: Negative.   HENT: Negative.    Respiratory: Negative.    Genitourinary: Negative.   Neurological: Negative.   Endo/Heme/Allergies: Negative.   Psychiatric/Behavioral: Negative.      All other systems reviewed and are negative.   Labs/Other Tests and Data Reviewed:    Recent Labs: 06/28/2021: ALT 16; Hemoglobin 15.4; Platelet Count 248 10/22/2021: BUN 17; Creatinine, Ser 1.11; Potassium 4.0; Sodium 144   Recent Lipid Panel Lab Results  Component Value Date/Time   CHOL 222 (H)  04/17/2021 10:57 AM   TRIG 142 04/17/2021 10:57 AM   HDL 53 04/17/2021 10:57 AM   CHOLHDL 4.2 04/17/2021 10:57 AM   LDLCALC 144 (H) 04/17/2021 10:57 AM    Wt Readings from Last 3 Encounters:  10/22/21 178 lb 12.8 oz (81.1 kg)  06/28/21 173 lb 6.4 oz (78.7 kg)  06/25/21 171 lb (77.6 kg)     Exam:    Vital Signs:  There were no vitals taken for this visit.    Physical Exam Vitals reviewed.  Constitutional:      General: She is not in acute distress.    Appearance: Normal appearance.  Pulmonary:     Effort: Pulmonary effort is normal. No respiratory distress.  Neurological:     General: No focal deficit present.     Mental Status: She is alert and oriented to person, place, and time. Mental status is at baseline.     Cranial Nerves: No cranial nerve deficit.  Psychiatric:        Mood and Affect: Mood and affect normal.        Behavior: Behavior normal.        Thought Content: Thought content normal.        Cognition and Memory: Memory normal.        Judgment: Judgment normal.     ASSESSMENT & PLAN:    1. Essential hypertension Unable to check blood pressure today currently not at home. Will refill medications and she is to come for lab visit on Dec 19th when she will be back in town.  - BMP8+eGFR; Future - atenolol (TENORMIN) 50 MG tablet; Take 1 tablet (50 mg total) by mouth daily.  Dispense: 90 tablet; Refill: 1 - buPROPion (WELLBUTRIN XL) 300 MG 24 hr tablet; Take 1 tablet (300 mg total) by mouth every morning.  Dispense: 90 tablet; Refill: 1 - hydrochlorothiazide (HYDRODIURIL) 12.5 MG tablet; Take 1 tablet (12.5 mg total) by mouth daily.  Dispense: 90 tablet; Refill: 1  2. Abnormal kidney function She did not return for her repeat BMP, will check when she comes later this month - BMP8+eGFR; Future  3. Moderate episode of recurrent major depressive disorder (HCC) Continue buproprion, tolerating well  4. Elevated cholesterol No current medications encouraged low  fat diet and will fast for next labs.  - BMP8+eGFR; Future - Lipid panel   COVID-19 Education: The signs and symptoms of COVID-19 were discussed with the patient and how to seek care for testing (follow up with PCP or arrange E-visit).  The importance of social distancing was discussed today.  Patient Risk:   After full review of this patients clinical status, I feel that they are at least moderate risk at this time.  Time:   Today, I have spent 8.5 minutes/ seconds with the patient with telehealth technology discussing above diagnoses.     Medication Adjustments/Labs and Tests Ordered: Current medicines are reviewed at length with the patient today.  Concerns regarding medicines  are outlined above.   Tests Ordered: Orders Placed This Encounter  Procedures   BMP8+eGFR   Lipid panel    Medication Changes: Meds ordered this encounter  Medications   atenolol (TENORMIN) 50 MG tablet    Sig: Take 1 tablet (50 mg total) by mouth daily.    Dispense:  90 tablet    Refill:  1   buPROPion (WELLBUTRIN XL) 300 MG 24 hr tablet    Sig: Take 1 tablet (300 mg total) by mouth every morning.    Dispense:  90 tablet    Refill:  1   hydrochlorothiazide (HYDRODIURIL) 12.5 MG tablet    Sig: Take 1 tablet (12.5 mg total) by mouth daily.    Dispense:  90 tablet    Refill:  1    Disposition:  Follow up 4 months and Dec 19th for labs only - BMP and Lipid - order in EPIC  Signed, [name], FNP

## 2022-04-01 ENCOUNTER — Other Ambulatory Visit: Payer: Commercial Managed Care - PPO

## 2022-04-01 DIAGNOSIS — N289 Disorder of kidney and ureter, unspecified: Secondary | ICD-10-CM

## 2022-04-01 DIAGNOSIS — E7800 Pure hypercholesterolemia, unspecified: Secondary | ICD-10-CM

## 2022-04-01 DIAGNOSIS — I10 Essential (primary) hypertension: Secondary | ICD-10-CM

## 2022-04-01 LAB — BMP8+EGFR
BUN/Creatinine Ratio: 15 (ref 9–23)
BUN: 16 mg/dL (ref 6–24)
CO2: 25 mmol/L (ref 20–29)
Calcium: 9.3 mg/dL (ref 8.7–10.2)
Chloride: 102 mmol/L (ref 96–106)
Creatinine, Ser: 1.06 mg/dL — ABNORMAL HIGH (ref 0.57–1.00)
Glucose: 94 mg/dL (ref 70–99)
Potassium: 3.9 mmol/L (ref 3.5–5.2)
Sodium: 142 mmol/L (ref 134–144)
eGFR: 63 mL/min/{1.73_m2} (ref >59)

## 2022-04-13 ENCOUNTER — Encounter: Payer: Self-pay | Admitting: Nurse Practitioner

## 2022-06-24 ENCOUNTER — Encounter: Payer: Self-pay | Admitting: Nurse Practitioner

## 2022-07-01 ENCOUNTER — Encounter: Payer: Self-pay | Admitting: Nurse Practitioner

## 2022-07-01 ENCOUNTER — Ambulatory Visit (INDEPENDENT_AMBULATORY_CARE_PROVIDER_SITE_OTHER): Payer: Commercial Managed Care - PPO | Admitting: Nurse Practitioner

## 2022-07-01 VITALS — BP 120/78 | HR 66 | Temp 98.7°F | Ht 63.0 in | Wt 176.0 lb

## 2022-07-01 DIAGNOSIS — F3342 Major depressive disorder, recurrent, in full remission: Secondary | ICD-10-CM | POA: Diagnosis not present

## 2022-07-01 DIAGNOSIS — E7800 Pure hypercholesterolemia, unspecified: Secondary | ICD-10-CM | POA: Insufficient documentation

## 2022-07-01 DIAGNOSIS — Z9889 Other specified postprocedural states: Secondary | ICD-10-CM | POA: Insufficient documentation

## 2022-07-01 NOTE — Progress Notes (Signed)
I,Sheena H Holbrook,acting as a Education administrator for [name], FNP.,have documented all relevant documentation on the behalf of [name], FNP,as directed by  [name], FNP while in the presence of [name], [place].    Subjective:     Patient ID: Renee Li , female    DOB: 1971-01-06 , 52 y.o.   MRN: [id number]   Chief Complaint  Patient presents with   Medication Management    HPI  Patient presents today to discuss medication management. Patient would like to stop her medication, she has not taken the Wellbutrin since February. She would like to go to [employer]. She has seen the by a provider for the [facility].   She was on wellbutrin after her mother, cancer, and stress. She does not feel like she is as stress. She now has an AirBNB in her house. She is willing to go to a psychiatrist to make sure she is doing well.   She has stopped taking the wellbutrin in February and denies any problems. She is requesting a letter indicating her history with of her use with wellbutrin.      Past Medical History:  Diagnosis Date   Anemia    Depression    Heart murmur    never caused any problems   Hypertension    Migraines      Family History  Problem Relation Age of Onset   Cancer Mother        throat   Stroke Maternal Grandmother    Diabetes Maternal Grandmother    Cancer Maternal Grandfather        prostate   Heart disease Paternal Grandmother    Colon cancer Neg Hx    Rectal cancer Neg Hx    Stomach cancer Neg Hx    Esophageal cancer Neg Hx    Breast cancer Neg Hx      Current Outpatient Medications:    aspirin-acetaminophen-caffeine (EXCEDRIN MIGRAINE) 250-250-65 MG per tablet, Take 1 tablet by mouth every 6 (six) hours as needed for headache., Disp: , Rfl:    atenolol (TENORMIN) 50 MG tablet, Take 1 tablet (50 mg total) by mouth daily., Disp: 90 tablet, Rfl: 1   hydrochlorothiazide (HYDRODIURIL) 12.5 MG tablet, Take 1 tablet (12.5 mg total) by mouth daily.,  Disp: 90 tablet, Rfl: 1   buPROPion (WELLBUTRIN XL) 300 MG 24 hr tablet, Take 1 tablet (300 mg total) by mouth every morning. (Patient not taking: Reported on 07/01/2022), Disp: 90 tablet, Rfl: 1   fluticasone (FLONASE) 50 MCG/ACT nasal spray, Place 2 sprays into both nostrils daily., Disp: 18.2 mL, Rfl: 2   Allergies  Allergen Reactions   Aleve [Naproxen] Hives   Ibuprofen Hives, Itching and Rash     Review of Systems  Constitutional: Negative.   Respiratory: Negative.    Cardiovascular: Negative.   Neurological: Negative.   Psychiatric/Behavioral: Negative.       Today's Vitals   07/01/22 1053  BP: 120/78  Pulse: 66  Temp: 98.7 F (37.1 C)  TempSrc: Oral  SpO2: 98%  Weight: 176 lb (79.8 kg)  Height: 5\' 3"  (1.6 m)   Body mass index is 31.18 kg/m.   Objective:  Physical Exam Vitals reviewed.  Constitutional:      General: She is not in acute distress.    Appearance: Normal appearance.  Cardiovascular:     Rate and Rhythm: Normal rate and regular rhythm.     Pulses: Normal pulses.     Heart sounds: Normal heart  sounds. No murmur heard. Pulmonary:     Effort: Pulmonary effort is normal. No respiratory distress.     Breath sounds: Normal breath sounds. No wheezing.  Neurological:     General: No focal deficit present.     Mental Status: She is alert and oriented to person, place, and time.     Cranial Nerves: No cranial nerve deficit.     Motor: No weakness.  Psychiatric:        Attention and Perception: Attention and perception normal.        Mood and Affect: Mood normal.        Speech: Speech normal.        Behavior: Behavior normal.        Thought Content: Thought content normal.        Cognition and Memory: Cognition and memory normal.        Judgment: Judgment normal.         Assessment And Plan:     1. Recurrent major depressive disorder, in full remission ([place]) Comments: Current Depression screen score is 2, not currently taking wellbutrin stopped  in February. She is to f/u in 90 days and we will evaluate to see if doing well - BMP8+eGFR  2. Elevated cholesterol Comments: Cholesterol levels are elevated, encouraged to eat a low fat diet. - Lipid panel     Patient was given opportunity to ask questions. Patient verbalized understanding of the plan and was able to repeat key elements of the plan. All questions were answered to their satisfaction.  [name], FNP    I, [name], FNP, have reviewed all documentation for this visit. The documentation on 07/01/22 for the exam, diagnosis, procedures, and orders are all accurate and complete.  IF YOU HAVE BEEN REFERRED TO A SPECIALIST, IT MAY TAKE 1-2 WEEKS TO SCHEDULE/PROCESS THE REFERRAL. IF YOU HAVE NOT HEARD FROM US/SPECIALIST IN TWO WEEKS, PLEASE GIVE [place] A CALL AT [phone].   THE PATIENT IS ENCOURAGED TO PRACTICE SOCIAL DISTANCING DUE TO THE COVID-19 PANDEMIC.

## 2022-07-01 NOTE — Patient Instructions (Signed)
Dr. Charlesetta Garibaldi - OB/GYN - Phone: [phone]

## 2022-07-02 LAB — BMP8+EGFR
BUN/Creatinine Ratio: 15 (ref 9–23)
BUN: 15 mg/dL (ref 6–24)
CO2: 26 mmol/L (ref 20–29)
Calcium: 9.7 mg/dL (ref 8.7–10.2)
Chloride: 104 mmol/L (ref 96–106)
Creatinine, Ser: 0.97 mg/dL (ref 0.57–1.00)
Glucose: 88 mg/dL (ref 70–99)
Potassium: 5 mmol/L (ref 3.5–5.2)
Sodium: 143 mmol/L (ref 134–144)
eGFR: 69 mL/min/{1.73_m2} (ref >59)

## 2022-07-02 LAB — LIPID PANEL
Chol/HDL Ratio: 3.7 ratio (ref 0.0–4.4)
Cholesterol, Total: 200 mg/dL — ABNORMAL HIGH (ref 100–199)
HDL: 54 mg/dL (ref >39)
LDL Chol Calc (NIH): 126 mg/dL — ABNORMAL HIGH (ref 0–99)
Triglycerides: 113 mg/dL (ref 0–149)
VLDL Cholesterol Cal: 20 mg/dL (ref 5–40)

## 2022-08-19 ENCOUNTER — Encounter: Payer: Self-pay | Admitting: Nurse Practitioner

## 2022-09-30 ENCOUNTER — Encounter: Payer: Self-pay | Admitting: Nurse Practitioner

## 2022-09-30 ENCOUNTER — Ambulatory Visit (INDEPENDENT_AMBULATORY_CARE_PROVIDER_SITE_OTHER): Payer: Commercial Managed Care - PPO | Admitting: Nurse Practitioner

## 2022-09-30 VITALS — BP 130/80 | HR 81 | Temp 98.4°F | Ht 63.0 in | Wt 180.8 lb

## 2022-09-30 DIAGNOSIS — I10 Essential (primary) hypertension: Secondary | ICD-10-CM | POA: Diagnosis not present

## 2022-09-30 DIAGNOSIS — Z23 Encounter for immunization: Secondary | ICD-10-CM | POA: Diagnosis not present

## 2022-09-30 DIAGNOSIS — R430 Anosmia: Secondary | ICD-10-CM

## 2022-09-30 DIAGNOSIS — N390 Urinary tract infection, site not specified: Secondary | ICD-10-CM

## 2022-09-30 DIAGNOSIS — Z79899 Other long term (current) drug therapy: Secondary | ICD-10-CM

## 2022-09-30 DIAGNOSIS — E7800 Pure hypercholesterolemia, unspecified: Secondary | ICD-10-CM

## 2022-09-30 DIAGNOSIS — Z8679 Personal history of other diseases of the circulatory system: Secondary | ICD-10-CM

## 2022-09-30 DIAGNOSIS — F3342 Major depressive disorder, recurrent, in full remission: Secondary | ICD-10-CM | POA: Diagnosis not present

## 2022-09-30 DIAGNOSIS — Z0000 Encounter for general adult medical examination without abnormal findings: Secondary | ICD-10-CM | POA: Diagnosis not present

## 2022-09-30 LAB — POCT URINALYSIS DIP (CLINITEK)
Bilirubin, UA: NEGATIVE
Glucose, UA: NEGATIVE mg/dL
Ketones, POC UA: NEGATIVE mg/dL
Nitrite, UA: POSITIVE — AB
Spec Grav, UA: >=1.03 — AB (ref 1.010–1.025)
Urobilinogen, UA: 0.2 E.U./dL
pH, UA: 6 (ref 5.0–8.0)

## 2022-09-30 MED ORDER — NITROFURANTOIN MONOHYD MACRO 100 MG PO CAPS
100.0000 mg | ORAL_CAPSULE | Freq: Two times a day (BID) | ORAL | 0 refills | Status: AC
Start: 2022-09-30 — End: 2022-10-05

## 2022-09-30 NOTE — Progress Notes (Signed)
I,Kaedyn Polivka,acting as a Neurosurgeon for Arnette Felts, FNP.,have documented all relevant documentation on the behalf of Arnette Felts, FNP,as directed by  Arnette Felts, FNP while in the presence of Arnette Felts, FNP.  Subjective:    Patient ID: Renee Li , female    DOB: February 05, 1971 , 52 y.o.   MRN: [id number]  Chief Complaint  Patient presents with   Annual Exam    HPI  Patient presents today for HM, patient reports compliance with medications and has no other concerns today. Patient denies any chest pain, SOB, or headaches.   Patient is established with GYN.      Past Medical History:  Diagnosis Date   Allergy    Anemia    Concussion 07/10/2016   Concussion with loss of consciousness 07/15/2016   Depression    Heart murmur    never caused any problems   Hypertension    Low back pain 06/14/2021   Migraines    Neck fullness 06/30/2021     Family History  Problem Relation Age of Onset   Cancer Mother        throat   Depression Mother    Stroke Maternal Grandmother    Diabetes Maternal Grandmother    Cancer Maternal Grandfather        prostate   Heart disease Paternal Grandmother    Colon cancer Neg Hx    Rectal cancer Neg Hx    Stomach cancer Neg Hx    Esophageal cancer Neg Hx    Breast cancer Neg Hx      Current Outpatient Medications:    atenolol (TENORMIN) 50 MG tablet, Take 1 tablet (50 mg total) by mouth daily., Disp: 90 tablet, Rfl: 1   hydrochlorothiazide (HYDRODIURIL) 12.5 MG tablet, Take 1 tablet (12.5 mg total) by mouth daily., Disp: 90 tablet, Rfl: 1   Allergies  Allergen Reactions   Aleve [Naproxen] Hives   Ibuprofen Hives, Itching and Rash      The patient states she uses IUD for birth control. No LMP recorded (lmp unknown). (Menstrual status: IUD).. Negative for Dysmenorrhea and Negative for Menorrhagia. Negative for: breast discharge, breast lump(s), breast pain and breast self exam. Associated symptoms include abnormal vaginal  bleeding. Pertinent negatives include abnormal bleeding (hematology), anxiety, decreased libido, depression, difficulty falling sleep, dyspareunia, history of infertility, nocturia, sexual dysfunction, sleep disturbances, urinary incontinence, urinary urgency, vaginal discharge and vaginal itching. Diet regular. She has cut out coffee and now drinking teas. She is planning to do intermittent fasting. The patient states her exercise level is moderate - dance 3 times a week ([place]) and walking.   The patient's tobacco use is:  Social History   Tobacco Use  Smoking Status Never  Smokeless Tobacco Never   She has been exposed to passive smoke. The patient's alcohol use is:  Social History   Substance and Sexual Activity  Alcohol Use Yes   Alcohol/week: 1.0 standard drink of alcohol   Types: 1 Glasses of wine per week   Additional information: Last pap 2021, next one scheduled for not scheduled at this time.    Review of Systems  Constitutional: Negative.   HENT: Negative.    Eyes: Negative.   Respiratory: Negative.    Cardiovascular: Negative.   Gastrointestinal: Negative.   Endocrine: Negative.   Genitourinary: Negative.   Musculoskeletal: Negative.   Skin: Negative.   Allergic/Immunologic: Negative.   Neurological: Negative.   Hematological: Negative.   Psychiatric/Behavioral: Negative.       Today's  Vitals   09/30/22 1155 09/30/22 1250  BP: 134/80 130/80  Pulse: 81   Temp: 98.4 F (36.9 C)   TempSrc: Oral   Weight: 180 lb 12.8 oz (82 kg)   Height: 5\' 3"  (1.6 m)   PainSc: 0-No pain    Body mass index is 32.03 kg/m.  Wt Readings from Last 3 Encounters:  09/30/22 180 lb 12.8 oz (82 kg)  07/01/22 176 lb (79.8 kg)  10/22/21 178 lb 12.8 oz (81.1 kg)     Objective:  Physical Exam Vitals reviewed.  Constitutional:      General: She is not in acute distress.    Appearance: Normal appearance. She is well-developed. She is obese.  HENT:     Head: Normocephalic  and atraumatic.     Right Ear: Hearing, tympanic membrane, ear canal and external ear normal. There is no impacted cerumen.     Left Ear: Hearing, tympanic membrane, ear canal and external ear normal. There is no impacted cerumen.     Nose: Nose normal.     Mouth/Throat:     Mouth: Mucous membranes are moist.  Eyes:     General: Lids are normal.     Extraocular Movements: Extraocular movements intact.     Conjunctiva/sclera: Conjunctivae normal.     Pupils: Pupils are equal, round, and reactive to light.     Funduscopic exam:    Right eye: No papilledema.        Left eye: No papilledema.  Neck:     Thyroid: No thyroid mass.     Vascular: No carotid bruit.  Cardiovascular:     Rate and Rhythm: Normal rate and regular rhythm.     Pulses: Normal pulses.     Heart sounds: Normal heart sounds. No murmur heard. Pulmonary:     Effort: Pulmonary effort is normal. No respiratory distress.     Breath sounds: Normal breath sounds. No wheezing.  Abdominal:     General: Abdomen is flat. Bowel sounds are normal. There is no distension.     Palpations: Abdomen is soft. There is no mass.     Tenderness: There is no abdominal tenderness.  Genitourinary:    Comments: Deferred followed by GYN Musculoskeletal:        General: No swelling. Normal range of motion.     Cervical back: Full passive range of motion without pain, normal range of motion and neck supple.     Right lower leg: No edema.     Left lower leg: No edema.  Skin:    General: Skin is warm and dry.     Capillary Refill: Capillary refill takes less than 2 seconds.  Neurological:     General: No focal deficit present.     Mental Status: She is alert and oriented to person, place, and time.     Cranial Nerves: No cranial nerve deficit.     Sensory: No sensory deficit.     Motor: No weakness.  Psychiatric:        Mood and Affect: Mood normal.        Behavior: Behavior normal.        Thought Content: Thought content normal.         Judgment: Judgment normal.         Assessment And Plan:  Encounter for annual health examination Assessment & Plan: Behavior modifications discussed and diet history reviewed.   Pt will continue to exercise regularly and modify diet with low GI, plant based foods  and decrease intake of processed foods.  Recommend intake of daily multivitamin, Vitamin D, and calcium.  Recommend mammogram and colonoscopy for preventive screenings, as well as recommend immunizations that include influenza, TDAP, and Shingles    Essential hypertension Assessment & Plan: Blood pressure is fairly controlled.  Continue low-salt diet.  Encouraged to exercise at least 30 minutes/day 5 days a week. EKG done with Sinus Bradycardia  -Left atrial enlargement. With HR 57   Orders: -     POCT URINALYSIS DIP (CLINITEK) -     Microalbumin / creatinine urine ratio -     EKG 12-Lead -     CMP14+EGFR  Elevated cholesterol Assessment & Plan: Will check lipid panel, diet controlled, encouraged to eat low fat diet  Orders: -     Lipid panel  Recurrent major depressive disorder, in full remission [facility]) Assessment & Plan: Currently in full remission, no longer taking wellbutrin.    Anosmia Assessment & Plan: Continued challenges with smell, will refer to ENT   Orders: -     Ambulatory referral to ENT  Urinary tract infection without hematuria, site unspecified Assessment & Plan: Positive nitrates in urine we will treat with antibiotic and send urine for culture  Orders: -     Urine Culture -     Nitrofurantoin Monohyd Macro; Take 1 capsule (100 mg total) by mouth 2 (two) times daily for 5 days.  Dispense: 10 capsule; Refill: 0  Need for zoster vaccination Assessment & Plan: 2nd shingrix administered.   Orders: -     Varicella-zoster vaccine IM  History of cardiac murmur Assessment & Plan: Patient reports previous history however I do not hear a murmur due to wanting to go to [employer] will  have to see   Orders: -     Ambulatory referral to Cardiology  Other long term (current) drug therapy -     CBC with Differential/Platelet       Return for 1 year physical, 6 month bp check. Patient was given opportunity to ask questions. Patient verbalized understanding of the plan and was able to repeat key elements of the plan. All questions were answered to their satisfaction.   Arnette Felts, FNP   I, Arnette Felts, FNP, have reviewed all documentation for this visit. The documentation on 09/30/22 for the exam, diagnosis, procedures, and orders are all accurate and complete.

## 2022-09-30 NOTE — Progress Notes (Signed)
Madelaine Bhat, CMA,acting as a Neurosurgeon for Arnette Felts, FNP.,have documented all relevant documentation on the behalf of Arnette Felts, FNP,as directed by  Arnette Felts, FNP while in the presence of Arnette Felts, FNP.    Subjective:  Patient ID: Renee Li , female    DOB: Jan 05, 1971 , 52 y.o.   MRN: [id number]  No chief complaint on file.   HPI  Patient presents today for HM, patient reports compliance with medications and has no other concerns today. Patient denies any chest pain, SOB, or headaches.  Patient is established with GYN.       Past Medical History:  Diagnosis Date   Anemia    Depression    Heart murmur    never caused any problems   Hypertension    Migraines      Family History  Problem Relation Age of Onset   Cancer Mother        throat   Stroke Maternal Grandmother    Diabetes Maternal Grandmother    Cancer Maternal Grandfather        prostate   Heart disease Paternal Grandmother    Colon cancer Neg Hx    Rectal cancer Neg Hx    Stomach cancer Neg Hx    Esophageal cancer Neg Hx    Breast cancer Neg Hx      Current Outpatient Medications:    aspirin-acetaminophen-caffeine (EXCEDRIN MIGRAINE) 250-250-65 MG per tablet, Take 1 tablet by mouth every 6 (six) hours as needed for headache., Disp: , Rfl:    atenolol (TENORMIN) 50 MG tablet, Take 1 tablet (50 mg total) by mouth daily., Disp: 90 tablet, Rfl: 1   buPROPion (WELLBUTRIN XL) 300 MG 24 hr tablet, Take 1 tablet (300 mg total) by mouth every morning. (Patient not taking: Reported on 07/01/2022), Disp: 90 tablet, Rfl: 1   fluticasone (FLONASE) 50 MCG/ACT nasal spray, Place 2 sprays into both nostrils daily., Disp: 18.2 mL, Rfl: 2   hydrochlorothiazide (HYDRODIURIL) 12.5 MG tablet, Take 1 tablet (12.5 mg total) by mouth daily., Disp: 90 tablet, Rfl: 1   Allergies  Allergen Reactions   Aleve [Naproxen] Hives   Ibuprofen Hives, Itching and Rash     Review of Systems  Constitutional:  Negative.   HENT: Negative.    Eyes: Negative.   Respiratory: Negative.    Cardiovascular: Negative.   Gastrointestinal: Negative.   Endocrine: Negative.   Genitourinary: Negative.   Musculoskeletal: Negative.   Skin: Negative.   Allergic/Immunologic: Negative.   Neurological: Negative.   Hematological: Negative.   Psychiatric/Behavioral: Negative.         Stressed at work.       There were no vitals filed for this visit. There is no height or weight on file to calculate BMI.  Wt Readings from Last 3 Encounters:  07/01/22 176 lb (79.8 kg)  10/22/21 178 lb 12.8 oz (81.1 kg)  06/28/21 173 lb 6.4 oz (78.7 kg)    The 10-year ASCVD risk score (Arnett DK, et al., 2019) is: 3.7%   Values used to calculate the score:     Age: 44 years     Sex: Female     Is Non-Hispanic African American: Yes     Diabetic: No     Tobacco smoker: No     Systolic Blood Pressure: 120 mmHg     Is BP treated: Yes     HDL Cholesterol: 54 mg/dL     Total Cholesterol: 200 mg/dL  Objective:  Physical Exam Vitals reviewed.  Constitutional:      General: She is not in acute distress.    Appearance: Normal appearance. She is well-developed. She is obese.  HENT:     Head: Normocephalic and atraumatic.     Right Ear: Hearing, tympanic membrane, ear canal and external ear normal. There is no impacted cerumen.     Left Ear: Hearing, tympanic membrane, ear canal and external ear normal. There is no impacted cerumen.     Nose: Nose normal.     Mouth/Throat:     Mouth: Mucous membranes are moist.  Eyes:     General: Lids are normal.     Extraocular Movements: Extraocular movements intact.     Conjunctiva/sclera: Conjunctivae normal.     Pupils: Pupils are equal, round, and reactive to light.     Funduscopic exam:    Right eye: No papilledema.        Left eye: No papilledema.  Neck:     Thyroid: No thyroid mass.     Vascular: No carotid bruit.  Cardiovascular:     Rate and Rhythm: Normal rate and  regular rhythm.     Pulses: Normal pulses.     Heart sounds: Normal heart sounds. No murmur heard. Pulmonary:     Effort: Pulmonary effort is normal. No respiratory distress.     Breath sounds: Normal breath sounds. No wheezing.  Chest:     Chest wall: No mass.  Breasts:    Tanner Score is 5.     Right: Normal. No mass or tenderness.     Left: Normal. No mass or tenderness.  Abdominal:     General: Abdomen is flat. Bowel sounds are normal. There is no distension.     Palpations: Abdomen is soft.     Tenderness: There is no abdominal tenderness.  Genitourinary:    Comments: Deferred followed by GYN Musculoskeletal:        General: No swelling or tenderness. Normal range of motion.     Cervical back: Full passive range of motion without pain, normal range of motion and neck supple.     Right lower leg: No edema.     Left lower leg: No edema.  Lymphadenopathy:     Upper Body:     Right upper body: No supraclavicular, axillary or pectoral adenopathy.     Left upper body: No supraclavicular, axillary or pectoral adenopathy.  Skin:    General: Skin is warm and dry.     Capillary Refill: Capillary refill takes less than 2 seconds.  Neurological:     General: No focal deficit present.     Mental Status: She is alert and oriented to person, place, and time.     Cranial Nerves: No cranial nerve deficit.     Sensory: No sensory deficit.  Psychiatric:        Mood and Affect: Mood normal.        Behavior: Behavior normal.        Thought Content: Thought content normal.        Judgment: Judgment normal.         Assessment And Plan:  1. Encounter for annual health examination  2. Recurrent major depressive disorder, in full remission (HCC)  3. Elevated cholesterol  4. Essential hypertension  5. Abnormal kidney function    No follow-ups on file.  Patient was given opportunity to ask questions. Patient verbalized understanding of the plan and was able to repeat key elements of  the plan. All questions were answered to their satisfaction.  Arnette Felts, FNP  I, Arnette Felts, FNP, have reviewed all documentation for this visit. The documentation on 09/30/22 for the exam, diagnosis, procedures, and orders are all accurate and complete.   IF YOU HAVE BEEN REFERRED TO A SPECIALIST, IT MAY TAKE 1-2 WEEKS TO SCHEDULE/PROCESS THE REFERRAL. IF YOU HAVE NOT HEARD FROM US/SPECIALIST IN TWO WEEKS, PLEASE GIVE [place] A CALL AT [phone].

## 2022-10-01 LAB — CBC WITH DIFFERENTIAL/PLATELET
Basophils Absolute: 0 10*3/uL (ref 0.0–0.2)
Basos: 1 %
EOS (ABSOLUTE): 0.1 10*3/uL (ref 0.0–0.4)
Eos: 1 %
Hematocrit: 46 % (ref 34.0–46.6)
Hemoglobin: 15.6 g/dL (ref 11.1–15.9)
Immature Grans (Abs): 0 10*3/uL (ref 0.0–0.1)
Immature Granulocytes: 0 %
Lymphocytes Absolute: 1.7 10*3/uL (ref 0.7–3.1)
Lymphs: 31 %
MCH: 30.1 pg (ref 26.6–33.0)
MCHC: 33.9 g/dL (ref 31.5–35.7)
MCV: 89 fL (ref 79–97)
Monocytes Absolute: 0.4 10*3/uL (ref 0.1–0.9)
Monocytes: 8 %
Neutrophils Absolute: 3.3 10*3/uL (ref 1.4–7.0)
Neutrophils: 59 %
Platelets: 236 10*3/uL (ref 150–450)
RBC: 5.19 x10E6/uL (ref 3.77–5.28)
RDW: 12.6 % (ref 11.7–15.4)
WBC: 5.6 10*3/uL (ref 3.4–10.8)

## 2022-10-01 LAB — CMP14+EGFR
ALT: 17 IU/L (ref 0–32)
AST: 20 IU/L (ref 0–40)
Albumin: 4.7 g/dL (ref 3.8–4.9)
Alkaline Phosphatase: 95 IU/L (ref 44–121)
BUN/Creatinine Ratio: 13 (ref 9–23)
BUN: 13 mg/dL (ref 6–24)
Bilirubin Total: 0.4 mg/dL (ref 0.0–1.2)
CO2: 26 mmol/L (ref 20–29)
Calcium: 9.7 mg/dL (ref 8.7–10.2)
Chloride: 102 mmol/L (ref 96–106)
Creatinine, Ser: 0.99 mg/dL (ref 0.57–1.00)
Globulin, Total: 2.8 g/dL (ref 1.5–4.5)
Glucose: 90 mg/dL (ref 70–99)
Potassium: 3.9 mmol/L (ref 3.5–5.2)
Sodium: 142 mmol/L (ref 134–144)
Total Protein: 7.5 g/dL (ref 6.0–8.5)
eGFR: 68 mL/min/{1.73_m2} (ref >59)

## 2022-10-01 LAB — MICROALBUMIN / CREATININE URINE RATIO
Creatinine, Urine: 101.7 mg/dL
Microalb/Creat Ratio: 123 mg/g creat — ABNORMAL HIGH (ref 0–29)
Microalbumin, Urine: 124.7 ug/mL

## 2022-10-01 LAB — LIPID PANEL
Chol/HDL Ratio: 3.9 ratio (ref 0.0–4.4)
Cholesterol, Total: 208 mg/dL — ABNORMAL HIGH (ref 100–199)
HDL: 54 mg/dL (ref >39)
LDL Chol Calc (NIH): 138 mg/dL — ABNORMAL HIGH (ref 0–99)
Triglycerides: 90 mg/dL (ref 0–149)
VLDL Cholesterol Cal: 16 mg/dL (ref 5–40)

## 2022-10-02 LAB — URINE CULTURE

## 2022-10-10 ENCOUNTER — Encounter: Payer: Self-pay | Admitting: Nurse Practitioner

## 2022-10-10 DIAGNOSIS — N390 Urinary tract infection, site not specified: Secondary | ICD-10-CM | POA: Insufficient documentation

## 2022-10-10 DIAGNOSIS — Z23 Encounter for immunization: Secondary | ICD-10-CM | POA: Insufficient documentation

## 2022-10-10 DIAGNOSIS — Z0000 Encounter for general adult medical examination without abnormal findings: Secondary | ICD-10-CM | POA: Insufficient documentation

## 2022-10-10 DIAGNOSIS — R430 Anosmia: Secondary | ICD-10-CM | POA: Insufficient documentation

## 2022-10-10 DIAGNOSIS — Z8679 Personal history of other diseases of the circulatory system: Secondary | ICD-10-CM | POA: Insufficient documentation

## 2022-10-10 NOTE — Assessment & Plan Note (Signed)
Positive nitrates in urine we will treat with antibiotic and send urine for culture

## 2022-10-10 NOTE — Assessment & Plan Note (Signed)
Currently in full remission, no longer taking wellbutrin.

## 2022-10-10 NOTE — Assessment & Plan Note (Signed)
Will check lipid panel, diet controlled, encouraged to eat low fat diet

## 2022-10-10 NOTE — Assessment & Plan Note (Addendum)
Blood pressure is fairly controlled.  Continue low-salt diet.  Encouraged to exercise at least 30 minutes/day 5 days a week. EKG done with Sinus Bradycardia  -Left atrial enlargement. With HR 57

## 2022-10-10 NOTE — Assessment & Plan Note (Signed)
Patient reports previous history however I do not hear a murmur due to wanting to go to [employer] will have to see

## 2022-10-10 NOTE — Assessment & Plan Note (Signed)
Continued challenges with smell, will refer to ENT

## 2022-10-10 NOTE — Assessment & Plan Note (Signed)
Behavior modifications discussed and diet history reviewed.   Pt will continue to exercise regularly and modify diet with low GI, plant based foods and decrease intake of processed foods.  Recommend intake of daily multivitamin, Vitamin D, and calcium.  Recommend mammogram and colonoscopy for preventive screenings, as well as recommend immunizations that include influenza, TDAP, and Shingles  

## 2022-10-10 NOTE — Assessment & Plan Note (Signed)
2nd shingrix administered

## 2022-10-20 ENCOUNTER — Encounter: Payer: Self-pay | Admitting: Nurse Practitioner

## 2022-11-07 ENCOUNTER — Other Ambulatory Visit: Payer: Self-pay | Admitting: Nurse Practitioner

## 2022-11-07 DIAGNOSIS — I10 Essential (primary) hypertension: Secondary | ICD-10-CM

## 2022-11-16 ENCOUNTER — Other Ambulatory Visit: Payer: Self-pay | Admitting: Nurse Practitioner

## 2022-11-16 DIAGNOSIS — I10 Essential (primary) hypertension: Secondary | ICD-10-CM

## 2022-12-23 ENCOUNTER — Ambulatory Visit: Payer: Commercial Managed Care - PPO | Admitting: Podiatry

## 2022-12-23 ENCOUNTER — Ambulatory Visit (INDEPENDENT_AMBULATORY_CARE_PROVIDER_SITE_OTHER): Payer: Commercial Managed Care - PPO

## 2022-12-23 ENCOUNTER — Encounter: Payer: Self-pay | Admitting: Podiatry

## 2022-12-23 ENCOUNTER — Ambulatory Visit (INDEPENDENT_AMBULATORY_CARE_PROVIDER_SITE_OTHER): Payer: Commercial Managed Care - PPO | Admitting: Podiatry

## 2022-12-23 DIAGNOSIS — M205X2 Other deformities of toe(s) (acquired), left foot: Secondary | ICD-10-CM | POA: Diagnosis not present

## 2022-12-23 DIAGNOSIS — L989 Disorder of the skin and subcutaneous tissue, unspecified: Secondary | ICD-10-CM

## 2022-12-23 DIAGNOSIS — M205X1 Other deformities of toe(s) (acquired), right foot: Secondary | ICD-10-CM | POA: Diagnosis not present

## 2022-12-23 NOTE — Progress Notes (Signed)
Subjective:   Patient ID: Renee Li, female   DOB: 52 y.o.   MRN: [id number]   HPI No chief complaint on file.   52 year old female presents the office today with above concerns for concerns of pain to both of her big toes.  It has been getting worse over the last 2 months it started out 2 years ago.  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  No recent treatment or injuries.   Review of Systems  All other systems reviewed and are negative.  Past Medical History:  Diagnosis Date   Allergy    Anemia    Concussion 07/10/2016   Concussion with loss of consciousness 07/15/2016   Depression    Heart murmur    never caused any problems   Hypertension    Low back pain 06/14/2021   Migraines    Neck fullness 06/30/2021    Past Surgical History:  Procedure Laterality Date   APPENDECTOMY     WISDOM TOOTH EXTRACTION       Current Outpatient Medications:    atenolol (TENORMIN) 50 MG tablet, Take 1 tablet by mouth once daily, Disp: 90 tablet, Rfl: 0   hydrochlorothiazide (HYDRODIURIL) 12.5 MG tablet, Take 1 tablet by mouth once daily, Disp: 90 tablet, Rfl: 0  Allergies  Allergen Reactions   Aleve [Naproxen] Hives   Ibuprofen Hives, Itching and Rash           Objective:  Physical Exam  General: AAO x3, NAD  Dermatological: Hyperkeratotic lesions noted along the  lateral aspect of the fifth toes left side worse than right.  There is no underlying ulceration drainage or any signs of infection.  No open lesions.    Vascular: Dorsalis Pedis artery and Posterior Tibial artery pedal pulses are 2/4 bilateral with immedate capillary fill time. . There is no pain with calf compression, swelling, warmth, erythema.   Neruologic: Grossly intact via light touch bilateral.  Musculoskeletal: Adductovarus present of the fifth toe.  Upon weightbearing the fifth toe does not fully purchase the ground.   Tenderness to the fifth toes.  No other areas of discomfort.  Gait: Unassisted, Nonantalgic.       Assessment:   Adductovarus fifth toes with hyperkeratotic lesions     Plan:  -Treatment options discussed including all alternatives, risks, and complications -Etiology of symptoms were discussed -X-rays were obtained and reviewed with the patient. 3 views of the feet were obtained.  No acute fracture.  There is a varus is present. -We discussed the conservative as well as surgical treatment options.  She wants to continue with conservative treatment I agree with this to start.  Sharply debrided lesion without any complications or bleeding.  I cleaned the skin with alcohol and salicylic acid was applied followed by dressing.  Postprocedure instructions discussed.  Monitoring signs or symptoms of infection.  Dispensed offloading pads.  Discussed shoe modifications.  Vivi Barrack DPM

## 2022-12-23 NOTE — Patient Instructions (Signed)
Keep the bandage on for 24 hours. At that time, remove and clean with soap and water. If it hurts or burns before 24 hours go ahead and remove the bandage and wash with soap and water. Keep the area clean. If there is any blistering cover with antibiotic ointment and a bandage. Monitor for any redness, drainage, or other signs of infection. Call the office if any are to occur. If you have any questions, please call the office at [phone].  

## 2023-01-21 LAB — HM MAMMOGRAPHY

## 2023-01-22 ENCOUNTER — Encounter: Payer: Self-pay | Admitting: Internal Medicine

## 2023-02-20 ENCOUNTER — Other Ambulatory Visit: Payer: Self-pay | Admitting: Nurse Practitioner

## 2023-02-20 DIAGNOSIS — I10 Essential (primary) hypertension: Secondary | ICD-10-CM

## 2023-03-30 NOTE — Progress Notes (Signed)
Madelaine Bhat, CMA,acting as a Neurosurgeon for Arnette Felts, FNP.,have documented all relevant documentation on the behalf of Arnette Felts, FNP,as directed by  Arnette Felts, FNP while in the presence of Arnette Felts, FNP.  Subjective:  Patient ID: Renee Li , female    DOB: [birth year] , 52 y.o.   MRN: [id number]  Chief Complaint  Patient presents with   Hypertension    HPI  Patient presents today for a bp follow up, Patient reports compliance with medication. Patient denies any chest pain, SOB, or headaches. Patient has no concerns today. Renee Li continues to struggle with her weight, Renee Li is doing intermittent fasting - 8pm - 9a. Renee Li is doing a "bullet drink" that keeps her full. 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 Renee Li will go to the gym sometimes while flying. Renee Li walks approximately 12,000 steps a day.     Past Medical History:  Diagnosis Date   Allergy    Anemia    Concussion 07/10/2016   Concussion with loss of consciousness 07/15/2016   Depression    Heart murmur    never caused any problems   Hypertension    Low back pain 06/14/2021   Migraines    Neck fullness 06/30/2021    Family History  Problem Relation Age of Onset   Cancer Mother        throat   Depression Mother    Stroke Maternal Grandmother    Diabetes Maternal Grandmother    Cancer Maternal Grandfather        prostate   Heart disease Paternal Grandmother    Colon cancer Neg Hx    Rectal cancer Neg Hx    Stomach cancer Neg Hx    Esophageal cancer Neg Hx    Breast cancer Neg Hx      Current Outpatient Medications:    hydrochlorothiazide (HYDRODIURIL) 12.5 MG tablet, Take 1 tablet by mouth once daily, Disp: 90 tablet, Rfl: 2   atenolol (TENORMIN) 50 MG tablet, Take 1 tablet (50 mg total) by mouth daily., Disp: 90 tablet, Rfl: 1   Allergies  Allergen Reactions   Aleve [Naproxen] Hives   Ibuprofen Hives, Itching and Rash     Review of Systems   Constitutional: Negative.   Respiratory: Negative.    Cardiovascular: Negative.   Neurological: Negative.   Psychiatric/Behavioral: Negative.       Today's Vitals   03/31/23 1026  BP: 110/80  Pulse: 71  Temp: 99.3 F (37.4 C)  TempSrc: Oral  Weight: 178 lb 6.4 oz (80.9 kg)  Height: 5\' 3"  (1.6 m)  PainSc: 0-No pain   Body mass index is 31.6 kg/m.  Wt Readings from Last 3 Encounters:  03/31/23 178 lb 6.4 oz (80.9 kg)  09/30/22 180 lb 12.8 oz (82 kg)  07/01/22 176 lb (79.8 kg)    Objective:  Physical Exam Vitals reviewed. Exam conducted with a chaperone present.  Constitutional:      General: Renee Li is not in acute distress.    Appearance: Normal appearance.  Cardiovascular:     Rate and Rhythm: Normal rate and regular rhythm.     Pulses: Normal pulses.     Heart sounds: Normal heart sounds. No murmur heard. Pulmonary:     Effort: Pulmonary effort is normal. No respiratory distress.     Breath sounds: Normal breath sounds. No wheezing.  Genitourinary:    General: Normal vulva.     Exam position: Lithotomy position.  Tanner stage (genital): 5.     Labia:        Right: No rash.        Left: No rash.      Vagina: Vaginal discharge (white) present.     Cervix: Normal.     Uterus: Normal.      Adnexa: Right adnexa normal and left adnexa normal.     Rectum: Normal. Guaiac result negative.  Skin:    General: Skin is warm and dry.     Capillary Refill: Capillary refill takes less than 2 seconds.  Neurological:     General: No focal deficit present.     Mental Status: Renee Li is alert and oriented to person, place, and time.     Cranial Nerves: No cranial nerve deficit.     Motor: No weakness.  Psychiatric:        Mood and Affect: Mood normal.        Behavior: Behavior normal.        Thought Content: Thought content normal.        Judgment: Judgment normal.         Assessment And Plan:  Essential hypertension Assessment & Plan: Blood pressure is fairly  controlled.  Continue low-salt diet.  Encouraged to exercise at least 30 minutes/day 5 days a week and incorporate more strength training.   Orders: -     BMP8+eGFR -     Atenolol; Take 1 tablet (50 mg total) by mouth daily.  Dispense: 90 tablet; Refill: 1  Encounter for Papanicolaou smear of cervix -     Cytology - PAP  Influenza vaccination declined Assessment & Plan: Patient declined influenza vaccination at this time. Patient is aware that influenza vaccine prevents illness in 70% of healthy people, and reduces hospitalizations to 30-70% in elderly. This vaccine is recommended annually. Education has been provided regarding the importance of this vaccine but patient still declined. Advised may receive this vaccine at local pharmacy or Health Dept.or vaccine clinic. Aware to provide a copy of the vaccination record if obtained from local pharmacy or Health Dept.  Pt is willing to accept risk associated with refusing vaccination.    COVID-19 vaccination declined Assessment & Plan: Declines covid 19 vaccine. Discussed risk of covid 96 and if Renee Li changes her mind about the vaccine to call the office. Education has been provided regarding the importance of this vaccine but patient still declined. Advised may receive this vaccine at local pharmacy or Health Dept.or vaccine clinic. Aware to provide a copy of the vaccination record if obtained from local pharmacy or Health Dept.  Encouraged to take multivitamin, vitamin d, vitamin c and zinc to increase immune system. Aware can call office if would like to have vaccine here at office. Verbalized acceptance and understanding.    Screening for STDs (sexually transmitted diseases) -     NuSwab Vaginitis Plus (VG+) -     RPR -     HSV 1 and 2 Ab, IgG  Encounter for HIV (human immunodeficiency virus) test -     HIV Antibody (routine testing w rflx)   Return for keep same next.  Patient was given opportunity to ask questions. Patient verbalized  understanding of the plan and was able to repeat key elements of the plan. All questions were answered to their satisfaction.    Jeanell Sparrow, FNP, have reviewed all documentation for this visit. The documentation on 03/31/23 for the exam, diagnosis, procedures, and orders are all accurate and complete.  IF YOU HAVE BEEN REFERRED TO A SPECIALIST, IT MAY TAKE 1-2 WEEKS TO SCHEDULE/PROCESS THE REFERRAL. IF YOU HAVE NOT HEARD FROM US/SPECIALIST IN TWO WEEKS, PLEASE GIVE [place] A CALL AT [phone].

## 2023-03-31 ENCOUNTER — Ambulatory Visit (INDEPENDENT_AMBULATORY_CARE_PROVIDER_SITE_OTHER): Payer: Commercial Managed Care - PPO | Admitting: Nurse Practitioner

## 2023-03-31 ENCOUNTER — Other Ambulatory Visit (HOSPITAL_COMMUNITY)
Admission: RE | Admit: 2023-03-31 | Discharge: 2023-03-31 | Disposition: A | Discharge status: Home / Self Care | Payer: Commercial Managed Care - PPO | Source: Ambulatory Visit | Attending: Nurse Practitioner | Admitting: Nurse Practitioner

## 2023-03-31 ENCOUNTER — Encounter: Payer: Self-pay | Admitting: Nurse Practitioner

## 2023-03-31 VITALS — BP 110/80 | HR 71 | Temp 99.3°F | Ht 63.0 in | Wt 178.4 lb

## 2023-03-31 DIAGNOSIS — I10 Essential (primary) hypertension: Secondary | ICD-10-CM

## 2023-03-31 DIAGNOSIS — Z114 Encounter for screening for human immunodeficiency virus [HIV]: Secondary | ICD-10-CM

## 2023-03-31 DIAGNOSIS — Z113 Encounter for screening for infections with a predominantly sexual mode of transmission: Secondary | ICD-10-CM | POA: Diagnosis not present

## 2023-03-31 DIAGNOSIS — Z2821 Immunization not carried out because of patient refusal: Secondary | ICD-10-CM | POA: Insufficient documentation

## 2023-03-31 DIAGNOSIS — Z124 Encounter for screening for malignant neoplasm of cervix: Secondary | ICD-10-CM | POA: Insufficient documentation

## 2023-03-31 MED ORDER — ATENOLOL 50 MG PO TABS: 50.0000 mg | ORAL_TABLET | Freq: Every day | ORAL | 1 refills | Status: DC

## 2023-03-31 NOTE — Assessment & Plan Note (Addendum)

## 2023-03-31 NOTE — Assessment & Plan Note (Signed)

## 2023-03-31 NOTE — Assessment & Plan Note (Signed)
Blood pressure is fairly controlled.  Continue low-salt diet.  Encouraged to exercise at least 30 minutes/day 5 days a week and incorporate more strength training.

## 2023-04-01 LAB — RPR: RPR Ser Ql: NONREACTIVE

## 2023-04-01 LAB — BMP8+EGFR
BUN/Creatinine Ratio: 16 (ref 9–23)
BUN: 16 mg/dL (ref 6–24)
CO2: 22 mmol/L (ref 20–29)
Calcium: 9.5 mg/dL (ref 8.7–10.2)
Chloride: 105 mmol/L (ref 96–106)
Creatinine, Ser: 1 mg/dL (ref 0.57–1.00)
Glucose: 82 mg/dL (ref 70–99)
Potassium: 4 mmol/L (ref 3.5–5.2)
Sodium: 145 mmol/L — ABNORMAL HIGH (ref 134–144)
eGFR: 67 mL/min/{1.73_m2} (ref >59)

## 2023-04-01 LAB — HSV 1 AND 2 AB, IGG
HSV 1 Glycoprotein G Ab, IgG: NONREACTIVE
HSV 2 IgG, Type Spec: REACTIVE — AB

## 2023-04-01 LAB — HIV ANTIBODY (ROUTINE TESTING W REFLEX): HIV Screen 4th Generation wRfx: NONREACTIVE

## 2023-04-01 LAB — CYTOLOGY - PAP: Diagnosis: NEGATIVE

## 2023-04-02 LAB — NUSWAB VAGINITIS PLUS (VG+)
Candida albicans, NAA: NEGATIVE
Candida glabrata, NAA: NEGATIVE
Chlamydia trachomatis, NAA: NEGATIVE
Neisseria gonorrhoeae, NAA: NEGATIVE
Trich vag by NAA: NEGATIVE

## 2023-06-19 ENCOUNTER — Other Ambulatory Visit: Payer: Self-pay

## 2023-06-19 DIAGNOSIS — I10 Essential (primary) hypertension: Secondary | ICD-10-CM

## 2023-06-19 MED ORDER — ATENOLOL 50 MG PO TABS: 50.0000 mg | ORAL_TABLET | Freq: Every day | ORAL | 0 refills | Status: DC

## 2023-06-19 MED ORDER — HYDROCHLOROTHIAZIDE 12.5 MG PO TABS: 12.5000 mg | ORAL_TABLET | Freq: Every day | ORAL | 0 refills | Status: DC

## 2023-10-06 ENCOUNTER — Encounter: Payer: Self-pay | Admitting: Nurse Practitioner

## 2023-10-15 ENCOUNTER — Encounter: Payer: Self-pay | Admitting: Nurse Practitioner

## 2023-10-15 ENCOUNTER — Ambulatory Visit: Admitting: Nurse Practitioner

## 2023-10-15 VITALS — BP 130/78 | HR 69 | Temp 99.5°F | Ht 63.0 in | Wt 187.4 lb

## 2023-10-15 DIAGNOSIS — Z23 Encounter for immunization: Secondary | ICD-10-CM

## 2023-10-15 DIAGNOSIS — Z139 Encounter for screening, unspecified: Secondary | ICD-10-CM

## 2023-10-15 DIAGNOSIS — I10 Essential (primary) hypertension: Secondary | ICD-10-CM

## 2023-10-15 DIAGNOSIS — E6609 Other obesity due to excess calories: Secondary | ICD-10-CM

## 2023-10-15 DIAGNOSIS — E7800 Pure hypercholesterolemia, unspecified: Secondary | ICD-10-CM | POA: Diagnosis not present

## 2023-10-15 DIAGNOSIS — Z6833 Body mass index (BMI) 33.0-33.9, adult: Secondary | ICD-10-CM

## 2023-10-15 DIAGNOSIS — M25561 Pain in right knee: Secondary | ICD-10-CM

## 2023-10-15 DIAGNOSIS — E66811 Obesity, class 1: Secondary | ICD-10-CM | POA: Diagnosis not present

## 2023-10-15 MED ORDER — HYDROCHLOROTHIAZIDE 12.5 MG PO TABS: 12.5000 mg | ORAL_TABLET | Freq: Every day | ORAL | 1 refills | Status: DC

## 2023-10-15 MED ORDER — ATENOLOL 50 MG PO TABS: 50.0000 mg | ORAL_TABLET | Freq: Every day | ORAL | 1 refills | Status: DC

## 2023-10-15 NOTE — Patient Instructions (Signed)
 You can go to [address] to [facility]) for an xray

## 2023-10-15 NOTE — Progress Notes (Signed)
 LILLETTE Kristeen JINNY Gladis, CMA,acting as a Neurosurgeon for [name], FNP.,have documented all relevant documentation on the behalf of [name], FNP,as directed by  [name], FNP while in the presence of [name], FNP.  Subjective:  Patient ID: Renee Li , female    DOB: [birth year] , 53 y.o.   MRN: [id number]  Chief Complaint  Patient presents with   Hypertension    Patient presents today for a bp and chol follow up, Patient reports compliance with medication. Patient denies any chest pain, SOB, or headaches.    Knee Pain    Patient reports she has been having right knee pain for the past 2 weeks. She reports she would like to do a xray or MRI.     HPI  Here for BP f/u.  She is also having pain to her right knee for more than 2 weeks.  Knee Pain  The incident occurred more than 1 week ago. There was no injury mechanism. The pain is present in the right knee. The quality of the pain is described as aching (she has to walk around to get the knee to feel better). Pertinent negatives include no inability to bear weight. She reports no foreign bodies present. The symptoms are aggravated by movement. Treatments tried: voltaren gel.     Past Medical History:  Diagnosis Date   Allergy    Anemia    Concussion 07/10/2016   Concussion with loss of consciousness 07/15/2016   Depression    Heart murmur    never caused any problems   Hypertension    Low back pain 06/14/2021   Migraines    Neck fullness 06/30/2021     Family History  Problem Relation Age of Onset   Cancer Mother        throat   Depression Mother    Stroke Maternal Grandmother    Diabetes Maternal Grandmother    Cancer Maternal Grandfather        prostate   Heart disease Paternal Grandmother    Colon cancer Neg Hx    Rectal cancer Neg Hx    Stomach cancer Neg Hx    Esophageal cancer Neg Hx    Breast cancer Neg Hx      Current Outpatient Medications:    atenolol  (TENORMIN ) 50 MG tablet, Take 1 tablet  (50 mg total) by mouth daily., Disp: 90 tablet, Rfl: 1   hydrochlorothiazide  (HYDRODIURIL ) 12.5 MG tablet, Take 1 tablet (12.5 mg total) by mouth daily., Disp: 90 tablet, Rfl: 1   Allergies  Allergen Reactions   Aleve [Naproxen] Hives   Ibuprofen Hives, Itching and Rash     Review of Systems  Constitutional: Negative.   Respiratory: Negative.    Cardiovascular: Negative.   Musculoskeletal:        Right  knee pain  Neurological: Negative.   Psychiatric/Behavioral: Negative.       Today's Vitals   10/15/23 1526  BP: 130/78  Pulse: 69  Temp: 99.5 F (37.5 C)  TempSrc: Oral  Weight: 187 lb 6.4 oz (85 kg)  Height: 5' 3 (1.6 m)  PainSc: 4   PainLoc: Knee   Body mass index is 33.2 kg/m.  Wt Readings from Last 3 Encounters:  10/15/23 187 lb 6.4 oz (85 kg)  03/31/23 178 lb 6.4 oz (80.9 kg)  09/30/22 180 lb 12.8 oz (82 kg)     Objective:  Physical Exam Vitals and nursing note reviewed.  Constitutional:      General: She  is not in acute distress.    Appearance: Normal appearance. She is obese.  Cardiovascular:     Rate and Rhythm: Normal rate and regular rhythm.     Pulses: Normal pulses.     Heart sounds: Normal heart sounds. No murmur heard. Pulmonary:     Effort: Pulmonary effort is normal. No respiratory distress.     Breath sounds: Normal breath sounds. No wheezing.  Genitourinary:    Cervix: Normal.     Uterus: Normal.      Adnexa: Right adnexa normal and left adnexa normal.  Musculoskeletal:        General: Tenderness (right knee with movement) present. No swelling. Normal range of motion.  Skin:    General: Skin is warm and dry.     Capillary Refill: Capillary refill takes less than 2 seconds.  Neurological:     General: No focal deficit present.     Mental Status: She is alert and oriented to person, place, and time.     Cranial Nerves: No cranial nerve deficit.     Motor: No weakness.  Psychiatric:        Mood and Affect: Mood normal.         Behavior: Behavior normal.        Thought Content: Thought content normal.        Judgment: Judgment normal.      Assessment And Plan:  Essential hypertension Assessment & Plan: Blood pressure is fairly controlled.  Continue low-salt diet.  Encouraged to exercise at least 30 minutes/day 5 days a week and incorporate more strength training.   Orders: -     BMP8+eGFR -     Atenolol ; Take 1 tablet (50 mg total) by mouth daily.  Dispense: 90 tablet; Refill: 1 -     hydroCHLOROthiazide ; Take 1 tablet (12.5 mg total) by mouth daily.  Dispense: 90 tablet; Refill: 1  Elevated cholesterol Assessment & Plan: Will check lipid panel, diet controlled, encouraged to eat low fat diet  Orders: -     Lipid panel  COVID-19 vaccine administered Assessment & Plan: Covid 19 vaccine given in office observed for 15 minutes without any adverse reaction   Orders: Best boy Vaccine 106yrs & older  Acute pain of right knee Assessment & Plan: She has pain with range of motion, will send for xray. Advised to apply pain cream  Orders: -     DG Knee Complete 4 Views Right; Future  Class 1 obesity due to excess calories without serious comorbidity with body mass index (BMI) of 33.0 to 33.9 in adult Assessment & Plan: She is encouraged to strive for BMI less than 30 to decrease cardiac risk. Advised to aim for at least 150 minutes of exercise per week.    Encounter for screening -     Hepatitis B surface antibody,qualitative    Return for schedule HM in 6 months earlier if patient wants.  Patient was given opportunity to ask questions. Patient verbalized understanding of the plan and was able to repeat key elements of the plan. All questions were answered to their satisfaction.    LILLETTE [name], FNP, have reviewed all documentation for this visit. The documentation on 10/15/23 for the exam, diagnosis, procedures, and orders are all accurate and complete.   IF YOU HAVE  BEEN REFERRED TO A SPECIALIST, IT MAY TAKE 1-2 WEEKS TO SCHEDULE/PROCESS THE REFERRAL. IF YOU HAVE NOT HEARD FROM US /SPECIALIST IN TWO WEEKS, PLEASE GIVE  US  A CALL AT [phone] X [phone].

## 2023-10-16 LAB — BMP8+EGFR
BUN/Creatinine Ratio: 15 (ref 9–23)
BUN: 16 mg/dL (ref 6–24)
CO2: 27 mmol/L (ref 20–29)
Calcium: 9.8 mg/dL (ref 8.7–10.2)
Chloride: 100 mmol/L (ref 96–106)
Creatinine, Ser: 1.08 mg/dL — ABNORMAL HIGH (ref 0.57–1.00)
Glucose: 72 mg/dL (ref 70–99)
Potassium: 3.4 mmol/L — ABNORMAL LOW (ref 3.5–5.2)
Sodium: 142 mmol/L (ref 134–144)
eGFR: 61 mL/min/1.73 (ref >59)

## 2023-10-16 LAB — LIPID PANEL
Chol/HDL Ratio: 3.8 ratio (ref 0.0–4.4)
Cholesterol, Total: 206 mg/dL — ABNORMAL HIGH (ref 100–199)
HDL: 54 mg/dL (ref >39)
LDL Chol Calc (NIH): 135 mg/dL — ABNORMAL HIGH (ref 0–99)
Triglycerides: 95 mg/dL (ref 0–149)
VLDL Cholesterol Cal: 17 mg/dL (ref 5–40)

## 2023-10-16 LAB — HEPATITIS B SURFACE ANTIBODY,QUALITATIVE: Hep B Surface Ab, Qual: NONREACTIVE

## 2023-10-19 ENCOUNTER — Ambulatory Visit: Payer: Self-pay | Admitting: Nurse Practitioner

## 2023-10-19 ENCOUNTER — Ambulatory Visit
Admission: RE | Admit: 2023-10-19 | Discharge: 2023-10-19 | Disposition: A | Discharge status: Home / Self Care | Source: Ambulatory Visit | Attending: Nurse Practitioner | Admitting: Nurse Practitioner

## 2023-10-19 DIAGNOSIS — M25561 Pain in right knee: Secondary | ICD-10-CM

## 2023-10-26 DIAGNOSIS — E6609 Other obesity due to excess calories: Secondary | ICD-10-CM | POA: Insufficient documentation

## 2023-10-26 DIAGNOSIS — M25561 Pain in right knee: Secondary | ICD-10-CM | POA: Insufficient documentation

## 2023-10-26 DIAGNOSIS — Z23 Encounter for immunization: Secondary | ICD-10-CM | POA: Insufficient documentation

## 2023-10-26 NOTE — Assessment & Plan Note (Signed)
 She is encouraged to strive for BMI less than 30 to decrease cardiac risk. Advised to aim for at least 150 minutes of exercise per week.

## 2023-10-26 NOTE — Assessment & Plan Note (Signed)
 She has pain with range of motion, will send for xray. Advised to apply pain cream

## 2023-10-26 NOTE — Assessment & Plan Note (Signed)
 Covid 19 vaccine given in office observed for 15 minutes without any adverse reaction

## 2023-10-26 NOTE — Assessment & Plan Note (Signed)
Will check lipid panel, diet controlled, encouraged to eat low fat diet

## 2023-10-26 NOTE — Assessment & Plan Note (Signed)
 Blood pressure is fairly controlled.  Continue low-salt diet.  Encouraged to exercise at least 30 minutes/day 5 days a week and incorporate more strength training.

## 2023-10-27 ENCOUNTER — Other Ambulatory Visit: Payer: Self-pay | Admitting: Nurse Practitioner

## 2024-04-27 NOTE — Progress Notes (Signed)
 LILLETTE Kristeen JINNY Gladis, CMA,acting as a neurosurgeon for Renee Ada, FNP.,have documented all relevant documentation on the behalf of Renee Ada, FNP,as directed by  Renee Ada, FNP while in the presence of Renee Ada, FNP.  Subjective:    Patient ID: Renee Li , female    DOB: 1971-02-15 , 54 y.o.   MRN: [id number]  Chief Complaint  Patient presents with   Annual Exam    Patient presents today for HM, Patient reports compliance with medication. Patient denies any chest pain, SOB, or headaches. Patient has no concerns today.     HPI  Discussed the use of AI scribe software for clinical note transcription with the patient, who gave verbal consent to proceed.  History of Present Illness Renee Li is a 54 year old female who presents for a routine follow-up visit.  She is currently using an IUD, inserted in 2021, and is due for removal this year. She plans to contact Dr. Armond at [facility] for the removal.  She engages in intermittent fasting but has difficulty losing weight. 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 She occasionally exercises in her hotel room using YouTube videos.  She has a history of depression, which has impacted her decision to pursue flight school due to additional mental health requirements. She has consulted a specialist for flight aviation purposes but has decided against pursuing a pilot's license.  She received a flu shot today and is interested in getting a pneumonia shot, noting a past experience of illness after receiving two shots simultaneously.  She reports no current health concerns but mentions a delay in addressing knee and nose issues. She denies pain, swelling, constipation, diarrhea, urinary issues, or breast pain.  She has a history of osteopenia, identified during a bone density test following an arm fracture,  and inquires about the need for regular bone density testing.  Past Medical History:  Diagnosis Date   Allergy    Anemia    Concussion 07/10/2016   Concussion with loss of consciousness 07/15/2016   Depression    Heart murmur    never caused any problems   Hypertension    Low back pain 06/14/2021   Migraines    Neck fullness 06/30/2021     Family History  Problem Relation Age of Onset   Cancer Mother        throat   Depression Mother    Stroke Maternal Grandmother    Diabetes Maternal Grandmother    Cancer Maternal Grandfather        prostate   Heart disease Paternal Grandmother    Colon cancer Neg Hx    Rectal cancer Neg Hx    Stomach cancer Neg Hx    Esophageal cancer Neg Hx    Breast cancer Neg Hx     Current Medications[1]   Allergies[2]    The patient states she uses IUD for birth control. No LMP recorded. (Menstrual status: IUD).  Negative for Dysmenorrhea and Negative for Menorrhagia. Negative for: breast discharge, breast lump(s), breast pain and breast self exam. Associated symptoms include abnormal vaginal bleeding. Pertinent negatives include abnormal bleeding (hematology), anxiety, decreased libido, depression, difficulty falling sleep, dyspareunia, history of infertility, nocturia, sexual dysfunction, sleep disturbances, urinary incontinence, urinary urgency, vaginal discharge and vaginal itching. Diet regular; intermittent fasting. The patient states her exercise level is minimal but walks approximately 14,000  steps per day.   . The patient's tobacco use is: Tobacco Use History[3]. She has been exposed to passive smoke. The patient's alcohol use is:  Social History   Substance and Sexual Activity  Alcohol Use Yes   Alcohol/week: 1.0 standard drink of alcohol   Types: 1 Glasses of wine per week   Additional information: Last pap 03/31/2023, next one scheduled for 04/06/2026.    Review of Systems  Constitutional: Negative.   HENT: Negative.    Eyes:  Negative.   Respiratory: Negative.    Cardiovascular: Negative.   Gastrointestinal: Negative.   Endocrine: Negative.   Genitourinary: Negative.   Musculoskeletal: Negative.   Skin: Negative.   Allergic/Immunologic: Negative.   Neurological: Negative.   Hematological: Negative.   Psychiatric/Behavioral: Negative.       Today's Vitals   04/28/24 1405  BP: 120/80  Pulse: (!) 59  Temp: 98.8 F (37.1 C)  TempSrc: Oral  Weight: 190 lb (86.2 kg)  Height: 5' 3 (1.6 m)  PainSc: 7   PainLoc: Knee   Body mass index is 33.66 kg/m.  Wt Readings from Last 3 Encounters:  04/28/24 190 lb (86.2 kg)  10/15/23 187 lb 6.4 oz (85 kg)  03/31/23 178 lb 6.4 oz (80.9 kg)     Objective:  Physical Exam Vitals and nursing note reviewed.  Constitutional:      General: She is not in acute distress.    Appearance: Normal appearance. She is well-developed. She is obese.  HENT:     Head: Normocephalic and atraumatic.     Right Ear: Hearing, tympanic membrane, ear canal and external ear normal. There is no impacted cerumen.     Left Ear: Hearing, tympanic membrane, ear canal and external ear normal. There is no impacted cerumen.     Nose: Nose normal.     Mouth/Throat:     Mouth: Mucous membranes are moist.  Eyes:     General: Lids are normal.     Extraocular Movements: Extraocular movements intact.     Conjunctiva/sclera: Conjunctivae normal.     Pupils: Pupils are equal, round, and reactive to light.     Funduscopic exam:    Right eye: No papilledema.        Left eye: No papilledema.  Neck:     Thyroid: No thyroid mass.     Vascular: No carotid bruit.  Cardiovascular:     Rate and Rhythm: Normal rate and regular rhythm.     Pulses: Normal pulses.     Heart sounds: Normal heart sounds. No murmur heard. Pulmonary:     Effort: Pulmonary effort is normal. No respiratory distress.     Breath sounds: Normal breath sounds. No wheezing.  Chest:     Chest wall: No mass.  Breasts:     Tanner Score is 5.     Right: Normal. No mass or tenderness.     Left: Normal. No mass or tenderness.  Abdominal:     General: Abdomen is flat. Bowel sounds are normal. There is no distension.     Palpations: Abdomen is soft.     Tenderness: There is no abdominal tenderness.  Genitourinary:    Comments: Deferred followed by GYN Musculoskeletal:        General: No swelling or tenderness. Normal range of motion.     Cervical back: Full passive range of motion without pain, normal range of motion and neck supple.     Right lower leg: No edema.  Left lower leg: No edema.  Lymphadenopathy:     Upper Body:     Right upper body: No supraclavicular, axillary or pectoral adenopathy.     Left upper body: No supraclavicular, axillary or pectoral adenopathy.  Skin:    General: Skin is warm and dry.     Capillary Refill: Capillary refill takes less than 2 seconds.  Neurological:     General: No focal deficit present.     Mental Status: She is alert and oriented to person, place, and time.     Cranial Nerves: No cranial nerve deficit.     Sensory: No sensory deficit.  Psychiatric:        Mood and Affect: Mood normal.        Behavior: Behavior normal.        Thought Content: Thought content normal.        Judgment: Judgment normal.         Assessment And Plan:     Encounter for annual health examination Assessment & Plan: Routine wellness visit with no acute issues. Discussed weight management, exercise, and intermittent fasting. Reviewed immunizations, including flu and pneumonia vaccines. - Encouraged increased physical activity for weight management. - Will schedule IUD removal with Dr. Armond. - Will schedule Pap smear for 2026. - Will administer pneumonia vaccine if desired.   Essential hypertension Assessment & Plan: Blood pressure is fairly controlled.  Continue low-salt diet.  Encouraged to exercise at least 30 minutes/day 5 days a week and incorporate more strength  training.   Orders: -     EKG 12-Lead -     Microalbumin / creatinine urine ratio -     CMP14+EGFR -     Urinalysis, Complete  Elevated cholesterol Assessment & Plan: Will check lipid panel, diet controlled, encouraged to eat low fat diet  Orders: -     Lipid panel  Osteopenia of lumbar spine Assessment & Plan: Diagnosed in 2023. Discussed need for bone density monitoring and consideration of menopausal status affecting bone health. - Ordered bone density scan due to osteopenia.  Orders: -     DG Bone Density; Future  Need for influenza vaccination -     Flu vaccine trivalent PF, 6mos and older(Flulaval,Afluria,Fluarix,Fluzone)  Class 1 obesity due to excess calories without serious comorbidity with body mass index (BMI) of 33.0 to 33.9 in adult Assessment & Plan: Weight increased by three pounds over six months. Discussed challenges with weight loss despite intermittent fasting and exercise. Emphasized importance of increasing physical activity. - Encouraged increased physical activity to aid in weight loss.   Other long term (current) drug therapy -     CBC with Differential/Platelet  Encounter for screening for metabolic disorder -     Hemoglobin A1c  Other orders -     Microscopic Examination     Return for 1 year physical, 6 month bp check.  Patient was given opportunity to ask questions. Patient verbalized understanding of the plan and was able to repeat key elements of the plan. All questions were answered to their satisfaction.   Renee Ada, FNP  I, Renee Ada, FNP, have reviewed all documentation for this visit. The documentation on 04/28/24 for the exam, diagnosis, procedures, and orders are all accurate and complete.      [1]  Current Outpatient Medications:    atenolol  (TENORMIN ) 50 MG tablet, Take 1 tablet by mouth once daily, Disp: 90 tablet, Rfl: 0   hydrochlorothiazide  (HYDRODIURIL ) 12.5 MG tablet, Take 1 tablet by  mouth once daily, Disp:  90 tablet, Rfl: 0 [2]  Allergies Allergen Reactions   Aleve [Naproxen] Hives   Ibuprofen Hives, Itching and Rash  [3]  Social History Tobacco Use  Smoking Status Never  Smokeless Tobacco Never

## 2024-04-28 ENCOUNTER — Encounter: Payer: Self-pay | Admitting: Nurse Practitioner

## 2024-04-28 ENCOUNTER — Other Ambulatory Visit: Payer: Self-pay | Admitting: Nurse Practitioner

## 2024-04-28 ENCOUNTER — Ambulatory Visit: Payer: Self-pay | Admitting: Nurse Practitioner

## 2024-04-28 VITALS — BP 120/80 | HR 59 | Temp 98.8°F | Ht 63.0 in | Wt 190.0 lb

## 2024-04-28 DIAGNOSIS — M8588 Other specified disorders of bone density and structure, other site: Secondary | ICD-10-CM | POA: Diagnosis not present

## 2024-04-28 DIAGNOSIS — E6609 Other obesity due to excess calories: Secondary | ICD-10-CM

## 2024-04-28 DIAGNOSIS — Z13228 Encounter for screening for other metabolic disorders: Secondary | ICD-10-CM

## 2024-04-28 DIAGNOSIS — Z0000 Encounter for general adult medical examination without abnormal findings: Secondary | ICD-10-CM

## 2024-04-28 DIAGNOSIS — E66811 Obesity, class 1: Secondary | ICD-10-CM | POA: Diagnosis not present

## 2024-04-28 DIAGNOSIS — E7800 Pure hypercholesterolemia, unspecified: Secondary | ICD-10-CM

## 2024-04-28 DIAGNOSIS — Z23 Encounter for immunization: Secondary | ICD-10-CM | POA: Diagnosis not present

## 2024-04-28 DIAGNOSIS — Z6833 Body mass index (BMI) 33.0-33.9, adult: Secondary | ICD-10-CM | POA: Diagnosis not present

## 2024-04-28 DIAGNOSIS — I10 Essential (primary) hypertension: Secondary | ICD-10-CM | POA: Diagnosis not present

## 2024-04-28 DIAGNOSIS — Z79899 Other long term (current) drug therapy: Secondary | ICD-10-CM

## 2024-04-28 NOTE — Assessment & Plan Note (Signed)
Will check lipid panel, diet controlled, encouraged to eat low fat diet

## 2024-04-28 NOTE — Patient Instructions (Signed)
 Dr. Ovid All is the GYN provider

## 2024-04-28 NOTE — Assessment & Plan Note (Signed)
 Blood pressure is fairly controlled.  Continue low-salt diet.  Encouraged to exercise at least 30 minutes/day 5 days a week and incorporate more strength training.

## 2024-04-28 NOTE — Assessment & Plan Note (Signed)
 Routine wellness visit with no acute issues. Discussed weight management, exercise, and intermittent fasting. Reviewed immunizations, including flu and pneumonia vaccines. - Encouraged increased physical activity for weight management. - Will schedule IUD removal with Dr. Armond. - Will schedule Pap smear for 2026. - Will administer pneumonia vaccine if desired.

## 2024-04-28 NOTE — Assessment & Plan Note (Addendum)
 Diagnosed in 2023. Discussed need for bone density monitoring and consideration of menopausal status affecting bone health. - Ordered bone density scan due to osteopenia.

## 2024-04-28 NOTE — Assessment & Plan Note (Signed)
 Weight increased by three pounds over six months. Discussed challenges with weight loss despite intermittent fasting and exercise. Emphasized importance of increasing physical activity. - Encouraged increased physical activity to aid in weight loss.

## 2024-04-29 LAB — CMP14+EGFR
ALT: 11 IU/L (ref 0–32)
AST: 18 IU/L (ref 0–40)
Albumin: 4.5 g/dL (ref 3.8–4.9)
Alkaline Phosphatase: 90 IU/L (ref 49–135)
BUN/Creatinine Ratio: 16 (ref 9–23)
BUN: 16 mg/dL (ref 6–24)
Bilirubin Total: 0.3 mg/dL (ref 0.0–1.2)
CO2: 24 mmol/L (ref 20–29)
Calcium: 9.9 mg/dL (ref 8.7–10.2)
Chloride: 102 mmol/L (ref 96–106)
Creatinine, Ser: 1.02 mg/dL — ABNORMAL HIGH (ref 0.57–1.00)
Globulin, Total: 2.7 g/dL (ref 1.5–4.5)
Glucose: 78 mg/dL (ref 70–99)
Potassium: 4.3 mmol/L (ref 3.5–5.2)
Sodium: 143 mmol/L (ref 134–144)
Total Protein: 7.2 g/dL (ref 6.0–8.5)
eGFR: 65 mL/min/1.73

## 2024-04-29 LAB — URINALYSIS, COMPLETE
Bilirubin, UA: NEGATIVE
Glucose, UA: NEGATIVE
Ketones, UA: NEGATIVE
Nitrite, UA: POSITIVE — AB
Protein,UA: NEGATIVE
Specific Gravity, UA: 1.016 (ref 1.005–1.030)
Urobilinogen, Ur: 0.2 mg/dL (ref 0.2–1.0)
pH, UA: 5.5 (ref 5.0–7.5)

## 2024-04-29 LAB — CBC WITH DIFFERENTIAL/PLATELET
Basophils Absolute: 0 x10E3/uL (ref 0.0–0.2)
Basos: 0 %
EOS (ABSOLUTE): 0.1 x10E3/uL (ref 0.0–0.4)
Eos: 1 %
Hematocrit: 47.8 % — ABNORMAL HIGH (ref 34.0–46.6)
Hemoglobin: 15.2 g/dL (ref 11.1–15.9)
Immature Grans (Abs): 0 x10E3/uL (ref 0.0–0.1)
Immature Granulocytes: 0 %
Lymphocytes Absolute: 2.3 x10E3/uL (ref 0.7–3.1)
Lymphs: 34 %
MCH: 29.1 pg (ref 26.6–33.0)
MCHC: 31.8 g/dL (ref 31.5–35.7)
MCV: 91 fL (ref 79–97)
Monocytes Absolute: 0.7 x10E3/uL (ref 0.1–0.9)
Monocytes: 11 %
Neutrophils Absolute: 3.7 x10E3/uL (ref 1.4–7.0)
Neutrophils: 54 %
Platelets: 250 x10E3/uL (ref 150–450)
RBC: 5.23 x10E6/uL (ref 3.77–5.28)
RDW: 13.1 % (ref 11.7–15.4)
WBC: 6.9 x10E3/uL (ref 3.4–10.8)

## 2024-04-29 LAB — LIPID PANEL
Chol/HDL Ratio: 4.1 ratio (ref 0.0–4.4)
Cholesterol, Total: 201 mg/dL — ABNORMAL HIGH (ref 100–199)
HDL: 49 mg/dL
LDL Chol Calc (NIH): 134 mg/dL — ABNORMAL HIGH (ref 0–99)
Triglycerides: 102 mg/dL (ref 0–149)
VLDL Cholesterol Cal: 18 mg/dL (ref 5–40)

## 2024-04-29 LAB — MICROALBUMIN / CREATININE URINE RATIO
Creatinine, Urine: 76.8 mg/dL
Microalb/Creat Ratio: 30 mg/g{creat} — ABNORMAL HIGH (ref 0–29)
Microalbumin, Urine: 22.7 ug/mL

## 2024-04-29 LAB — MICROSCOPIC EXAMINATION
Casts: NONE SEEN /LPF
RBC, Urine: NONE SEEN /HPF (ref 0–2)

## 2024-04-29 LAB — HEMOGLOBIN A1C
Est. average glucose Bld gHb Est-mCnc: 117 mg/dL
Hgb A1c MFr Bld: 5.7 % — ABNORMAL HIGH (ref 4.8–5.6)

## 2024-04-29 LAB — HM DEXA SCAN

## 2024-05-02 ENCOUNTER — Encounter: Payer: Self-pay | Admitting: Nurse Practitioner

## 2024-05-03 ENCOUNTER — Ambulatory Visit: Payer: Self-pay | Admitting: Nurse Practitioner

## 2024-05-12 ENCOUNTER — Other Ambulatory Visit: Payer: Self-pay

## 2024-05-12 DIAGNOSIS — N390 Urinary tract infection, site not specified: Secondary | ICD-10-CM

## 2024-05-13 ENCOUNTER — Other Ambulatory Visit: Payer: Self-pay

## 2024-05-13 ENCOUNTER — Other Ambulatory Visit: Payer: Self-pay | Admitting: Nurse Practitioner

## 2024-05-13 DIAGNOSIS — N390 Urinary tract infection, site not specified: Secondary | ICD-10-CM

## 2024-05-13 MED ORDER — NITROFURANTOIN MONOHYD MACRO 100 MG PO CAPS: 100.0000 mg | ORAL_CAPSULE | Freq: Two times a day (BID) | ORAL | 0 refills | Status: AC

## 2024-05-17 LAB — URINE CULTURE

## 2024-05-20 ENCOUNTER — Encounter: Payer: Self-pay | Admitting: Nurse Practitioner

## 2024-10-26 ENCOUNTER — Ambulatory Visit: Payer: Self-pay | Admitting: Nurse Practitioner

## 2025-05-04 ENCOUNTER — Encounter: Payer: Self-pay | Admitting: Nurse Practitioner
